# Patient Record
Sex: Female | Born: 1955 | Race: Black or African American | Hispanic: No | Marital: Married | Smoking: Former smoker
Health system: Southern US, Community
[De-identification: ages and names within clinical notes are randomized; demographics above are authoritative.]

## PROBLEM LIST (undated history)

## (undated) DIAGNOSIS — M1990 Unspecified osteoarthritis, unspecified site: Secondary | ICD-10-CM

## (undated) DIAGNOSIS — D509 Iron deficiency anemia, unspecified: Secondary | ICD-10-CM

## (undated) DIAGNOSIS — R519 Headache, unspecified: Secondary | ICD-10-CM

## (undated) DIAGNOSIS — G4730 Sleep apnea, unspecified: Secondary | ICD-10-CM

## (undated) DIAGNOSIS — R51 Headache: Secondary | ICD-10-CM

## (undated) DIAGNOSIS — I10 Essential (primary) hypertension: Secondary | ICD-10-CM

## (undated) DIAGNOSIS — F419 Anxiety disorder, unspecified: Secondary | ICD-10-CM

## (undated) DIAGNOSIS — E669 Obesity, unspecified: Secondary | ICD-10-CM

## (undated) HISTORY — PX: JOINT REPLACEMENT: SHX530

## (undated) HISTORY — PX: ABDOMINAL HYSTERECTOMY: SHX81

## (undated) HISTORY — PX: CHOLECYSTECTOMY: SHX55

## (undated) HISTORY — DX: Iron deficiency anemia, unspecified: D50.9

## (undated) HISTORY — PX: GASTRIC BYPASS: SHX52

## (undated) HISTORY — PX: COLONOSCOPY WITH ESOPHAGOGASTRODUODENOSCOPY (EGD): SHX5779

## (undated) HISTORY — PX: OTHER SURGICAL HISTORY: SHX169

## (undated) HISTORY — PX: DILATION AND CURETTAGE OF UTERUS: SHX78

---

## 1987-05-13 HISTORY — PX: BREAST EXCISIONAL BIOPSY: SUR124

## 2010-03-19 ENCOUNTER — Ambulatory Visit: Payer: Self-pay

## 2010-12-16 ENCOUNTER — Ambulatory Visit: Payer: Self-pay | Admitting: Family Medicine

## 2011-01-11 ENCOUNTER — Ambulatory Visit: Payer: Self-pay | Admitting: Family Medicine

## 2012-09-28 ENCOUNTER — Ambulatory Visit: Payer: Self-pay

## 2013-09-05 ENCOUNTER — Ambulatory Visit: Payer: Self-pay | Admitting: Obstetrics and Gynecology

## 2013-09-30 DIAGNOSIS — M1990 Unspecified osteoarthritis, unspecified site: Secondary | ICD-10-CM | POA: Insufficient documentation

## 2013-09-30 DIAGNOSIS — M545 Low back pain, unspecified: Secondary | ICD-10-CM | POA: Insufficient documentation

## 2013-10-13 ENCOUNTER — Ambulatory Visit: Payer: Self-pay | Admitting: Orthopedic Surgery

## 2014-01-03 ENCOUNTER — Ambulatory Visit: Payer: Self-pay | Admitting: Specialist

## 2014-01-24 ENCOUNTER — Ambulatory Visit: Payer: Self-pay | Admitting: Gastroenterology

## 2014-01-26 LAB — PATHOLOGY REPORT

## 2015-01-01 DIAGNOSIS — K5731 Diverticulosis of large intestine without perforation or abscess with bleeding: Secondary | ICD-10-CM | POA: Insufficient documentation

## 2015-05-30 ENCOUNTER — Other Ambulatory Visit: Payer: Self-pay | Admitting: Specialist

## 2015-06-26 ENCOUNTER — Ambulatory Visit
Admission: RE | Admit: 2015-06-26 | Discharge: 2015-06-26 | Disposition: A | Discharge status: Home / Self Care | Payer: Medicare Other | Source: Ambulatory Visit | Attending: Specialist | Admitting: Specialist

## 2015-06-26 DIAGNOSIS — Z01818 Encounter for other preprocedural examination: Secondary | ICD-10-CM | POA: Diagnosis not present

## 2015-06-26 DIAGNOSIS — K8020 Calculus of gallbladder without cholecystitis without obstruction: Secondary | ICD-10-CM | POA: Insufficient documentation

## 2015-07-04 DIAGNOSIS — M170 Bilateral primary osteoarthritis of knee: Secondary | ICD-10-CM | POA: Insufficient documentation

## 2015-07-24 DIAGNOSIS — K8020 Calculus of gallbladder without cholecystitis without obstruction: Secondary | ICD-10-CM | POA: Insufficient documentation

## 2015-07-24 DIAGNOSIS — I10 Essential (primary) hypertension: Secondary | ICD-10-CM | POA: Insufficient documentation

## 2015-07-24 DIAGNOSIS — G4733 Obstructive sleep apnea (adult) (pediatric): Secondary | ICD-10-CM | POA: Insufficient documentation

## 2015-12-13 ENCOUNTER — Encounter: Payer: Self-pay | Admitting: *Deleted

## 2015-12-14 ENCOUNTER — Encounter: Payer: Self-pay | Admitting: *Deleted

## 2015-12-14 ENCOUNTER — Ambulatory Visit
Admission: RE | Admit: 2015-12-14 | Discharge: 2015-12-14 | Disposition: A | Discharge status: Home / Self Care | Payer: Medicare Other | Source: Ambulatory Visit | Attending: Unknown Physician Specialty | Admitting: Unknown Physician Specialty

## 2015-12-14 ENCOUNTER — Encounter
Admission: RE | Disposition: A | Discharge status: Home / Self Care | Payer: Self-pay | Source: Ambulatory Visit | Attending: Unknown Physician Specialty

## 2015-12-14 ENCOUNTER — Ambulatory Visit: Payer: Medicare Other | Admitting: Anesthesiology

## 2015-12-14 DIAGNOSIS — G4730 Sleep apnea, unspecified: Secondary | ICD-10-CM | POA: Insufficient documentation

## 2015-12-14 DIAGNOSIS — Z87891 Personal history of nicotine dependence: Secondary | ICD-10-CM | POA: Diagnosis not present

## 2015-12-14 DIAGNOSIS — I10 Essential (primary) hypertension: Secondary | ICD-10-CM | POA: Diagnosis not present

## 2015-12-14 DIAGNOSIS — Z79899 Other long term (current) drug therapy: Secondary | ICD-10-CM | POA: Insufficient documentation

## 2015-12-14 DIAGNOSIS — K2960 Other gastritis without bleeding: Secondary | ICD-10-CM | POA: Diagnosis not present

## 2015-12-14 DIAGNOSIS — M1990 Unspecified osteoarthritis, unspecified site: Secondary | ICD-10-CM | POA: Diagnosis not present

## 2015-12-14 HISTORY — PX: ESOPHAGOGASTRODUODENOSCOPY (EGD) WITH PROPOFOL: SHX5813

## 2015-12-14 HISTORY — DX: Sleep apnea, unspecified: G47.30

## 2015-12-14 HISTORY — DX: Unspecified osteoarthritis, unspecified site: M19.90

## 2015-12-14 HISTORY — DX: Essential (primary) hypertension: I10

## 2015-12-14 HISTORY — DX: Obesity, unspecified: E66.9

## 2015-12-14 HISTORY — DX: Headache: R51

## 2015-12-14 HISTORY — DX: Headache, unspecified: R51.9

## 2015-12-14 SURGERY — ESOPHAGOGASTRODUODENOSCOPY (EGD) WITH PROPOFOL
Anesthesia: General

## 2015-12-14 MED ORDER — MIDAZOLAM HCL 5 MG/5ML IJ SOLN
INTRAMUSCULAR | Status: DC | PRN
  Administered 2015-12-14: 1 mg via INTRAVENOUS

## 2015-12-14 MED ORDER — SODIUM CHLORIDE 0.9 % IV SOLN
INTRAVENOUS | Status: DC
  Administered 2015-12-14: 1000 mL via INTRAVENOUS

## 2015-12-14 MED ORDER — LIDOCAINE 2% (20 MG/ML) 5 ML SYRINGE
INTRAMUSCULAR | Status: DC | PRN
  Administered 2015-12-14: 40 mg via INTRAVENOUS

## 2015-12-14 MED ORDER — FENTANYL CITRATE (PF) 100 MCG/2ML IJ SOLN
INTRAMUSCULAR | Status: DC | PRN
  Administered 2015-12-14: 50 ug via INTRAVENOUS

## 2015-12-14 MED ORDER — SODIUM CHLORIDE 0.9 % IV SOLN: INTRAVENOUS | Status: DC

## 2015-12-14 MED ORDER — PROPOFOL 500 MG/50ML IV EMUL
INTRAVENOUS | Status: DC | PRN
  Administered 2015-12-14: 150 ug/kg/min via INTRAVENOUS

## 2015-12-14 MED ORDER — PROPOFOL 10 MG/ML IV BOLUS
INTRAVENOUS | Status: DC | PRN
  Administered 2015-12-14: 100 mg via INTRAVENOUS

## 2015-12-14 NOTE — H&P (Signed)
Primary Care Physician:  Barbette Reichmann, MD Primary Gastroenterologist:  Dr. Mechele Collin  Pre-Procedure History & Physical: HPI:  Christine Reyes is a 60 y.o. female is here for an endoscopy.   Past Medical History:  Diagnosis Date  . Arthritis   . Headache   . Hypertension   . Obesity   . Sleep apnea     Past Surgical History:  Procedure Laterality Date  . ABDOMINAL HYSTERECTOMY    . COLONOSCOPY WITH ESOPHAGOGASTRODUODENOSCOPY (EGD)    . csection x2    . DILATION AND CURETTAGE OF UTERUS    . left knee surgery      Prior to Admission medications   Medication Sig Start Date End Date Taking? Authorizing Provider  cyclobenzaprine (FLEXERIL) 5 MG tablet Take 5 mg by mouth 3 (three) times daily as needed for muscle spasms.   Yes Historical Provider, MD  diclofenac sodium (VOLTAREN) 1 % GEL Apply 2 g topically 2 (two) times daily.   Yes Historical Provider, MD  ergocalciferol (VITAMIN D2) 50000 units capsule Take 50,000 Units by mouth once a week.   Yes Historical Provider, MD  ferrous sulfate 325 (65 FE) MG tablet Take 325 mg by mouth daily with breakfast.   Yes Historical Provider, MD  gabapentin (NEURONTIN) 300 MG capsule Take 300 mg by mouth 3 (three) times daily.   Yes Historical Provider, MD  ibuprofen (ADVIL,MOTRIN) 200 MG tablet Take 800 mg by mouth every 6 (six) hours as needed.   Yes Historical Provider, MD  olmesartan (BENICAR) 20 MG tablet Take 20 mg by mouth daily.   Yes Historical Provider, MD  pantoprazole (PROTONIX) 40 MG tablet Take 40 mg by mouth daily.   Yes Historical Provider, MD  predniSONE (DELTASONE) 10 MG tablet Take 10 mg by mouth daily with breakfast.   Yes Historical Provider, MD    Allergies as of 12/11/2015  . (Not on File)    History reviewed. No pertinent family history.  Social History   Social History  . Marital status: Married    Spouse name: N/A  . Number of children: N/A  . Years of education: N/A   Occupational History  . Not on  file.   Social History Main Topics  . Smoking status: Former Smoker    Packs/day: 0.50  . Smokeless tobacco: Former Neurosurgeon  . Alcohol use No  . Drug use: No  . Sexual activity: Not on file   Other Topics Concern  . Not on file   Social History Narrative  . No narrative on file    Review of Systems: See HPI, otherwise negative ROS  Physical Exam: BP (!) 181/102   Pulse (!) 101   Temp 98 F (36.7 C) (Tympanic)   Resp 20   Ht  (1.499 m)   Wt 115.7 kg (255 lb)   SpO2 97%   BMI 51.50 kg/m  General:   Alert,  pleasant and cooperative in NAD Head:  Normocephalic and atraumatic. Neck:  Supple; no masses or thyromegaly. Lungs:  Clear throughout to auscultation.    Heart:  Regular rate and rhythm. Abdomen:  Soft, nontender and nondistended. Normal bowel sounds, without guarding, and without rebound.   Neurologic:  Alert and  oriented x4;  grossly normal neurologically.  Impression/Plan: Christine Reyes is here for an endoscopy to be performed for bariatric surgery pre op evaluation  Risks, benefits, limitations, and alternatives regarding  endoscopy have been reviewed with the patient.  Questions have been answered.  All  parties agreeable.   Lynnae Prude, MD  12/14/2015, 11:21 AM

## 2015-12-14 NOTE — Anesthesia Preprocedure Evaluation (Signed)
Anesthesia Evaluation  Patient identified by MRN, date of birth, ID band Patient awake    Reviewed: Allergy & Precautions, NPO status , Unable to perform ROS - Chart review only  Airway Mallampati: III       Dental  (+) Missing   Pulmonary sleep apnea and Continuous Positive Airway Pressure Ventilation , former smoker,     + decreased breath sounds      Cardiovascular hypertension,  Rhythm:Regular     Neuro/Psych    GI/Hepatic negative GI ROS, Neg liver ROS,   Endo/Other  Morbid obesity  Renal/GU negative Renal ROS     Musculoskeletal   Abdominal (+) + obese,   Peds  Hematology negative hematology ROS (+)   Anesthesia Other Findings   Reproductive/Obstetrics                             Anesthesia Physical Anesthesia Plan  ASA: III  Anesthesia Plan: General   Post-op Pain Management:    Induction: Intravenous  Airway Management Planned: Natural Airway and Nasal Cannula  Additional Equipment:   Intra-op Plan:   Post-operative Plan:   Informed Consent: I have reviewed the patients History and Physical, chart, labs and discussed the procedure including the risks, benefits and alternatives for the proposed anesthesia with the patient or authorized representative who has indicated his/her understanding and acceptance.     Plan Discussed with: CRNA  Anesthesia Plan Comments:         Anesthesia Quick Evaluation

## 2015-12-14 NOTE — Op Note (Signed)
[facility] Gastroenterology Patient Name: Christine Reyes Procedure Date: 12/14/2015 11:20 AM MRN: [id number] Account #: [id number] Date of Birth: 1956-05-09 Admit Type: Outpatient Age: 60 Room: [facility] ENDO ROOM 1 Gender: Female Note Status: Finalized Procedure:            Upper GI endoscopy Indications:          Preoperative assessment for bariatric surgery to treat                        morbid obesity Providers:            Scot Jun, MD Referring MD:         Barbette Reichmann, MD (Referring MD) Medicines:            Propofol per Anesthesia Complications:        No immediate complications. Procedure:            Pre-Anesthesia Assessment:                       - After reviewing the risks and benefits, the patient                        was deemed in satisfactory condition to undergo the                        procedure.                       After obtaining informed consent, the endoscope was                        passed under direct vision. Throughout the procedure,                        the patient's blood pressure, pulse, and oxygen                        saturations were monitored continuously. The Endoscope                        was introduced through the mouth, and advanced to the                        second part of duodenum. The upper GI endoscopy was                        accomplished without difficulty. The patient tolerated                        the procedure well. Findings:      The examined esophagus was normal. GEJ 40cm.      Localized moderate inflammation characterized by erosions, erythema and       granularity was found on the greater curvature of the proximal gastric       antrum/distal body.. Biopsies were taken with a cold forceps for       histology. Biopsies were taken with a cold forceps for Helicobacter       pylori testing.      The examined duodenum was normal. Impression:           - Normal esophagus.   - Gastritis.  Biopsied.                       - Normal examined duodenum. Recommendation:       - Await pathology results. Take medicine for stomach. Scot Jun, MD 12/14/2015 11:39:08 AM This report has been signed electronically. Number of Addenda: 0 Note Initiated On: 12/14/2015 11:20 AM      [facility]

## 2015-12-14 NOTE — Anesthesia Postprocedure Evaluation (Signed)
Anesthesia Post Note  Patient: Christine Reyes  Procedure(s) Performed: Procedure(s) (LRB): ESOPHAGOGASTRODUODENOSCOPY (EGD) WITH PROPOFOL (N/A)  Patient location during evaluation: PACU Anesthesia Type: General Level of consciousness: awake Pain management: pain level controlled Vital Signs Assessment: post-procedure vital signs reviewed and stable Respiratory status: nonlabored ventilation Cardiovascular status: stable Anesthetic complications: no    Last Vitals:  Vitals:   12/14/15 1144 12/14/15 1150  BP: (!) 146/93 (!) 169/108  Pulse: 96 92  Resp: 20 15  Temp: (!) 35.9 C     Last Pain:  Vitals:   12/14/15 1140  TempSrc: Tympanic                 VAN STAVEREN,Kenechukwu Eckstein

## 2015-12-14 NOTE — Transfer of Care (Signed)
Immediate Anesthesia Transfer of Care Note  Patient: Christine Reyes  Procedure(s) Performed: Procedure(s): ESOPHAGOGASTRODUODENOSCOPY (EGD) WITH PROPOFOL (N/A)  Patient Location: PACU and Endoscopy Unit  Anesthesia Type:General  Level of Consciousness: awake, oriented and patient cooperative  Airway & Oxygen Therapy: Patient Spontanous Breathing and Patient connected to nasal cannula oxygen  Post-op Assessment: Report given to RN and Post -op Vital signs reviewed and stable  Post vital signs: Reviewed and stable  Last Vitals:  Vitals:   12/14/15 1042  BP: (!) 181/102  Pulse: (!) 101  Resp: 20  Temp: 36.7 C    Last Pain:  Vitals:   12/14/15 1042  TempSrc: Tympanic         Complications: No apparent anesthesia complications

## 2015-12-17 ENCOUNTER — Encounter: Payer: Self-pay | Admitting: Unknown Physician Specialty

## 2015-12-17 LAB — SURGICAL PATHOLOGY

## 2016-03-19 DIAGNOSIS — J90 Pleural effusion, not elsewhere classified: Secondary | ICD-10-CM | POA: Insufficient documentation

## 2016-03-20 DIAGNOSIS — K219 Gastro-esophageal reflux disease without esophagitis: Secondary | ICD-10-CM | POA: Insufficient documentation

## 2016-03-20 DIAGNOSIS — K591 Functional diarrhea: Secondary | ICD-10-CM | POA: Insufficient documentation

## 2016-06-11 ENCOUNTER — Other Ambulatory Visit: Payer: Self-pay | Admitting: Orthopedic Surgery

## 2016-06-11 DIAGNOSIS — M545 Low back pain: Secondary | ICD-10-CM

## 2016-06-24 ENCOUNTER — Ambulatory Visit
Admission: RE | Admit: 2016-06-24 | Discharge: 2016-06-24 | Disposition: A | Discharge status: Home / Self Care | Payer: Medicare Other | Source: Ambulatory Visit | Attending: Orthopedic Surgery | Admitting: Orthopedic Surgery

## 2016-06-24 ENCOUNTER — Encounter: Payer: Self-pay | Admitting: Radiology

## 2016-06-24 DIAGNOSIS — M5136 Other intervertebral disc degeneration, lumbar region: Secondary | ICD-10-CM | POA: Diagnosis not present

## 2016-06-24 DIAGNOSIS — M48061 Spinal stenosis, lumbar region without neurogenic claudication: Secondary | ICD-10-CM | POA: Insufficient documentation

## 2016-06-24 DIAGNOSIS — M545 Low back pain: Secondary | ICD-10-CM | POA: Diagnosis present

## 2016-12-17 ENCOUNTER — Other Ambulatory Visit: Payer: Self-pay | Admitting: Obstetrics and Gynecology

## 2016-12-17 DIAGNOSIS — Z1231 Encounter for screening mammogram for malignant neoplasm of breast: Secondary | ICD-10-CM

## 2017-03-19 ENCOUNTER — Ambulatory Visit
Admission: RE | Admit: 2017-03-19 | Discharge: 2017-03-19 | Disposition: A | Discharge status: Home / Self Care | Payer: Medicare Other | Source: Ambulatory Visit | Attending: Obstetrics and Gynecology | Admitting: Obstetrics and Gynecology

## 2017-03-19 DIAGNOSIS — Z1231 Encounter for screening mammogram for malignant neoplasm of breast: Secondary | ICD-10-CM | POA: Insufficient documentation

## 2017-03-26 ENCOUNTER — Other Ambulatory Visit: Payer: Self-pay | Admitting: Orthopedic Surgery

## 2017-03-26 DIAGNOSIS — M170 Bilateral primary osteoarthritis of knee: Secondary | ICD-10-CM

## 2017-04-01 ENCOUNTER — Ambulatory Visit: Payer: Medicare Other

## 2017-04-13 ENCOUNTER — Ambulatory Visit: Admission: RE | Admit: 2017-04-13 | Payer: Medicare Other | Source: Ambulatory Visit | Admitting: Orthopedic Surgery

## 2017-04-24 ENCOUNTER — Ambulatory Visit
Admission: RE | Admit: 2017-04-24 | Discharge: 2017-04-24 | Disposition: A | Discharge status: Home / Self Care | Payer: Medicare Other | Source: Ambulatory Visit | Attending: Orthopedic Surgery | Admitting: Orthopedic Surgery

## 2017-04-24 DIAGNOSIS — M170 Bilateral primary osteoarthritis of knee: Secondary | ICD-10-CM

## 2017-05-27 ENCOUNTER — Inpatient Hospital Stay: Admission: RE | Admit: 2017-05-27 | Payer: Medicare Other | Source: Ambulatory Visit

## 2017-06-09 ENCOUNTER — Ambulatory Visit: Payer: Self-pay | Admitting: Orthopedic Surgery

## 2017-06-10 ENCOUNTER — Encounter
Admission: RE | Admit: 2017-06-10 | Discharge: 2017-06-10 | Disposition: A | Discharge status: Home / Self Care | Payer: Medicare Other | Source: Ambulatory Visit | Attending: Orthopedic Surgery | Admitting: Orthopedic Surgery

## 2017-06-10 ENCOUNTER — Other Ambulatory Visit: Payer: Self-pay

## 2017-06-10 DIAGNOSIS — Z01812 Encounter for preprocedural laboratory examination: Secondary | ICD-10-CM | POA: Diagnosis not present

## 2017-06-10 HISTORY — DX: Anxiety disorder, unspecified: F41.9

## 2017-06-10 LAB — URINALYSIS, ROUTINE W REFLEX MICROSCOPIC
Bilirubin Urine: NEGATIVE
GLUCOSE, UA: NEGATIVE mg/dL
Hgb urine dipstick: NEGATIVE
Ketones, ur: NEGATIVE mg/dL
LEUKOCYTES UA: NEGATIVE
Nitrite: NEGATIVE
PROTEIN: NEGATIVE mg/dL
Specific Gravity, Urine: 1.021 (ref 1.005–1.030)
pH: 5 (ref 5.0–8.0)

## 2017-06-10 LAB — BASIC METABOLIC PANEL
Anion gap: 13 (ref 5–15)
BUN: 19 mg/dL (ref 6–20)
CHLORIDE: 106 mmol/L (ref 101–111)
CO2: 23 mmol/L (ref 22–32)
CREATININE: 0.93 mg/dL (ref 0.44–1.00)
Calcium: 9.5 mg/dL (ref 8.9–10.3)
GFR calc non Af Amer: >60 mL/min (ref >60)
Glucose, Bld: 79 mg/dL (ref 65–99)
POTASSIUM: 4 mmol/L (ref 3.5–5.1)
SODIUM: 142 mmol/L (ref 135–145)

## 2017-06-10 LAB — CBC
HCT: 36.6 % (ref 35.0–47.0)
Hemoglobin: 12 g/dL (ref 12.0–16.0)
MCH: 31.1 pg (ref 26.0–34.0)
MCHC: 32.7 g/dL (ref 32.0–36.0)
MCV: 95 fL (ref 80.0–100.0)
Platelets: 345 10*3/uL (ref 150–440)
RBC: 3.85 MIL/uL (ref 3.80–5.20)
RDW: 13.3 % (ref 11.5–14.5)
WBC: 9.1 10*3/uL (ref 3.6–11.0)

## 2017-06-10 LAB — SURGICAL PCR SCREEN
MRSA, PCR: NEGATIVE
Staphylococcus aureus: NEGATIVE

## 2017-06-10 LAB — TYPE AND SCREEN
ABO/RH(D): B NEG
ANTIBODY SCREEN: NEGATIVE

## 2017-06-10 NOTE — Pre-Procedure Instructions (Signed)
Instructed regarding incentive spirometry. 

## 2017-06-10 NOTE — Pre-Procedure Instructions (Addendum)
    Medical clearance on chart.  EKG done today revealed SR with no acute findings  Pt is stable to undergo surgery and risk of peri op complications is low.

## 2017-06-10 NOTE — Patient Instructions (Signed)
Your procedure is scheduled on: 06/22/17 Mon Report to Same Day Surgery 2nd floor medical mall [facility] Entrance-take elevator on left to 2nd floor.  Check in with surgery information desk.) To find out your arrival time please call [phone] between 1PM - 3PM on 06/17/17 Fri  Remember: Instructions that are not followed completely may result in serious medical risk, up to and including death, or upon the discretion of your surgeon and anesthesiologist your surgery may need to be rescheduled.    _x___ 1. Do not eat food after midnight the night before your procedure. You may drink clear liquids up to 2 hours before you are scheduled to arrive at the hospital for your procedure.  Do not drink clear liquids within 2 hours of your scheduled arrival to the hospital.  Clear liquids include  --Water or Apple juice without pulp  --Clear carbohydrate beverage such as ClearFast or Gatorade  --Black Coffee or Clear Tea (No milk, no creamers, do not add anything to                  the coffee or Tea Type 1 and type 2 diabetics should only drink water.  No gum chewing or hard candies.     __x__ 2. No Alcohol for 24 hours before or after surgery.   __x__3. No Smoking for 24 prior to surgery.   ____  4. Bring all medications with you on the day of surgery if instructed.    __x__ 5. Notify your doctor if there is any change in your medical condition     (cold, fever, infections).     Do not wear jewelry, make-up, hairpins, clips or nail polish.  Do not wear lotions, powders, or perfumes. You may wear deodorant.  Do not shave 48 hours prior to surgery. Men may shave face and neck.  Do not bring valuables to the hospital.    [facility] is not responsible for any belongings or valuables.               Contacts, dentures or bridgework may not be worn into surgery.  Leave your suitcase in the car. After surgery it may be brought to your room.  For patients admitted to the hospital, discharge  time is determined by your                       treatment team.   Patients discharged the day of surgery will not be allowed to drive home.  You will need someone to drive you home and stay with you the night of your procedure.    Please read over the following fact sheets that you were given:   [facility] Preparing for Surgery and or MRSA Information   _x___ Take anti-hypertensive listed below, cardiac, seizure, asthma,     anti-reflux and psychiatric medicines. These include:  1. gabapentin (NEURONTIN) 300 MG capsule  2.pantoprazole (PROTONIX) 40 MG tablet  3.  4.  5.  6.  ____Fleets enema or Magnesium Citrate as directed.   _x___ Use CHG Soap or sage wipes as directed on instruction sheet   ____ Use inhalers on the day of surgery and bring to hospital day of surgery  ____ Stop Metformin and Janumet 2 days prior to surgery.    ____ Take 1/2 of usual insulin dose the night before surgery and none on the morning     surgery.   _x___ Follow recommendations from Cardiologist, Pulmonologist or PCP regarding  stopping Aspirin, Coumadin, Plavix ,Eliquis, Effient, or Pradaxa, and Pletal.  X____Stop Anti-inflammatories such as Advil, Aleve, Ibuprofen, Motrin, Naproxen, Naprosyn, Goodies powders or aspirin products. OK to take Tylenol and  Celebrex.  Stop Meloxicam 1 week before surgery.   _x___ Stop supplements until after surgery.  But may continue Vitamin D, Vitamin B,       and multivitamin.   ____ Bring C-Pap to the hospital.

## 2017-06-21 MED ORDER — SODIUM CHLORIDE 0.9 % IV SOLN
1000.0000 mg | INTRAVENOUS | Status: DC
  Filled 2017-06-21: qty 10

## 2017-06-21 MED ORDER — CEFAZOLIN SODIUM-DEXTROSE 2-4 GM/100ML-% IV SOLN
2.0000 g | INTRAVENOUS | Status: AC
  Administered 2017-06-22: 2 g via INTRAVENOUS

## 2017-06-22 ENCOUNTER — Other Ambulatory Visit: Payer: Self-pay

## 2017-06-22 ENCOUNTER — Inpatient Hospital Stay: Payer: Medicare Other | Admitting: Anesthesiology

## 2017-06-22 ENCOUNTER — Encounter
Admission: RE | Disposition: A | Discharge status: Skilled Nursing Facility | Payer: Self-pay | Source: Ambulatory Visit | Attending: Orthopedic Surgery

## 2017-06-22 ENCOUNTER — Inpatient Hospital Stay
Admission: RE | Admit: 2017-06-22 | Discharge: 2017-06-24 | DRG: 470 | Disposition: A | Discharge status: Skilled Nursing Facility | Payer: Medicare Other | Source: Ambulatory Visit | Attending: Orthopedic Surgery | Admitting: Orthopedic Surgery

## 2017-06-22 ENCOUNTER — Encounter: Payer: Self-pay | Admitting: Anesthesiology

## 2017-06-22 ENCOUNTER — Inpatient Hospital Stay: Payer: Medicare Other

## 2017-06-22 DIAGNOSIS — I10 Essential (primary) hypertension: Secondary | ICD-10-CM | POA: Diagnosis present

## 2017-06-22 DIAGNOSIS — Z90710 Acquired absence of both cervix and uterus: Secondary | ICD-10-CM | POA: Diagnosis not present

## 2017-06-22 DIAGNOSIS — Z6841 Body Mass Index (BMI) 40.0 and over, adult: Secondary | ICD-10-CM | POA: Diagnosis not present

## 2017-06-22 DIAGNOSIS — K08409 Partial loss of teeth, unspecified cause, unspecified class: Secondary | ICD-10-CM | POA: Diagnosis present

## 2017-06-22 DIAGNOSIS — Z09 Encounter for follow-up examination after completed treatment for conditions other than malignant neoplasm: Secondary | ICD-10-CM

## 2017-06-22 DIAGNOSIS — Z9049 Acquired absence of other specified parts of digestive tract: Secondary | ICD-10-CM | POA: Diagnosis not present

## 2017-06-22 DIAGNOSIS — M25761 Osteophyte, right knee: Secondary | ICD-10-CM | POA: Diagnosis present

## 2017-06-22 DIAGNOSIS — Z96651 Presence of right artificial knee joint: Secondary | ICD-10-CM

## 2017-06-22 DIAGNOSIS — Z8261 Family history of arthritis: Secondary | ICD-10-CM

## 2017-06-22 DIAGNOSIS — G4733 Obstructive sleep apnea (adult) (pediatric): Secondary | ICD-10-CM | POA: Diagnosis present

## 2017-06-22 DIAGNOSIS — Z87891 Personal history of nicotine dependence: Secondary | ICD-10-CM | POA: Diagnosis not present

## 2017-06-22 DIAGNOSIS — Z8249 Family history of ischemic heart disease and other diseases of the circulatory system: Secondary | ICD-10-CM | POA: Diagnosis not present

## 2017-06-22 DIAGNOSIS — K0889 Other specified disorders of teeth and supporting structures: Secondary | ICD-10-CM | POA: Diagnosis present

## 2017-06-22 DIAGNOSIS — K219 Gastro-esophageal reflux disease without esophagitis: Secondary | ICD-10-CM | POA: Diagnosis present

## 2017-06-22 DIAGNOSIS — Z885 Allergy status to narcotic agent status: Secondary | ICD-10-CM | POA: Diagnosis not present

## 2017-06-22 DIAGNOSIS — Z79899 Other long term (current) drug therapy: Secondary | ICD-10-CM | POA: Diagnosis not present

## 2017-06-22 DIAGNOSIS — M170 Bilateral primary osteoarthritis of knee: Principal | ICD-10-CM | POA: Diagnosis present

## 2017-06-22 HISTORY — PX: TOTAL KNEE ARTHROPLASTY: SHX125

## 2017-06-22 LAB — ABO/RH: ABO/RH(D): B NEG

## 2017-06-22 SURGERY — ARTHROPLASTY, KNEE, TOTAL
Anesthesia: Spinal | Site: Knee | Laterality: Right | Wound class: Clean

## 2017-06-22 MED ORDER — BUPIVACAINE LIPOSOME 1.3 % IJ SUSP
INTRAMUSCULAR | Status: DC | PRN
  Administered 2017-06-22: 60 mL

## 2017-06-22 MED ORDER — OXYCODONE HCL 5 MG PO TABS
10.0000 mg | ORAL_TABLET | ORAL | Status: DC | PRN
  Administered 2017-06-23 – 2017-06-24 (×6): 10 mg via ORAL
  Filled 2017-06-22 (×6): qty 2

## 2017-06-22 MED ORDER — ROPIVACAINE HCL 5 MG/ML IJ SOLN
INTRAMUSCULAR | Status: DC | PRN
  Administered 2017-06-22: 20 mL via PERINEURAL
  Administered 2017-06-22: 10 mL via PERINEURAL

## 2017-06-22 MED ORDER — CEFAZOLIN SODIUM-DEXTROSE 2-4 GM/100ML-% IV SOLN
INTRAVENOUS | Status: AC
  Filled 2017-06-22: qty 100

## 2017-06-22 MED ORDER — LACTATED RINGERS IV SOLN
INTRAVENOUS | Status: DC
  Administered 2017-06-22: 11:00:00 via INTRAVENOUS

## 2017-06-22 MED ORDER — ACETAMINOPHEN 325 MG PO TABS
ORAL_TABLET | ORAL | Status: AC
  Filled 2017-06-22: qty 2

## 2017-06-22 MED ORDER — OXYCODONE HCL 5 MG PO TABS: 5.0000 mg | ORAL_TABLET | Freq: Once | ORAL | Status: DC | PRN

## 2017-06-22 MED ORDER — MAGNESIUM HYDROXIDE 400 MG/5ML PO SUSP
30.0000 mL | Freq: Every day | ORAL | Status: DC | PRN
  Administered 2017-06-24: 30 mL via ORAL
  Filled 2017-06-22: qty 30

## 2017-06-22 MED ORDER — BACITRACIN 50000 UNITS IM SOLR
INTRAMUSCULAR | Status: AC
  Filled 2017-06-22: qty 2

## 2017-06-22 MED ORDER — LIDOCAINE HCL (PF) 1 % IJ SOLN
INTRAMUSCULAR | Status: AC
  Filled 2017-06-22: qty 5

## 2017-06-22 MED ORDER — OXYCODONE HCL 5 MG/5ML PO SOLN: 5.0000 mg | Freq: Once | ORAL | Status: DC | PRN

## 2017-06-22 MED ORDER — MAGNESIUM GLUCONATE 500 MG PO TABS
500.0000 mg | ORAL_TABLET | Freq: Every day | ORAL | Status: DC
  Administered 2017-06-22 – 2017-06-24 (×3): 500 mg via ORAL
  Filled 2017-06-22 (×3): qty 1

## 2017-06-22 MED ORDER — METOCLOPRAMIDE HCL 5 MG/ML IJ SOLN: 5.0000 mg | Freq: Three times a day (TID) | INTRAMUSCULAR | Status: DC | PRN

## 2017-06-22 MED ORDER — SODIUM CHLORIDE 0.9 % IV SOLN
INTRAVENOUS | Status: DC | PRN
  Administered 2017-06-22: 50 ug/min via INTRAVENOUS

## 2017-06-22 MED ORDER — KETOROLAC TROMETHAMINE 15 MG/ML IJ SOLN
15.0000 mg | Freq: Four times a day (QID) | INTRAMUSCULAR | Status: AC
  Administered 2017-06-22 – 2017-06-23 (×4): 15 mg via INTRAVENOUS
  Filled 2017-06-22 (×6): qty 1

## 2017-06-22 MED ORDER — MAGNESIUM CITRATE PO SOLN
1.0000 | Freq: Once | ORAL | Status: AC | PRN
  Administered 2017-06-24: 1 via ORAL
  Filled 2017-06-22 (×2): qty 296

## 2017-06-22 MED ORDER — PHENOL 1.4 % MT LIQD
1.0000 | OROMUCOSAL | Status: DC | PRN
  Filled 2017-06-22: qty 177

## 2017-06-22 MED ORDER — DOCUSATE SODIUM 100 MG PO CAPS
100.0000 mg | ORAL_CAPSULE | Freq: Two times a day (BID) | ORAL | Status: DC
  Administered 2017-06-22 – 2017-06-24 (×4): 100 mg via ORAL
  Filled 2017-06-22 (×4): qty 1

## 2017-06-22 MED ORDER — PROPOFOL 500 MG/50ML IV EMUL
INTRAVENOUS | Status: AC
  Filled 2017-06-22: qty 50

## 2017-06-22 MED ORDER — EPHEDRINE SULFATE 50 MG/ML IJ SOLN
INTRAMUSCULAR | Status: DC | PRN
  Administered 2017-06-22: 5 mg via INTRAVENOUS
  Administered 2017-06-22: 10 mg via INTRAVENOUS

## 2017-06-22 MED ORDER — HYDROCHLOROTHIAZIDE 25 MG PO TABS
25.0000 mg | ORAL_TABLET | Freq: Every day | ORAL | Status: DC
  Administered 2017-06-23 – 2017-06-24 (×2): 25 mg via ORAL
  Filled 2017-06-22 (×2): qty 1

## 2017-06-22 MED ORDER — PROPOFOL 10 MG/ML IV BOLUS
INTRAVENOUS | Status: DC | PRN
  Administered 2017-06-22: 40 mg via INTRAVENOUS

## 2017-06-22 MED ORDER — BUPIVACAINE-EPINEPHRINE 0.5% -1:200000 IJ SOLN
INTRAMUSCULAR | Status: DC | PRN
  Administered 2017-06-22: 30 mL

## 2017-06-22 MED ORDER — MENTHOL 3 MG MT LOZG
1.0000 | LOZENGE | OROMUCOSAL | Status: DC | PRN
  Filled 2017-06-22: qty 9

## 2017-06-22 MED ORDER — FENTANYL CITRATE (PF) 100 MCG/2ML IJ SOLN
50.0000 ug | Freq: Once | INTRAMUSCULAR | Status: AC
  Administered 2017-06-22: 50 ug via INTRAVENOUS

## 2017-06-22 MED ORDER — SODIUM CHLORIDE 0.9 % IR SOLN
Status: DC | PRN
  Administered 2017-06-22: 2000 mL

## 2017-06-22 MED ORDER — METOCLOPRAMIDE HCL 10 MG PO TABS: 5.0000 mg | ORAL_TABLET | Freq: Three times a day (TID) | ORAL | Status: DC | PRN

## 2017-06-22 MED ORDER — LACTATED RINGERS IV SOLN: INTRAVENOUS | Status: DC

## 2017-06-22 MED ORDER — BUPIVACAINE LIPOSOME 1.3 % IJ SUSP
INTRAMUSCULAR | Status: AC
  Filled 2017-06-22: qty 20

## 2017-06-22 MED ORDER — FENTANYL CITRATE (PF) 100 MCG/2ML IJ SOLN
INTRAMUSCULAR | Status: AC
  Administered 2017-06-22: 50 ug via INTRAVENOUS
  Filled 2017-06-22: qty 2

## 2017-06-22 MED ORDER — SENNA 8.6 MG PO TABS
1.0000 | ORAL_TABLET | Freq: Two times a day (BID) | ORAL | Status: DC
  Administered 2017-06-22 – 2017-06-24 (×4): 8.6 mg via ORAL
  Filled 2017-06-22 (×4): qty 1

## 2017-06-22 MED ORDER — ACETAMINOPHEN 650 MG RE SUPP: 650.0000 mg | RECTAL | Status: DC | PRN

## 2017-06-22 MED ORDER — BISACODYL 10 MG RE SUPP
10.0000 mg | Freq: Every day | RECTAL | Status: DC | PRN
  Administered 2017-06-24: 10 mg via RECTAL
  Filled 2017-06-22: qty 1

## 2017-06-22 MED ORDER — ACETAMINOPHEN 325 MG PO TABS
650.0000 mg | ORAL_TABLET | ORAL | Status: DC | PRN
  Administered 2017-06-24: 650 mg via ORAL
  Filled 2017-06-22: qty 2

## 2017-06-22 MED ORDER — ACETAMINOPHEN 500 MG PO TABS
1000.0000 mg | ORAL_TABLET | Freq: Four times a day (QID) | ORAL | Status: AC
  Administered 2017-06-22 – 2017-06-23 (×4): 1000 mg via ORAL
  Filled 2017-06-22 (×4): qty 2

## 2017-06-22 MED ORDER — CEFAZOLIN SODIUM-DEXTROSE 2-4 GM/100ML-% IV SOLN
2.0000 g | Freq: Four times a day (QID) | INTRAVENOUS | Status: AC
  Administered 2017-06-22 (×2): 2 g via INTRAVENOUS
  Filled 2017-06-22 (×2): qty 100

## 2017-06-22 MED ORDER — BUPIVACAINE HCL (PF) 0.5 % IJ SOLN
INTRAMUSCULAR | Status: AC
  Filled 2017-06-22: qty 10

## 2017-06-22 MED ORDER — PHENYLEPHRINE HCL 10 MG/ML IJ SOLN
INTRAMUSCULAR | Status: DC | PRN
  Administered 2017-06-22 (×4): 100 ug via INTRAVENOUS

## 2017-06-22 MED ORDER — OLMESARTAN MEDOXOMIL-HCTZ 40-25 MG PO TABS: 1.0000 | ORAL_TABLET | Freq: Every day | ORAL | Status: DC

## 2017-06-22 MED ORDER — SODIUM CHLORIDE 0.9 % IJ SOLN
INTRAMUSCULAR | Status: AC
  Filled 2017-06-22: qty 50

## 2017-06-22 MED ORDER — ONDANSETRON HCL 4 MG/2ML IJ SOLN
4.0000 mg | Freq: Four times a day (QID) | INTRAMUSCULAR | Status: DC | PRN
  Administered 2017-06-22 – 2017-06-23 (×2): 4 mg via INTRAVENOUS
  Filled 2017-06-22: qty 2

## 2017-06-22 MED ORDER — ASPIRIN EC 325 MG PO TBEC
325.0000 mg | DELAYED_RELEASE_TABLET | Freq: Every day | ORAL | Status: DC
  Administered 2017-06-23 – 2017-06-24 (×2): 325 mg via ORAL
  Filled 2017-06-22 (×2): qty 1

## 2017-06-22 MED ORDER — MIDAZOLAM HCL 2 MG/2ML IJ SOLN
INTRAMUSCULAR | Status: AC
  Administered 2017-06-22: 1 mg via INTRAVENOUS
  Filled 2017-06-22: qty 2

## 2017-06-22 MED ORDER — BUPIVACAINE HCL (PF) 0.5 % IJ SOLN
INTRAMUSCULAR | Status: DC | PRN
  Administered 2017-06-22: 2.5 mL

## 2017-06-22 MED ORDER — GABAPENTIN 300 MG PO CAPS
300.0000 mg | ORAL_CAPSULE | Freq: Three times a day (TID) | ORAL | Status: DC
  Administered 2017-06-22 – 2017-06-24 (×6): 300 mg via ORAL
  Filled 2017-06-22 (×6): qty 1

## 2017-06-22 MED ORDER — TRANEXAMIC ACID 1000 MG/10ML IV SOLN
INTRAVENOUS | Status: DC | PRN
  Administered 2017-06-22: 1000 mg via INTRAVENOUS

## 2017-06-22 MED ORDER — MIDAZOLAM HCL 2 MG/2ML IJ SOLN
1.0000 mg | Freq: Once | INTRAMUSCULAR | Status: AC
  Administered 2017-06-22: 1 mg via INTRAVENOUS

## 2017-06-22 MED ORDER — KETOROLAC TROMETHAMINE 15 MG/ML IJ SOLN
INTRAMUSCULAR | Status: AC
  Filled 2017-06-22: qty 1

## 2017-06-22 MED ORDER — ROPIVACAINE HCL 5 MG/ML IJ SOLN
INTRAMUSCULAR | Status: AC
  Filled 2017-06-22: qty 30

## 2017-06-22 MED ORDER — ACETAMINOPHEN 500 MG PO TABS
1000.0000 mg | ORAL_TABLET | Freq: Once | ORAL | Status: AC
  Administered 2017-06-22: 1000 mg via ORAL

## 2017-06-22 MED ORDER — MIDAZOLAM HCL 2 MG/2ML IJ SOLN
INTRAMUSCULAR | Status: AC
  Filled 2017-06-22: qty 2

## 2017-06-22 MED ORDER — ACETAMINOPHEN 500 MG PO TABS
ORAL_TABLET | ORAL | Status: AC
  Filled 2017-06-22: qty 2

## 2017-06-22 MED ORDER — LACTATED RINGERS IV SOLN
INTRAVENOUS | Status: DC
  Administered 2017-06-22: 09:00:00 via INTRAVENOUS

## 2017-06-22 MED ORDER — PANTOPRAZOLE SODIUM 40 MG PO TBEC
40.0000 mg | DELAYED_RELEASE_TABLET | Freq: Two times a day (BID) | ORAL | Status: DC | PRN
  Administered 2017-06-22 – 2017-06-23 (×2): 40 mg via ORAL
  Filled 2017-06-22 (×2): qty 1

## 2017-06-22 MED ORDER — PROPOFOL 500 MG/50ML IV EMUL
INTRAVENOUS | Status: DC | PRN
  Administered 2017-06-22: 50 ug/kg/min via INTRAVENOUS

## 2017-06-22 MED ORDER — HYDROMORPHONE HCL 1 MG/ML IJ SOLN
0.5000 mg | INTRAMUSCULAR | Status: DC | PRN
  Administered 2017-06-23 – 2017-06-24 (×2): 0.5 mg via INTRAVENOUS
  Filled 2017-06-22 (×2): qty 1

## 2017-06-22 MED ORDER — OXYCODONE HCL 5 MG PO TABS
5.0000 mg | ORAL_TABLET | ORAL | Status: DC | PRN
  Administered 2017-06-22 – 2017-06-24 (×4): 5 mg via ORAL
  Filled 2017-06-22 (×4): qty 1

## 2017-06-22 MED ORDER — ONDANSETRON HCL 4 MG/2ML IJ SOLN
INTRAMUSCULAR | Status: AC
  Administered 2017-06-22: 4 mg via INTRAVENOUS
  Filled 2017-06-22: qty 2

## 2017-06-22 MED ORDER — FENTANYL CITRATE (PF) 100 MCG/2ML IJ SOLN: 25.0000 ug | INTRAMUSCULAR | Status: DC | PRN

## 2017-06-22 MED ORDER — ONDANSETRON HCL 4 MG PO TABS: 4.0000 mg | ORAL_TABLET | Freq: Four times a day (QID) | ORAL | Status: DC | PRN

## 2017-06-22 MED ORDER — CHLORHEXIDINE GLUCONATE 4 % EX LIQD: 60.0000 mL | Freq: Once | CUTANEOUS | Status: DC

## 2017-06-22 MED ORDER — IRBESARTAN 150 MG PO TABS
300.0000 mg | ORAL_TABLET | Freq: Every day | ORAL | Status: DC
  Administered 2017-06-23 – 2017-06-24 (×2): 300 mg via ORAL
  Filled 2017-06-22 (×2): qty 2

## 2017-06-22 MED ORDER — BUPIVACAINE-EPINEPHRINE (PF) 0.5% -1:200000 IJ SOLN
INTRAMUSCULAR | Status: AC
  Filled 2017-06-22: qty 30

## 2017-06-22 MED ORDER — LIDOCAINE HCL (PF) 1 % IJ SOLN
INTRAMUSCULAR | Status: DC | PRN
  Administered 2017-06-22: 1 mL via INTRADERMAL

## 2017-06-22 SURGICAL SUPPLY — 49 items
BLADE SAW 1 (BLADE) ×2 IMPLANT
BLADE SAW 1/2 (BLADE) ×2 IMPLANT
BOWL CEMENT MIX W/ADAPTER (MISCELLANEOUS) ×2 IMPLANT
BRUSH SCRUB EZ  4% CHG (MISCELLANEOUS) ×2
BRUSH SCRUB EZ 4% CHG (MISCELLANEOUS) ×2 IMPLANT
CANISTER SUCT 1200ML W/VALVE (MISCELLANEOUS) ×2 IMPLANT
CANISTER SUCT 3000ML PPV (MISCELLANEOUS) ×4 IMPLANT
CAP KNEE TOTAL 3 SIGMA ×2 IMPLANT
CEMENT BONE 1-PACK (Cement) ×4 IMPLANT
CHLORAPREP W/TINT 26ML (MISCELLANEOUS) ×4 IMPLANT
COOLER POLAR GLACIER W/PUMP (MISCELLANEOUS) ×2 IMPLANT
CUFF TOURN 30 STER DUAL PORT (MISCELLANEOUS) ×2 IMPLANT
DRAPE INCISE IOBAN 66X60 STRL (DRAPES) ×2 IMPLANT
DRAPE SHEET LG 3/4 BI-LAMINATE (DRAPES) ×2 IMPLANT
ELECT REM PT RETURN 9FT ADLT (ELECTROSURGICAL) ×2
ELECTRODE REM PT RTRN 9FT ADLT (ELECTROSURGICAL) ×1 IMPLANT
GAUZE PETRO XEROFOAM 1X8 (MISCELLANEOUS) ×2 IMPLANT
GLOVE INDICATOR 8.0 STRL GRN (GLOVE) ×2 IMPLANT
GLOVE SURG ORTHO 8.0 STRL STRW (GLOVE) ×6 IMPLANT
GOWN STRL REUS W/ TWL LRG LVL3 (GOWN DISPOSABLE) ×1 IMPLANT
GOWN STRL REUS W/ TWL XL LVL3 (GOWN DISPOSABLE) ×1 IMPLANT
GOWN STRL REUS W/TWL LRG LVL3 (GOWN DISPOSABLE) ×1
GOWN STRL REUS W/TWL XL LVL3 (GOWN DISPOSABLE) ×1
GUIDE PIN FEMORAL TRUMATCH (PIN) ×2
HOOD PEEL AWAY FLYTE STAYCOOL (MISCELLANEOUS) ×6 IMPLANT
IV NS 1000ML (IV SOLUTION) ×1
IV NS 1000ML BAXH (IV SOLUTION) ×1 IMPLANT
KIT TURNOVER KIT A (KITS) ×2 IMPLANT
NDL SAFETY ECLIPSE 18X1.5 (NEEDLE) ×1 IMPLANT
NEEDLE HYPO 18GX1.5 SHARP (NEEDLE) ×1
NEEDLE SPNL 20GX3.5 QUINCKE YW (NEEDLE) ×2 IMPLANT
NS IRRIG 1000ML POUR BTL (IV SOLUTION) ×2 IMPLANT
PACK TOTAL KNEE (MISCELLANEOUS) ×2 IMPLANT
PAD DE MAYO PRESSURE PROTECT (MISCELLANEOUS) ×4 IMPLANT
PAD WRAPON POLAR KNEE (MISCELLANEOUS) ×1 IMPLANT
PIN GUIDE FEMORAL TRUMATCH (PIN) ×1 IMPLANT
PULSAVAC PLUS IRRIG FAN TIP (DISPOSABLE) ×2
STAPLER SKIN PROX 35W (STAPLE) ×2 IMPLANT
SUCTION FRAZIER HANDLE 10FR (MISCELLANEOUS) ×1
SUCTION TUBE FRAZIER 10FR DISP (MISCELLANEOUS) ×1 IMPLANT
SUT DVC 2 QUILL PDO  T11 36X36 (SUTURE) ×1
SUT DVC 2 QUILL PDO T11 36X36 (SUTURE) ×1 IMPLANT
SUT VIC AB 2-0 CT1 18 (SUTURE) ×2 IMPLANT
SUT VIC AB 2-0 CT1 27 (SUTURE) ×1
SUT VIC AB 2-0 CT1 TAPERPNT 27 (SUTURE) ×1 IMPLANT
SUT VIC AB PLUS 45CM 1-MO-4 (SUTURE) ×2 IMPLANT
SYR 30ML LL (SYRINGE) ×6 IMPLANT
TIP FAN IRRIG PULSAVAC PLUS (DISPOSABLE) ×1 IMPLANT
WRAPON POLAR PAD KNEE (MISCELLANEOUS) ×2

## 2017-06-22 NOTE — Anesthesia Procedure Notes (Signed)
Anesthesia Regional Block: Adductor canal block   Pre-Anesthetic Checklist: ,, timeout performed, Correct Patient, Correct Site, Correct Laterality, Correct Procedure, Correct Position, site marked, Risks and benefits discussed,  Surgical consent,  Pre-op evaluation,  At surgeon's request and post-op pain management  Laterality: Lower and Right  Prep: chloraprep       Needles:  Injection technique: Single-shot  Needle Type: Echogenic Needle     Needle Length: 9cm  Needle Gauge: 21     Additional Needles:   Procedures:,,,, ultrasound used (permanent image in chart),,,,  Narrative:  Start time: 06/22/2017 10:09 AM End time: 06/22/2017 10:12 AM Injection made incrementally with aspirations every 5 mL.  Performed by: Personally  Anesthesiologist: Taquita Demby, Cleda MccreedyJoseph K, MD  Additional Notes: Functioning IV was confirmed and monitors were applied.  A echogenic needle was used. Sterile prep,hand hygiene and sterile gloves were used. Minimal sedation used for procedure.   No paresthesia endorsed by patient during the procedure.  Negative aspiration and negative test dose prior to incremental administration of local anesthetic. The patient tolerated the procedure well with no immediate complications.

## 2017-06-22 NOTE — Progress Notes (Signed)
   06/22/17 1700  Clinical Encounter Type  Visited With Patient and family together  Visit Type Initial;Other (Comment) (Advanced Directives Information)  Referral From Nurse  Consult/Referral To Chaplain   Chaplain responded to request for advanced directives.  Chaplain provided information regarding healthcare power of attorney.  Pt stated that she needed some time to look up information and would have nurse page chaplain when she is ready.  Rev. Tanise Russman Zenaida NieceVan [employer] Chaplain

## 2017-06-22 NOTE — Anesthesia Post-op Follow-up Note (Signed)
Anesthesia QCDR form completed.        

## 2017-06-22 NOTE — H&P (Signed)
The patient has been re-examined, and the chart reviewed, and there have been no interval changes to the documented history and physical.  Plan a right total knee replacement today.  Anesthesia is consulted regarding a peripheral nerve block for post-operative pain.  The risks, benefits, and alternatives have been discussed at length, and the patient is willing to proceed.     

## 2017-06-22 NOTE — Progress Notes (Signed)
Pt having great difficulty voiding after surgery this morning. Bladder scan showing greater than 500. Spoke with Dr. Odis LusterBowers may insert Indwelling foley catheter.

## 2017-06-22 NOTE — Op Note (Signed)
DATE OF SURGERY:  06/22/2017 TIME: 12:53 PM  PATIENT NAME:  Christine Reyes   AGE: 62 y.o.    PRE-OPERATIVE DIAGNOSIS:   Primary Osteoarthritis of right knee  POST-OPERATIVE DIAGNOSIS:  Same  PROCEDURE:  Procedure(s): TOTAL KNEE ARTHROPLASTY, RIGHT  SURGEON:  Lyndle HerrlichJames R Geovanna Simko, MD   ASSISTANT:  Altamese CabalMaurice Jones, PA-C  OPERATIVE IMPLANTS: Depuy Sigma, Cruciate Retaining Femoral component size  2.5, Sigma Fixed Bearing Tray size 2.5, Patella polyethylene 3-peg oval button size 32 mm, with a 12.5 mm polyethylene Curve-Plus insert.   PREOPERATIVE INDICATIONS:  Christine RumpfDenise R Leming is an 62 y.o. female who has a diagnosis of severe right knee osteoarthritis and elected for a right total knee arthroplasty after failing nonoperative treatment, including activity modification, pain medication, physical therapy and injections who has significant impairment of their activities of daily living.  Radiographs have demonstrated tricompartmental osteoarthritis joint space narrowing, osteophytes, subchondral sclerosis and cyst formation.  The risks, benefits, and alternatives were discussed at length including but not limited to the risks of infection, bleeding, nerve or blood vessel injury, knee stiffness, fracture, dislocation, loosening or failure of the hardware and the need for further surgery. Medical risks include but not limited to DVT and pulmonary embolism, myocardial infarction, stroke, pneumonia, respiratory failure and death. I discussed these risks with the patient in my office prior to the date of surgery. They understood these risks and were willing to proceed.  OPERATIVE FINDINGS AND UNIQUE ASPECTS OF THE CASE:  All three compartments with advanced and severe degenerative changes, large osteophytes and an abundance of synovial fluid. Significant deformity was also noted. A decision was made to proceed with total knee arthroplasty.   OPERATIVE DESCRIPTION:  The patient was brought to the  operative room and placed in a supine position after undergoing placement of a general anesthetic. IV antibiotics were given. Patient received tranexamic acid. The lower extremity was prepped and draped in the usual sterile fashion.  A time out was performed to verify the patient's name, date of birth, medical record number, correct site of surgery and correct procedure to be performed. The timeout was also used to confirm the patient received antibiotics and that appropriate instruments, implants and radiographs studies were available in the room.  The leg was elevated and exsanguinated with an Esmarch and the tourniquet was inflated to 250 mmHg.  A midline incision was made over the left knee.. A medial parapatellar arthrotomy was then made and the patella subluxed laterally and the knee was brought into 90 of flexion. Hoffa's fat pad along with the anterior cruciate ligament was resected and the medial joint line was exposed.  Attention was then turned to preparation of the patella. The thickness of the patella was measured with a caliper, the diameter measured with the patella templates.  The patella resection was then made with an oscillating saw using the patella cutting guide.  The 32 mm button fit appropriately.  3 peg holes for the patella component were then drilled.  The extramedullary tibial cutting guide was then placed using the anterior tibial crest and second ray of the foot as a reference.  The tibial cutting guide was adjusted to allow for appropriate posterior slope.  The tibial cutting block was pinned into position. The slotted stylus was used to measure the proximal tibial resection of 2 mm off the low medial side. Care was taken during the tibial resection to protect the medial and collateral ligaments.  The resected tibial bone was removed.  The  distal femur was resected using the TruMatch cutting guide.  Care was taken to protect the collateral ligaments during distal femoral  resection.  The distal femoral resection was performed with an oscillating saw. The femoral cutting guide was then removed. Extension gap was measured with a 12.5 mm spacer block and alignment and extension was confirmed using a long alignment rod. The femur was sized to be a 4. Rotation of the referencing guide was checked with the epicondylar axis and Whitesides line. Then the 4-in-1 cutting jig was then applied to the distal femur. A stylus was used to confirm that the anterior femur would not be notched.   Then the anterior, posterior and chamfer femoral cuts were then made with an oscillating saw.  All posterior osteophytes were removed.  The flexion gap was then measured with a flexion spacer block and long alignment rod and was found to be symmetric with the extension gap and perpendicular to mechanical axis of the tibia.  The proximal tibia plateau was then sized with trial trays. The best coverage was achieved with a size 2.5. This tibial tray was then pinned into position. The proximal tibia was then prepared with the reamer and keel punch.  After tibial preparation was completed, all trial components were inserted with polyethylene trials.  The knee was found to have excellent balance and full motion with a size 12.5 mm tibial polyethylene insert..    The trials were then placed. Knee was taken through a full range of motion and deemed to be stable with the trial components. All trial components were then removed.  The joint was copiously irrigated with pulse lavage.  The final total knee arthroplasty components were then cemented into place. The knee was held in extension while cement was allowed to cure.The knee was taken through a range of motion and the patella tracked well and the knee was again irrigated copiously.  The knee capsule was then injected with Exparel.  The medial arthrotomy was closed with #1 Vicryl and #2 Quill. The subcutaneous tissue closed with  2-0 vicryl, and skin  approximated with staples.  A dry sterile and compressive dressing was applied.  A Polar Care was applied to the operative knee.  The patient was awakened and brought to the PACU in stable and satisfactory condition.  All sharp, lap and instrument counts were correct at the conclusion the case. I spoke with the patient's family in the postop consultation room to let them know the case had been performed without complication and the patient was stable in recovery room.   Total tourniquet time was 55 minutes.

## 2017-06-22 NOTE — Anesthesia Procedure Notes (Addendum)
Spinal  Patient location during procedure: OR Start time: 06/22/2017 10:45 AM End time: 06/22/2017 10:52 AM Staffing Resident/CRNA: Nelda Marseille, CRNA Performed: resident/CRNA  Preanesthetic Checklist Completed: patient identified, site marked, surgical consent, pre-op evaluation, timeout performed, IV checked, risks and benefits discussed and monitors and equipment checked Spinal Block Patient position: sitting Prep: Betadine Patient monitoring: heart rate, continuous pulse ox, blood pressure and cardiac monitor Approach: midline Location: L2-3 Injection technique: single-shot Needle Needle type: Whitacre and Introducer  Needle gauge: 25 G Needle length: 9 cm Assessment Sensory level: T10 Additional Notes Negative paresthesia. Negative blood return. Positive free-flowing CSF. Expiration date of kit checked and confirmed. Patient tolerated procedure well, without complications.

## 2017-06-22 NOTE — Transfer of Care (Signed)
Immediate Anesthesia Transfer of Care Note  Patient: Rosezella RumpfDenise R Derhammer  Procedure(s) Performed: TOTAL KNEE ARTHROPLASTY (Right Knee)  Patient Location: PACU  Anesthesia Type:Spinal  Level of Consciousness: awake, alert  and oriented  Airway & Oxygen Therapy: Patient Spontanous Breathing and Patient connected to face mask oxygen  Post-op Assessment: Report given to RN and Post -op Vital signs reviewed and stable  Post vital signs: Reviewed and stable  Last Vitals:  Vitals:   06/22/17 1015 06/22/17 1023  BP:  113/65  Pulse:  65  Resp: 18 10  Temp:    SpO2:  100%    Last Pain:  Vitals:   06/22/17 1015  TempSrc:   PainSc: 0-No pain         Complications: No apparent anesthesia complications

## 2017-06-22 NOTE — Anesthesia Preprocedure Evaluation (Addendum)
Anesthesia Evaluation  Patient identified by MRN, date of birth, ID band Patient awake    Reviewed: Allergy & Precautions, H&P , NPO status , Patient's Chart, lab work & pertinent test results  History of Anesthesia Complications Negative for: history of anesthetic complications  Airway Mallampati: III  TM Distance: >3 FB Neck ROM: limited    Dental  (+) Chipped, Poor Dentition, Missing   Pulmonary sleep apnea , COPD, former smoker,           Cardiovascular Exercise Tolerance: Good hypertension, (-) angina(-) Past MI and (-) DOE      Neuro/Psych  Headaches, Anxiety negative psych ROS   GI/Hepatic negative GI ROS, Neg liver ROS, neg GERD  ,  Endo/Other  negative endocrine ROS  Renal/GU      Musculoskeletal  (+) Arthritis ,   Abdominal   Peds  Hematology negative hematology ROS (+)   Anesthesia Other Findings Chronic back pain, has back pain now  Past Medical History: No date: Anxiety No date: Arthritis No date: Headache No date: Hypertension No date: Obesity No date: Sleep apnea  Past Surgical History: No date: ABDOMINAL HYSTERECTOMY 1989: BREAST EXCISIONAL BIOPSY; Right No date: COLONOSCOPY WITH ESOPHAGOGASTRODUODENOSCOPY (EGD) No date: csection x2 No date: DILATION AND CURETTAGE OF UTERUS 12/14/2015: ESOPHAGOGASTRODUODENOSCOPY (EGD) WITH PROPOFOL; N/A     Comment:  Procedure: ESOPHAGOGASTRODUODENOSCOPY (EGD) WITH               PROPOFOL;  Surgeon: Scot Junobert T Elliott, MD;  [facility]              ENDOSCOPY;  Service: Endoscopy;  Laterality: N/A; No date: GASTRIC BYPASS No date: left knee surgery  BMI    Body Mass Index:  40.91 kg/m      Reproductive/Obstetrics negative OB ROS                            Anesthesia Physical Anesthesia Plan  ASA: III  Anesthesia Plan: Spinal   Post-op Pain Management: GA combined w/ Regional for post-op pain   Induction:   PONV Risk  Score and Plan: Propofol infusion and TIVA  Airway Management Planned: Natural Airway and Nasal Cannula  Additional Equipment:   Intra-op Plan:   Post-operative Plan:   Informed Consent: I have reviewed the patients History and Physical, chart, labs and discussed the procedure including the risks, benefits and alternatives for the proposed anesthesia with the patient or authorized representative who has indicated his/her understanding and acceptance.   Dental Advisory Given  Plan Discussed with: Anesthesiologist, CRNA and Surgeon  Anesthesia Plan Comments: (Patient reports no bleeding problems and no anticoagulant use. Patient has chronic back pain, has back pain right now but she would still like us to attempt the spinal.  Plan for spinal with backup GA  Patient consented for risks of anesthesia including but not limited to:  - adverse reactions to medications - risk of bleeding, infection, nerve damage and headache - risk of failed spinal - damage to teeth, lips or other oral mucosa - sore throat or hoarseness - Damage to heart, brain, lungs or loss of life  Patient voiced understanding.)       Anesthesia Quick Evaluation

## 2017-06-23 LAB — CBC
HCT: 31.1 % — ABNORMAL LOW (ref 35.0–47.0)
Hemoglobin: 10.3 g/dL — ABNORMAL LOW (ref 12.0–16.0)
MCH: 31.7 pg (ref 26.0–34.0)
MCHC: 33.1 g/dL (ref 32.0–36.0)
MCV: 95.7 fL (ref 80.0–100.0)
Platelets: 227 10*3/uL (ref 150–440)
RBC: 3.24 MIL/uL — AB (ref 3.80–5.20)
RDW: 13.4 % (ref 11.5–14.5)
WBC: 6.7 10*3/uL (ref 3.6–11.0)

## 2017-06-23 LAB — BASIC METABOLIC PANEL
ANION GAP: 8 (ref 5–15)
BUN: 37 mg/dL — ABNORMAL HIGH (ref 6–20)
CHLORIDE: 103 mmol/L (ref 101–111)
CO2: 26 mmol/L (ref 22–32)
CREATININE: 1.26 mg/dL — AB (ref 0.44–1.00)
Calcium: 8.5 mg/dL — ABNORMAL LOW (ref 8.9–10.3)
GFR calc non Af Amer: 45 mL/min — ABNORMAL LOW (ref >60)
GFR, EST AFRICAN AMERICAN: 52 mL/min — AB (ref >60)
Glucose, Bld: 103 mg/dL — ABNORMAL HIGH (ref 65–99)
POTASSIUM: 4.2 mmol/L (ref 3.5–5.1)
SODIUM: 137 mmol/L (ref 135–145)

## 2017-06-23 NOTE — Anesthesia Postprocedure Evaluation (Signed)
Anesthesia Post Note  Patient: Christine RumpfDenise R Reyes  Procedure(s) Performed: TOTAL KNEE ARTHROPLASTY (Right Knee)  Patient location during evaluation: Nursing Unit Anesthesia Type: Spinal Level of consciousness: awake, awake and alert and oriented Pain management: pain level controlled Vital Signs Assessment: post-procedure vital signs reviewed and stable Respiratory status: spontaneous breathing, nonlabored ventilation and respiratory function stable Cardiovascular status: blood pressure returned to baseline and stable Postop Assessment: no headache and no backache Anesthetic complications: no     Last Vitals:  Vitals:   06/23/17 0013 06/23/17 0351  BP: (!) 104/59 112/61  Pulse: 70 65  Resp:  14  Temp: 36.8 C 36.8 C  SpO2: 98% 99%    Last Pain:  Vitals:   06/23/17 0351  TempSrc: Oral  PainSc:                  Ginger CarneStephanie Uriyah Massimo

## 2017-06-23 NOTE — NC FL2 (Signed)
[place] MEDICAID FL2 LEVEL OF CARE SCREENING TOOL     IDENTIFICATION  Patient Name: Christine Reyes Birthdate: Jan 17, 1956 Sex: female Admission Date (Current Location): 06/22/2017  [place]   Facility and Address:  [facility], [address], [place], [place] [address]      Provider Number: [id number]  Attending Physician Name and Address:  Lyndle HerrlichBowers, James R, MD  Relative Name and Phone Number:       Current Level of Care: Hospital Recommended Level of Care: Skilled Nursing Facility Prior Approval Number:    Date Approved/Denied:   PASRR Number: ([id number] )  Discharge Plan: SNF    Current Diagnoses: Patient Active Problem List   Diagnosis Date Noted  . S/P TKR (total knee replacement), right 06/22/2017    Orientation RESPIRATION BLADDER Height & Weight     Self, Time, Situation, Place  Normal Continent Weight: 176 lb (79.8 kg) Height:  4\' 7"  (139.7 cm)  BEHAVIORAL SYMPTOMS/MOOD NEUROLOGICAL BOWEL NUTRITION STATUS      Continent Diet(Regular)  AMBULATORY STATUS COMMUNICATION OF NEEDS Skin   Extensive Assist Verbally Surgical wounds(Incision Right Knee)                       Personal Care Assistance Level of Assistance  Bathing, Feeding, Dressing Bathing Assistance: Limited assistance Feeding assistance: Independent Dressing Assistance: Limited assistance     Functional Limitations Info  Sight, Hearing, Speech Sight Info: Adequate Hearing Info: Adequate Speech Info: Adequate    SPECIAL CARE FACTORS FREQUENCY  PT (By licensed PT), OT (By licensed OT)     PT Frequency: (5) OT Frequency: (5)            Contractures      Additional Factors Info  Code Status, Allergies Code Status Info: (Full Code) Allergies Info: (MORPHINE AND RELATED, TRAMADOL)           Current Medications (06/23/2017):  This is the current hospital active medication list Current Facility-Administered  Medications  Medication Dose Route Frequency Provider Last Rate Last Dose  . acetaminophen (TYLENOL) tablet 650 mg  650 mg Oral Q4H PRN Lyndle HerrlichBowers, James R, MD       Or  . acetaminophen (TYLENOL) suppository 650 mg  650 mg Rectal Q4H PRN Lyndle HerrlichBowers, James R, MD      . acetaminophen (TYLENOL) tablet 1,000 mg  1,000 mg Oral Q6H Lyndle HerrlichBowers, James R, MD   1,000 mg at 06/23/17 [id number]  . aspirin EC tablet 325 mg  325 mg Oral Q breakfast Lyndle HerrlichBowers, James R, MD      . bisacodyl (DULCOLAX) suppository 10 mg  10 mg Rectal Daily PRN Lyndle HerrlichBowers, James R, MD      . docusate sodium (COLACE) capsule 100 mg  100 mg Oral BID Lyndle HerrlichBowers, James R, MD   100 mg at 06/22/17 2134  . gabapentin (NEURONTIN) capsule 300 mg  300 mg Oral TID Lyndle HerrlichBowers, James R, MD   300 mg at 06/22/17 2134  . irbesartan (AVAPRO) tablet 300 mg  300 mg Oral Daily Cindi CarbonSwayne, Mary M, RPH       And  . hydrochlorothiazide (HYDRODIURIL) tablet 25 mg  25 mg Oral Daily Cindi CarbonSwayne, Mary M, [facility]PH      . HYDROmorphone (DILAUDID) injection 0.5 mg  0.5 mg Intravenous Q2H PRN Lyndle HerrlichBowers, James R, MD      . lactated ringers infusion   Intravenous Continuous Lyndle HerrlichBowers, James R, MD      . magnesium citrate  solution 1 Bottle  1 Bottle Oral Once PRN Lyndle Herrlich, MD      . magnesium gluconate (MAGONATE) tablet 500 mg  500 mg Oral Daily Lyndle Herrlich, MD   500 mg at 06/22/17 1747  . magnesium hydroxide (MILK OF MAGNESIA) suspension 30 mL  30 mL Oral Daily PRN Lyndle Herrlich, MD      . menthol-cetylpyridinium (CEPACOL) lozenge 3 mg  1 lozenge Oral PRN Lyndle Herrlich, MD       Or  . phenol (CHLORASEPTIC) mouth spray 1 spray  1 spray Mouth/Throat PRN Lyndle Herrlich, MD      . metoCLOPramide (REGLAN) tablet 5-10 mg  5-10 mg Oral Q8H PRN Lyndle Herrlich, MD       Or  . metoCLOPramide (REGLAN) injection 5-10 mg  5-10 mg Intravenous Q8H PRN Lyndle Herrlich, MD      . ondansetron [facility]) tablet 4 mg  4 mg Oral Q6H PRN Lyndle Herrlich, MD       Or  . ondansetron [facility]) injection 4 mg  4 mg  Intravenous Q6H PRN Lyndle Herrlich, MD   4 mg at 06/23/17 0411  . oxyCODONE (Oxy IR/ROXICODONE) immediate release tablet 10 mg  10 mg Oral Q3H PRN Lyndle Herrlich, MD   10 mg at 06/23/17 2841  . oxyCODONE (Oxy IR/ROXICODONE) immediate release tablet 5 mg  5 mg Oral Q3H PRN Lyndle Herrlich, MD   5 mg at 06/22/17 2159  . pantoprazole (PROTONIX) EC tablet 40 mg  40 mg Oral BID PRN Lyndle Herrlich, MD   40 mg at 06/23/17 0411  . senna (SENOKOT) tablet 8.6 mg  1 tablet Oral BID Lyndle Herrlich, MD   8.6 mg at 06/22/17 2134     Discharge Medications: Please see discharge summary for a list of discharge medications.  Relevant Imaging Results:  Relevant Lab Results:   Additional Information (SSN: [id number])  Payton Spark, Student-Social Work

## 2017-06-23 NOTE — Progress Notes (Signed)
Clinical Social Worker (CSW) received SNF consult. PT is recommending home health. RN case manager aware of above. Please reconsult if future social work needs arise. CSW signing off.   Kameah Rawl, LCSW [phone] 

## 2017-06-23 NOTE — Evaluation (Addendum)
Physical Therapy Evaluation Patient Details Name: Christine Reyes MRN: [id number] DOB: [birth year] Today's Date: 06/23/2017   History of Present Illness  Pt admitted for R TKR. Pt is POD 1 at time of evaluation  Clinical Impression  Pt is a pleasant 62 year old female who was admitted for R TKR. Pt performs bed mobility, transfers, and ambulation with cga with RW. Pt demonstrates deficits with strength/ROM/mobility/pain. Pt demonstrates ability to perform 10 SLRs with independence, therefore does not require KI for mobility. Pt limited secondary to pain and became dizzy with ambulation. Once seated, symptoms resolved, gave ice water. Notified RN. Would benefit from skilled PT to address above deficits and promote optimal return to PLOF. Recommend transition to HHPT upon discharge from acute hospitalization.       Follow Up Recommendations Home health PT;Supervision for mobility/OOB    Equipment Recommendations  (youth RW)    Recommendations for Other Services       Precautions / Restrictions Precautions Precautions: Fall;Knee Precaution Booklet Issued: No Restrictions Weight Bearing Restrictions: Yes RLE Weight Bearing: Weight bearing as tolerated      Mobility  Bed Mobility Overal bed mobility: Needs Assistance Bed Mobility: Supine to Sit     Supine to sit: Min guard     General bed mobility comments: safe technique, able to follow commands for sequencing and use of railing to exit bed. Needed assist from therapist for R LE management  Transfers Overall transfer level: Needs assistance Equipment used: Rolling walker (2 wheeled) Transfers: Sit to/from Stand Sit to Stand: Min guard         General transfer comment: cues to push from seated surface. Upright posture noted. LImited WBing through R LE  Ambulation/Gait Ambulation/Gait assistance: Min guard Ambulation Distance (Feet): 30 Feet Assistive device: Rolling walker (2 wheeled) Gait Pattern/deviations:  Step-to pattern     General Gait Details: ambulated with RW with safe technique. RW a little too tall, adjusted to lowest height. Pt fatigues quickly with exertion, needed 1 standing rest break. Increased pain with mobility, guarding noted on R LE  Stairs            Wheelchair Mobility    Modified Rankin (Stroke Patients Only)       Balance Overall balance assessment: Needs assistance Sitting-balance support: Feet supported Sitting balance-Leahy Scale: Good     Standing balance support: Bilateral upper extremity supported Standing balance-Leahy Scale: Good                               Pertinent Vitals/Pain Pain Assessment: Faces Faces Pain Scale: Hurts whole lot Pain Location: R knee  Pain Descriptors / Indicators: Operative site guarding;Discomfort;Dull Pain Intervention(s): Limited activity within patient's tolerance;Patient requesting pain meds-RN notified;Ice applied;Repositioned    Home Living Family/patient expects to be discharged to:: Private residence Living Arrangements: Alone Available Help at Discharge: Family;Available 24 hours/day(mom is available to help) Type of Home: House Home Access: Stairs to enter;Ramped entrance Entrance Stairs-Rails: Can reach both Entrance Stairs-Number of Steps: 3 Home Layout: One level Home Equipment: Walker - 2 wheels;Shower seat      Prior Function Level of Independence: Independent         Comments: had occasionally been using the RW prior to admission     Hand Dominance        Extremity/Trunk Assessment   Upper Extremity Assessment Upper Extremity Assessment: Overall WFL for tasks assessed    Lower Extremity Assessment Lower  Extremity Assessment: Generalized weakness(R LE grossly 3-/5; L LE grossly 5/5)       Communication   Communication: No difficulties  Cognition Arousal/Alertness: Awake/alert Behavior During Therapy: WFL for tasks assessed/performed Overall Cognitive Status:  Within Functional Limits for tasks assessed                                        General Comments      Exercises Total Joint Exercises Goniometric ROM: R LE knee extension -7 degrees; flexion to be measured in PM Other Exercises Other Exercises: Supine ther-ex performed on R LE including ankle pumps, quad sets, SLRs, and hip abd/add. 10 reps performed with min assist.   Assessment/Plan    PT Assessment Patient needs continued PT services  PT Problem List Decreased strength;Decreased range of motion;Decreased activity tolerance;Decreased balance;Decreased mobility;Decreased knowledge of use of DME;Pain       PT Treatment Interventions DME instruction;Gait training;Stair training;Therapeutic exercise;Balance training    PT Goals (Current goals can be found in the Care Plan section)  Acute Rehab PT Goals Patient Stated Goal: to get stronger PT Goal Formulation: With patient Time For Goal Achievement: 07/07/17 Potential to Achieve Goals: Good    Frequency BID   Barriers to discharge        Co-evaluation               AM-PAC PT "6 Clicks" Daily Activity  Outcome Measure Difficulty turning over in bed (including adjusting bedclothes, sheets and blankets)?: Unable Difficulty moving from lying on back to sitting on the side of the bed? : Unable Difficulty sitting down on and standing up from a chair with arms (e.g., wheelchair, bedside commode, etc,.)?: Unable Help needed moving to and from a bed to chair (including a wheelchair)?: A Little Help needed walking in hospital room?: A Lot Help needed climbing 3-5 steps with a railing? : A Lot 6 Click Score: 10    End of Session Equipment Utilized During Treatment: Gait belt Activity Tolerance: Patient limited by pain Patient left: in chair;with chair alarm set;with SCD's reapplied Nurse Communication: Mobility status;Weight bearing status PT Visit Diagnosis: Muscle weakness (generalized)  (M62.81);History of falling (Z91.81);Difficulty in walking, not elsewhere classified (R26.2);Pain Pain - Right/Left: Right Pain - part of body: Knee    Time: 0915-0955 PT Time Calculation (min) (ACUTE ONLY): 40 min   Charges:   PT Evaluation $PT Eval Low Complexity: 1 Low PT Treatments $Therapeutic Exercise: 23-37 mins   PT G Codes:        Elizabeth Palau, PT, DPT [phone]   Dantonio Justen 06/23/2017, 11:42 AM

## 2017-06-23 NOTE — Progress Notes (Signed)
  Subjective:  Patient reports pain as moderate.  Foley placed overnight.  Objective:   VITALS:   Vitals:   06/22/17 1728 06/22/17 2012 06/23/17 0013 06/23/17 0351  BP:  (!) 92/57 (!) 104/59 112/61  Pulse:  75 70 65  Resp:  16  14  Temp:  97.8 F (36.6 C) 98.3 F (36.8 C) 98.2 F (36.8 C)  TempSrc:  Oral Oral Oral  SpO2: 99% 100% 98% 99%  Weight:      Height:        PHYSICAL EXAM:  Neurovascular intact Dorsiflexion/Plantar flexion intact Incision: dressing C/D/I Compartment soft  LABS  Results for orders placed or performed during the hospital encounter of 06/22/17 (from the past 24 hour(s))  ABO/Rh     Status: None   Collection Time: 06/22/17  9:12 AM  Result Value Ref Range   ABO/RH(D)      B NEG Performed at [facility], [address] [place], [place] [address]   CBC     Status: Abnormal   Collection Time: 06/23/17  6:55 AM  Result Value Ref Range   WBC 6.7 3.6 - 11.0 K/uL   RBC 3.24 (L) 3.80 - 5.20 MIL/uL   Hemoglobin 10.3 (L) 12.0 - 16.0 g/dL   HCT 60.431.1 (L) 54.035.0 - 98.147.0 %   MCV 95.7 80.0 - 100.0 fL   MCH 31.7 26.0 - 34.0 pg   MCHC 33.1 32.0 - 36.0 g/dL   RDW 19.113.4 47.811.5 - 29.514.5 %   Platelets 227 150 - 440 K/uL    Dg Knee Right Port  Result Date: 06/22/2017 CLINICAL DATA:  Status post total knee replacement. EXAM: PORTABLE RIGHT KNEE - 1-2 VIEW COMPARISON:  None. FINDINGS: Patient has a total right knee replacement. The knee is located without a periprosthetic fracture. Expected lucency within the soft tissues. Skin staples along the anterior knee. IMPRESSION: Expected postoperative changes from a right knee replacement. No complicating features. Electronically Signed   By: Richarda OverlieAdam  Henn M.D.   On: 06/22/2017 13:33    Assessment/Plan: 1 Day Post-Op   Active Problems:   S/P TKR (total knee replacement), right  Remove foley after PT today Advance diet Up with therapy D/C IV fluids Plan for discharge tomorrow   Lyndle HerrlichJames R Shailah Gibbins ,  MD 06/23/2017, 7:31 AM

## 2017-06-23 NOTE — Progress Notes (Signed)
Chaplain was asked to follow up with Pt for AD. Pt said they are not ready.   06/23/17 1000  Clinical Encounter Type  Visited With Patient  Visit Type Follow-up  Referral From Chaplain  Spiritual Encounters  Spiritual Needs Brochure

## 2017-06-23 NOTE — Progress Notes (Signed)
Physical Therapy Treatment Patient Details Name: Christine Reyes MRN: [id number] DOB: 12-Apr-1956 Today's Date: 06/23/2017    History of Present Illness Pt admitted for R TKR. Pt is POD 1 at time of evaluation.  She plans to have her 62 year old mother come help her but she is not able to do any physical assist.    PT Comments    Pt is making limited progress towards goals. Seated BP at 93/64; symptomatic with ambulation with increased fatigue. No syncope noted. Pt with slow gait speed, close chair follow. RN informed. Pt also with high pain reports despite timing of therapy vs pain meds. Needs heavy encouragement for participation. Will continue to progress. Good AAROM this date.  Follow Up Recommendations  Home health PT;Supervision for mobility/OOB     Equipment Recommendations  (youth RW)    Recommendations for Other Services       Precautions / Restrictions Precautions Precautions: Fall;Knee Precaution Booklet Issued: No Restrictions Weight Bearing Restrictions: Yes RLE Weight Bearing: Weight bearing as tolerated    Mobility  Bed Mobility Overal bed mobility: Needs Assistance Bed Mobility: Sit to Supine     Supine to sit: Min guard Sit to supine: Min guard   General bed mobility comments: needs min assist for repositioning. Limited by pain  Transfers Overall transfer level: Needs assistance Equipment used: Rolling walker (2 wheeled) Transfers: Sit to/from Stand Sit to Stand: Min assist         General transfer comment: needed increased assist this date and cues for sequencing. Limited carryover from previous sessions. once standing, upright posture noted. Would benefit from smaller RW  Ambulation/Gait Ambulation/Gait assistance: Min guard Ambulation Distance (Feet): 20 Feet Assistive device: Rolling walker (2 wheeled) Gait Pattern/deviations: Step-to pattern     General Gait Details: slow gait speed noted with slight R knee buckling. Cues for  correction. Pt fatigues quickly, close chair follow. Step to gait pattern   Stairs            Wheelchair Mobility    Modified Rankin (Stroke Patients Only)       Balance Overall balance assessment: Needs assistance Sitting-balance support: Feet supported Sitting balance-Leahy Scale: Good     Standing balance support: Bilateral upper extremity supported Standing balance-Leahy Scale: Good                              Cognition Arousal/Alertness: Awake/alert Behavior During Therapy: WFL for tasks assessed/performed Overall Cognitive Status: Within Functional Limits for tasks assessed                                        Exercises Total Joint Exercises Goniometric ROM: R knee flexion 77 degrees Other Exercises Other Exercises: Seated ther-ex performed on R LE including ankle pumps, quad sets, SLR, and SAQ. All ther-ex performed x 10 reps with min/mod assist. Pt pain limited    General Comments        Pertinent Vitals/Pain Pain Assessment: Faces Pain Score: 6  Faces Pain Scale: Hurts whole lot Pain Location: R knee  Pain Descriptors / Indicators: Operative site guarding;Discomfort;Dull Pain Intervention(s): Limited activity within patient's tolerance;Monitored during session;Premedicated before session;Repositioned    Home Living Family/patient expects to be discharged to:: Private residence Living Arrangements: Alone Available Help at Discharge: Family;Available 2 hours/day(84 y/o mother is coming to assist and cannot do any  physical assist) Type of Home: House Home Access: Stairs to enter;Ramped entrance Entrance Stairs-Rails: Can reach both Home Layout: One level Home Equipment: Walker - 2 wheels;Shower seat;Grab bars - toilet Additional Comments: pt has a transfer tub bench but states it takes up too much room in bathroom.  Rec a shower chair with back.    Prior Function Level of Independence: Independent      Comments:  had occasionally been using the RW prior to admission   PT Goals (current goals can now be found in the care plan section) Acute Rehab PT Goals Patient Stated Goal: to get stronger PT Goal Formulation: With patient Time For Goal Achievement: 07/07/17 Potential to Achieve Goals: Good Additional Goals Additional Goal #1: Pt will be able to perform bed mobility/transfers with supervision and safe technique to improve functional independence Progress towards PT goals: Progressing toward goals    Frequency    BID      PT Plan Current plan remains appropriate    Co-evaluation              AM-PAC PT "6 Clicks" Daily Activity  Outcome Measure  Difficulty turning over in bed (including adjusting bedclothes, sheets and blankets)?: Unable Difficulty moving from lying on back to sitting on the side of the bed? : Unable Difficulty sitting down on and standing up from a chair with arms (e.g., wheelchair, bedside commode, etc,.)?: Unable Help needed moving to and from a bed to chair (including a wheelchair)?: A Little Help needed walking in hospital room?: A Lot Help needed climbing 3-5 steps with a railing? : A Lot 6 Click Score: 10    End of Session Equipment Utilized During Treatment: Gait belt Activity Tolerance: Patient limited by pain Patient left: in bed;with bed alarm set;with SCD's reapplied Nurse Communication: Mobility status;Weight bearing status PT Visit Diagnosis: Muscle weakness (generalized) (M62.81);History of falling (Z91.81);Difficulty in walking, not elsewhere classified (R26.2);Pain Pain - Right/Left: Right Pain - part of body: Knee     Time: 1331-1410 PT Time Calculation (min) (ACUTE ONLY): 39 min  Charges:  $Gait Training: 23-37 mins $Therapeutic Exercise: 8-22 mins                    G Codes:       Elizabeth PalauStephanie Sharlotte Baka, PT, DPT [phone]    Christine Reyes 06/23/2017, 2:59 PM

## 2017-06-23 NOTE — Care Management Note (Signed)
Case Management Note  Patient Details  Name: Christine Reyes MRN: [id number] Date of Birth: May 05, 1956  Subjective/Objective:                  RNCM met with patient and her mother to discuss transition of care.  Patient would like to have home health PT come to her home so her elderly mother would not have to provide assistance with transportation.  She has a rolling walker available at home and will have a family member bring that in for PT to evaluate.  She uses [facility] for medications.  Action/Plan: Home health list provided. RNCM will follow.   Expected Discharge Date:                  Expected Discharge Plan:     In-House Referral:     Discharge planning Services  CM Consult  Post Acute Care Choice:  Home Health Choice offered to:  Patient  DME Arranged:    DME Agency:     HH Arranged:    [place] Agency:     Status of Service:  In process, will continue to follow  If discussed at Long Length of Stay Meetings, dates discussed:    Additional Comments:  Marshell Garfinkel, RN 06/23/2017, 9:44 AM

## 2017-06-23 NOTE — Evaluation (Signed)
Occupational Therapy Evaluation Patient Details Name: Christine Reyes MRN: [id number] DOB: November 06, 1955 Today's Date: 06/23/2017    History of Present Illness Pt admitted for Reyes TKR. Pt is POD 1 at time of evaluation.  She plans to have her 62 year old mother come help her but she is not able to do any physical assist.   Clinical Impression   Pt is 62 year old female s/p RTKR.  Pt was independent in all ADLs prior to surgery and is eager to return to PLOF.  Pt plans to have her mother come assist her but she is 62 years old and cannot do any physical assist if needed. Pt currently requires moderate assist for LB dressing while in seated position due to decreased level of alertness, dizziness, low BP, pain and limited AROM of Reyes knee.  Pt would benefit from instruction in dressing techniques with or without assistive devices for dressing and bathing skills.  Pt would also benefit from recommendations for home modifications to increase safety in the bathroom and prevent falls. [facility] based on how she progresses medically by tomorrow or by DC.      Follow Up Recommendations  Home health OT    Equipment Recommendations       Recommendations for Other Services       Precautions / Restrictions Precautions Precautions: Fall;Knee Precaution Booklet Issued: No Restrictions Weight Bearing Restrictions: Yes RLE Weight Bearing: Weight bearing as tolerated      Mobility Bed Mobility Overal bed mobility: Needs Assistance Bed Mobility: Sit to Supine     Supine to sit: Min guard Sit to supine: Min guard   General bed mobility comments: needs min assist for repositioning. Limited by pain  Transfers Overall transfer level: Needs assistance Equipment used: Rolling walker (2 wheeled) Transfers: Sit to/from Stand Sit to Stand: Min assist         General transfer comment: needed increased assist this date and cues for sequencing. Limited carryover from previous  sessions. once standing, upright posture noted. Would benefit from smaller RW    Balance Overall balance assessment: Needs assistance Sitting-balance support: Feet supported Sitting balance-Leahy Scale: Good     Standing balance support: Bilateral upper extremity supported Standing balance-Leahy Scale: Good                             ADL either performed or assessed with clinical judgement   ADL Overall ADL's : Needs assistance/impaired Eating/Feeding: Independent;Set up   Grooming: Wash/dry hands;Wash/dry face;Brushing hair;Set up;Independent           Upper Body Dressing : Independent;Set up   Lower Body Dressing: Moderate assistance;Set up;Supervision/safety;Sit to/from stand Lower Body Dressing Details (indicate cue type and reason): Mod assist mainly to stay awake for session for safety and problem solving; movements very slow               General ADL Comments: [facility] and may need SNF if her level of alertness and pain do not improve by tomorrow.  Her 8923 year old mother is coming to stay with her and cannot do any physical assist.      Vision Patient Visual Report: No change from baseline       Perception     Praxis      Pertinent Vitals/Pain Pain Assessment: Faces Pain Score: 6  Faces Pain Scale: Hurts whole lot Pain Location: Reyes  knee  Pain Descriptors / Indicators: Operative site guarding;Discomfort;Dull Pain Intervention(s): Limited activity within patient's tolerance;Monitored during session;Premedicated before session;Repositioned     Hand Dominance Right   Extremity/Trunk Assessment Upper Extremity Assessment Upper Extremity Assessment: Overall WFL for tasks assessed   Lower Extremity Assessment Lower Extremity Assessment: Defer to PT evaluation       Communication Communication Communication: No difficulties   Cognition Arousal/Alertness: Awake/alert Behavior During Therapy: WFL for tasks  assessed/performed(off and on sleepiness most likely due to pain meds and off and on nausea and burping but no emesis) Overall Cognitive Status: Within Functional Limits for tasks assessed                                     General Comments       Exercises Exercises: Other exercises;Total Joint Total Joint Exercises Goniometric ROM: Reyes knee flexion 77 degrees Other Exercises Other Exercises: Seated ther-ex performed on Reyes LE including ankle pumps, quad sets, SLR, and SAQ. All ther-ex performed x 10 reps with min/mod assist. Pt pain limited   Shoulder Instructions      Home Living Family/patient expects to be discharged to:: Private residence Living Arrangements: Alone Available Help at Discharge: Family;Available 41 hours/day(84 y/o mother is coming to assist and cannot do any physical assist) Type of Home: House Home Access: Stairs to enter;Ramped entrance Entrance Stairs-Number of Steps: 3 Entrance Stairs-Rails: Can reach both Home Layout: One level     Bathroom Shower/Tub: Chief Strategy Officer: Standard Bathroom Accessibility: No   Home Equipment: Environmental consultant - 2 wheels;Shower seat;Grab bars - toilet   Additional Comments: pt has a transfer tub bench but states it takes up too much room in bathroom.  Rec a shower chair with back.      Prior Functioning/Environment Level of Independence: Independent        Comments: had occasionally been using the RW prior to admission        OT Problem List: Pain;Decreased strength;Decreased range of motion;Decreased activity tolerance;Decreased knowledge of use of DME or AE      OT Treatment/Interventions: Self-care/ADL training;DME and/or AE instruction;Patient/family education    OT Goals(Current goals can be found in the care plan section) Acute Rehab OT Goals Patient Stated Goal: to do more for myself OT Goal Formulation: With patient Time For Goal Achievement: 07/07/17 Potential to Achieve Goals:  Good ADL Goals Pt Will Perform Lower Body Dressing: with min assist;with adaptive equipment;sit to/from stand Pt Will Transfer to Toilet: with supervision;stand pivot transfer;grab bars Pt Will Perform Toileting - Clothing Manipulation and hygiene: with supervision;sit to/from stand  OT Frequency: Min 1X/week   Barriers to D/C:    75 year old mother coming to assist her but she cannot do any physical assist       Co-evaluation              AM-PAC PT "6 Clicks" Daily Activity     Outcome Measure Help from another person eating meals?: None Help from another person taking care of personal grooming?: None Help from another person toileting, which includes using toliet, bedpan, or urinal?: A Little Help from another person bathing (including washing, rinsing, drying)?: A Little Help from another person to put on and taking off regular upper body clothing?: None Help from another person to put on and taking off regular lower body clothing?: A Little 6 Click Score: 21   End  of Session    Activity Tolerance: Patient limited by lethargy;Patient limited by pain(most likely from pain meds and low BP of 93/67 taken by PT before session and remained low after OT too) Patient left: in bed;with call bell/phone within reach;with bed alarm set                   Time: 1435-1510 OT Time Calculation (min): 35 min Charges:  OT General Charges $OT Visit: 1 Visit OT Evaluation $OT Eval Low Complexity: 1 Low OT Treatments $Self Care/Home Management : 8-22 mins G-Codes: OT G-codes **NOT FOR INPATIENT CLASS** Functional Assessment Tool Used: AM-PAC 6 Clicks Daily Activity;Clinical judgement   Susanne Borders, OTR/L ascom [phone] 06/23/17, 3:06 PM

## 2017-06-24 ENCOUNTER — Encounter: Payer: Self-pay | Admitting: Orthopedic Surgery

## 2017-06-24 LAB — CBC
HEMATOCRIT: 31.8 % — AB (ref 35.0–47.0)
HEMOGLOBIN: 10.7 g/dL — AB (ref 12.0–16.0)
MCH: 32.2 pg (ref 26.0–34.0)
MCHC: 33.7 g/dL (ref 32.0–36.0)
MCV: 95.5 fL (ref 80.0–100.0)
Platelets: 255 10*3/uL (ref 150–440)
RBC: 3.33 MIL/uL — ABNORMAL LOW (ref 3.80–5.20)
RDW: 13.4 % (ref 11.5–14.5)
WBC: 7.4 10*3/uL (ref 3.6–11.0)

## 2017-06-24 MED ORDER — SENNA 8.6 MG PO TABS: 1.0000 | ORAL_TABLET | Freq: Two times a day (BID) | ORAL | 0 refills | Status: DC

## 2017-06-24 MED ORDER — OXYCODONE HCL 5 MG PO TABS: 5.0000 mg | ORAL_TABLET | ORAL | 0 refills | Status: DC | PRN

## 2017-06-24 MED ORDER — METOCLOPRAMIDE HCL 5 MG PO TABS: 5.0000 mg | ORAL_TABLET | Freq: Three times a day (TID) | ORAL | 0 refills | Status: DC | PRN

## 2017-06-24 MED ORDER — ONDANSETRON HCL 4 MG PO TABS: 4.0000 mg | ORAL_TABLET | Freq: Four times a day (QID) | ORAL | 0 refills | Status: DC | PRN

## 2017-06-24 MED ORDER — ACETAMINOPHEN 325 MG PO TABS: 650.0000 mg | ORAL_TABLET | ORAL | 0 refills | Status: DC | PRN

## 2017-06-24 MED ORDER — IRBESARTAN 300 MG PO TABS: 300.0000 mg | ORAL_TABLET | Freq: Every day | ORAL | 0 refills | Status: DC

## 2017-06-24 MED ORDER — DOCUSATE SODIUM 100 MG PO CAPS: 100.0000 mg | ORAL_CAPSULE | Freq: Two times a day (BID) | ORAL | 0 refills | Status: DC

## 2017-06-24 MED ORDER — ASPIRIN 325 MG PO TBEC: 325.0000 mg | DELAYED_RELEASE_TABLET | Freq: Every day | ORAL | 0 refills | Status: DC

## 2017-06-24 MED ORDER — HYDROCHLOROTHIAZIDE 25 MG PO TABS: 25.0000 mg | ORAL_TABLET | Freq: Every day | ORAL | 0 refills | Status: DC

## 2017-06-24 NOTE — Progress Notes (Signed)
Physical Therapy Treatment Patient Details Name: Christine RumpfDenise R Buehler MRN: [id number] DOB: Mar 18, 1956 Today's Date: 06/24/2017    History of Present Illness Pt admitted for R TKR. Pt is POD 1 at time of evaluation.      PT Comments    Pt is not making progress towards goals demonstrating decreased ambulation distance this session. Needs close follow with recliner as she fatigues quickly. Has difficulty moving surgical leg this date, needs physical assist. BP taken in seated position 112/65 and 102/69 in standing. No reports of dizziness this date. Due to poor progress towards goals, pt not safe to dc home. Recommending SNF stay, pt agreeable. CSW notified.   Follow Up Recommendations  SNF(pt agreeable)     Equipment Recommendations       Recommendations for Other Services       Precautions / Restrictions Precautions Precautions: Fall;Knee Precaution Booklet Issued: Yes (comment) Restrictions Weight Bearing Restrictions: Yes RLE Weight Bearing: Weight bearing as tolerated    Mobility  Bed Mobility Overal bed mobility: Needs Assistance Bed Mobility: Supine to Sit     Supine to sit: Min assist     General bed mobility comments: needs slight increased assist, limited by pain this date  Transfers Overall transfer level: Needs assistance Equipment used: Rolling walker (2 wheeled) Transfers: Sit to/from Stand Sit to Stand: Min assist         General transfer comment: 2 attempts to stand with cues for safe technique  Ambulation/Gait Ambulation/Gait assistance: Min assist Ambulation Distance (Feet): 10 Feet Assistive device: Rolling walker (2 wheeled) Gait Pattern/deviations: Step-to pattern     General Gait Details: has difficulty taking steps this date, needing assist for moving surgical leg. Close chair follow as pt fatigues quickly and limited by pain. Suddenly stops ambulation and requests to sit. Very emotional this session. Further ambulation  deferred.   Stairs            Wheelchair Mobility    Modified Rankin (Stroke Patients Only)       Balance                                            Cognition Arousal/Alertness: Awake/alert Behavior During Therapy: WFL for tasks assessed/performed Overall Cognitive Status: Within Functional Limits for tasks assessed                                        Exercises Total Joint Exercises Goniometric ROM: R knee 0-70 degrees; limited by pain Other Exercises Other Exercises: Supine ther-ex performed on R LE including ankle pumps, quad sets, SLR, hip abd/add, and SAQ. All ther-ex performed x 12 reps with min/mod assist. Pt pain limited. Only able to tolerate 1 rep of seated heel slide.    General Comments        Pertinent Vitals/Pain Pain Assessment: 0-10 Pain Score: 10-Worst pain ever Pain Location: R knee  Pain Descriptors / Indicators: Operative site guarding;Discomfort;Dull Pain Intervention(s): Limited activity within patient's tolerance;Ice applied;Premedicated before session    Home Living                      Prior Function            PT Goals (current goals can now be found in the care plan section)  Acute Rehab PT Goals Patient Stated Goal: to do more for myself PT Goal Formulation: With patient Time For Goal Achievement: 07/07/17 Potential to Achieve Goals: Good Progress towards PT goals: Progressing toward goals    Frequency    BID      PT Plan Discharge plan needs to be updated    Co-evaluation              AM-PAC PT "6 Clicks" Daily Activity  Outcome Measure  Difficulty turning over in bed (including adjusting bedclothes, sheets and blankets)?: Unable Difficulty moving from lying on back to sitting on the side of the bed? : Unable Difficulty sitting down on and standing up from a chair with arms (e.g., wheelchair, bedside commode, etc,.)?: Unable Help needed moving to and from a bed  to chair (including a wheelchair)?: A Little Help needed walking in hospital room?: A Lot Help needed climbing 3-5 steps with a railing? : A Lot 6 Click Score: 10    End of Session Equipment Utilized During Treatment: Gait belt Activity Tolerance: Patient limited by pain Patient left: in chair;with chair alarm set;with SCD's reapplied Nurse Communication: Mobility status;Weight bearing status PT Visit Diagnosis: Muscle weakness (generalized) (M62.81);History of falling (Z91.81);Difficulty in walking, not elsewhere classified (R26.2);Pain Pain - Right/Left: Right Pain - part of body: Knee     Time: 6045-4098 PT Time Calculation (min) (ACUTE ONLY): 38 min  Charges:  $Gait Training: 23-37 mins $Therapeutic Exercise: 8-22 mins                    G Codes:       Elizabeth Palau, PT, DPT [phone]    Tyah Acord 06/24/2017, 10:25 AM

## 2017-06-24 NOTE — Clinical Social Work Placement (Signed)
   CLINICAL SOCIAL WORK PLACEMENT  NOTE  Date:  06/24/2017  Patient Details  Name: Christine Reyes MRN: [id number] Date of Birth: 07/14/1955  Clinical Social Work is seeking post-discharge placement for this patient at the Skilled  Nursing Facility level of care (*CSW will initial, date and re-position this form in  chart as items are completed):  Yes   Patient/family provided with [facility] Clinical Social Work Department's list of facilities offering this level of care within the geographic area requested by the patient (or if unable, by the patient's family).  Yes   Patient/family informed of their freedom to choose among providers that offer the needed level of care, that participate in Medicare, Medicaid or managed care program needed by the patient, have an available bed and are willing to accept the patient.  Yes   Patient/family informed of [facility]'s ownership interest in [facility] and [facility], as well as of the fact that they are under no obligation to receive care at these facilities.  PASRR submitted to EDS on       PASRR number received on       Existing PASRR number confirmed on 06/24/17     FL2 transmitted to all facilities in geographic area requested by pt/family on 06/24/17     FL2 transmitted to all facilities within larger geographic area on       Patient informed that his/her managed care company has contracts with or will negotiate with certain facilities, including the following:        Yes   Patient/family informed of bed offers received.  Patient [facility] bed at [facility]     Physician recommends and patient chooses bed at      Patient to be transferred to   on  .  Patient to be transferred to facility by       Patient family notified on   of transfer.  Name of family member notified:        PHYSICIAN       Additional Comment:    _______________________________________________ Titus Drone, Darleen CrockerBailey M, LCSW 06/24/2017,  11:56 AM

## 2017-06-24 NOTE — Progress Notes (Signed)
S:  62 yo f sp Right total knee arthroplasty POD2 Patient continues to have knee pain posterior and lateral knee referred to hip and calf. Continues to take regular pain medication. Tolerating solids and liquids well. No NVD. Has passed flatus, but no bowel movement yet. Up with PT assistance. No dizziness despite hypotension.  PT is reporting not making progress towards goals.   O:  T: 98.5,   HR: 80   RR 18   BP 100/64     spO2 97. General: Alert and oriented, NAD MSK: moderate knee swelling, dressing CDI, no bleeding or signs of infection. Neuro: grossly intact. 2+ pedal pulses, intact distal sensation right lower extremity and intact ankle plantar/dorsiflexion  A: 62 yo f sp Right total knee arthroplasty POD2   Plan:  Continue PT and OT as tolerated. Follow recommendations for discharge to rehab as appropriate. Needs authorization still. Continue advance diet and monitor for BM Up with therapy  Hurshel Keyslex Aryani Daffern PA-C 06/24/2017  11:23

## 2017-06-24 NOTE — Discharge Planning (Signed)
Patient needs to have BM for discharge. Has been given senna, colace, milk of mag, mag citrate and dulcolax suppository. Waiting on results.

## 2017-06-24 NOTE — Clinical Social Work Note (Signed)
Clinical Social Work Assessment  Patient Details  Name: Christine Reyes MRN: [id number] Date of Birth: [birth year]  Date of referral:  06/24/17               Reason for consult:  Facility Placement                Permission sought to share Reyes with:  Christine Reyes granted to share Reyes::  Yes, Verbal Permission Granted  Name::      Christine Reyes::   Christine Reyes   Relationship::     Contact Reyes:     Housing/Transportation Living arrangements for the past 2 months:  Christine Reyes:  Patient Patient Interpreter Needed:  None Criminal Activity/Legal Involvement Pertinent to Current Situation/Hospitalization:  No - Comment as needed Significant Relationships:  Adult Children, Siblings Lives with:  Self Do you feel safe going back to the place where you live?  Yes Need for family participation in patient care:  Yes (Comment)  Care giving concerns:  Patient lives alone in [place].    Social Worker assessment / plan:  Holiday representative (CSW) received verbal consult from PT that they are changing the recommendation from home health to SNF. CSW met with patient alone at bedside and she was sitting up in the chair. CSW introduced self and explained role of CSW department. Patient was alert and oriented X4. Patient reported that she lives alone and her sister Christine Reyes is her HPOA. CSW explained that PT is now recommending SNF and [employer] will have to approve SNF. Patient is agreeable to SNF search in [facility] and prefers a private room. FL2 complete and faxed out.   CSW presented bed offers. Patient chose [facility]. Per Christine Reyes admissions coordinator at [facility] she will start [facility] SNF authorization today.    Employment status:  Retired Nurse, adult PT Recommendations:  [place] / Referral to community resources:   [facility]  Patient/Family's Response to care:  Patient is agreeable to go to [facility].   Patient/Family's Understanding of and Emotional Response to Diagnosis, Current Treatment, and Prognosis:  Patient was very pleasant and thanked CSW for assistance.   Emotional Assessment Appearance:  Appears stated age Attitude/Demeanor/Rapport:    Affect (typically observed):  Accepting, Adaptable, Pleasant Orientation:  Oriented to Self, Oriented to Place, Oriented to  Time, Oriented to Situation Alcohol / Substance use:  Not Applicable Psych involvement (Current and /or in the community):  No (Comment)  Discharge Needs  Concerns to be addressed:  Discharge Planning Concerns Readmission within the last 30 days:  No Current discharge risk:  Dependent with Mobility Barriers to Discharge:  Continued Medical Work up   [employer], Veronia Beets, LCSW 06/24/2017, 11:58 AM

## 2017-06-24 NOTE — Discharge Planning (Signed)
IV removed.  RN assessment and VS revealed stability for DC to facility.  Discharge papers printed, signed and [facility]. Informed of suggested FU appts. Script in packet and rest sent via fax from Dr. Odis LusterBowers office.  BM completed before DC. Report called and s/w CJ McDade, LPN.  EMS contacted to transport and taking to room 408.

## 2017-06-24 NOTE — Progress Notes (Signed)
Physical Therapy Treatment Patient Details Name: Christine Reyes MRN: [id number] DOB: [birth year] Today's Date: 06/24/2017    History of Present Illness Pt admitted for R TKR. Pt is POD 1 at time of OT/PT evaluations.      PT Comments    Pt is making gradual progress with increased ambulation distance noted this session, however does require multiple rest breaks during gait training. Limited endurance noted with there-ex. Still very limited by pain, continue to recommend SNF.  Follow Up Recommendations  SNF     Equipment Recommendations       Recommendations for Other Services       Precautions / Restrictions Precautions Precautions: Fall;Knee Precaution Booklet Issued: Yes (comment) Restrictions Weight Bearing Restrictions: Yes RLE Weight Bearing: Weight bearing as tolerated    Mobility  Bed Mobility Overal bed mobility: Needs Assistance Bed Mobility: Supine to Sit     Supine to sit: Min assist     General bed mobility comments: needs assist to guide R LE off bed. Heavy cues for sequencing  Transfers Overall transfer level: Needs assistance Equipment used: Rolling walker (2 wheeled) Transfers: Sit to/from Stand Sit to Stand: Min assist         General transfer comment: transfers performed with upright posture and slight assist.   Ambulation/Gait Ambulation/Gait assistance: Min assist Ambulation Distance (Feet): 40 Feet Assistive device: Rolling walker (2 wheeled) Gait Pattern/deviations: Step-to pattern     General Gait Details: still complains of difficulty taking steps with R LE with min assist for advancing LE. Fatigues quickly needing 1 standing rest break and 1 seated rest break. Very slow gait speed noted   Stairs            Wheelchair Mobility    Modified Rankin (Stroke Patients Only)       Balance                                            Cognition Arousal/Alertness: Awake/alert Behavior During Therapy: WFL  for tasks assessed/performed Overall Cognitive Status: Within Functional Limits for tasks assessed                                        Exercises Other Exercises Other Exercises: supine ther-ex performed on R LE including ankle pumps, quad sets, SLR, and SAQ. All ther-ex performed x 12 with min/mod assist. Need encouragement to participate.    General Comments        Pertinent Vitals/Pain Pain Assessment: 0-10 Pain Score: 5  Pain Location: R knee  Pain Descriptors / Indicators: Aching;Grimacing;Guarding;Moaning Pain Intervention(s): Limited activity within patient's tolerance    Home Living                      Prior Function            PT Goals (current goals can now be found in the care plan section) Acute Rehab PT Goals Patient Stated Goal: to do more for myself PT Goal Formulation: With patient Time For Goal Achievement: 07/07/17 Potential to Achieve Goals: Good Progress towards PT goals: Progressing toward goals    Frequency    BID      PT Plan Current plan remains appropriate    Co-evaluation  AM-PAC PT "6 Clicks" Daily Activity  Outcome Measure  Difficulty turning over in bed (including adjusting bedclothes, sheets and blankets)?: Unable Difficulty moving from lying on back to sitting on the side of the bed? : Unable Difficulty sitting down on and standing up from a chair with arms (e.g., wheelchair, bedside commode, etc,.)?: Unable Help needed moving to and from a bed to chair (including a wheelchair)?: A Little Help needed walking in hospital room?: A Lot Help needed climbing 3-5 steps with a railing? : A Lot 6 Click Score: 10    End of Session Equipment Utilized During Treatment: Gait belt Activity Tolerance: Patient limited by pain Patient left: in bed;with bed alarm set;with SCD's reapplied Nurse Communication: Mobility status PT Visit Diagnosis: Muscle weakness (generalized) (M62.81);History of  falling (Z91.81);Difficulty in walking, not elsewhere classified (R26.2);Pain Pain - Right/Left: Right Pain - part of body: Knee     Time: [id number] PT Time Calculation (min) (ACUTE ONLY): 38 min  Charges:  $Gait Training: 23-37 mins $Therapeutic Exercise: 8-22 mins                    G Codes:       Christine Reyes, PT, DPT [phone]    Hermenia Fritcher 06/24/2017, 3:27 PM

## 2017-06-24 NOTE — Progress Notes (Signed)
Patient is medically stable for D/C to [facility] today. Per [facility] coordinator at [facility] authorization has been received and patient can come today to room 408. RN will call report to 400 hall nurse and arrange EMS for transport. Clinical Child psychotherapistocial Worker (CSW) sent D/C orders to [facility] via [facility]. Patient is aware of above. CSW contacted patient's granddaughter [name] and made her aware of above. [facility] has agreed to accept the prescriptions via fax from Dr. Odis LusterBowers office. Please reconsult if future social work needs arise. CSW signing off.   Baker Hughes IncorporatedBailey Inola Lisle, LCSW [phone]

## 2017-06-24 NOTE — Discharge Instructions (Signed)
Continue weight bear as tolerated on the right lower extremity.   ° °Elevate the right lower extremity whenever possible and continue the polar care while elevating the extremity. Patient may shower. No bath or submerging the wound.   ° °Take ECASA as directed for blood clot prevention. ° °Continue to work on knee range of motion exercises at home as instructed by physical therapy. Continue to use a walker for assistance with ambulation until cleared by physical therapy. ° °Call [phone] with any questions, such as fever > 101.5 degrees, drainage from the wound or shortness of breath. ° ° °

## 2017-06-24 NOTE — Discharge Summary (Signed)
Physician Discharge Summary  Patient ID: Christine RumpfDenise R Widdowson MRN: [id number] DOB/AGE: May 08, 1956 62 y.o.  Admit date: 06/22/2017 Discharge date: 06/24/2017  Admission Diagnoses:  M17.0 Bilateral Primary Osteoarthritis of knee <principal problem not specified>  Discharge Diagnoses:  M17.0 Bilateral Primary Osteoarthritis of knee Active Problems:   S/P TKR (total knee replacement), right   Past Medical History:  Diagnosis Date  . Anxiety   . Arthritis   . Headache   . Hypertension   . Obesity   . Sleep apnea     Surgeries: Procedure(s): TOTAL KNEE ARTHROPLASTY on 06/22/2017   Consultants (if any):   Discharged Condition: Improved  Hospital Course: Christine RumpfDenise R Shannon is an 62 y.o. female who was admitted 06/22/2017 with a diagnosis of  M17.0 Bilateral Primary Osteoarthritis of knee <principal problem not specified> and went to the operating room on 06/22/2017 and underwent the above named procedures.    She was given perioperative antibiotics:  Anti-infectives (From admission, onward)   Start     Dose/Rate Route Frequency Ordered Stop   06/22/17 1730  ceFAZolin (ANCEF) IVPB 2g/100 mL premix     2 g 200 mL/hr over 30 Minutes Intravenous Every 6 hours 06/22/17 1656 06/22/17 2343   06/22/17 1129  50,000 units bacitracin in 0.9% normal saline 250 mL irrigation  Status:  Discontinued       As needed 06/22/17 1129 06/22/17 1242   06/22/17 0854  ceFAZolin (ANCEF) 2-4 GM/100ML-% IVPB    Comments:  Rayann HemanKizziah, Kaitlin   : cabinet override      06/22/17 0854 06/22/17 1055   06/22/17 0600  ceFAZolin (ANCEF) IVPB 2g/100 mL premix     2 g 200 mL/hr over 30 Minutes Intravenous On call to O.R. 06/21/17 2208 06/22/17 1105    .  She was given sequential compression devices, early ambulation, and ECASA for DVT prophylaxis.  She benefited maximally from the hospital stay and there were no complications.    Recent vital signs:  Vitals:   06/24/17 0515 06/24/17 0741  BP: (!) 92/55 100/64   Pulse: 80 80  Resp: 20 18  Temp:  98.5 F (36.9 C)  SpO2: 96% 97%    Recent laboratory studies:  Lab Results  Component Value Date   HGB 10.7 (L) 06/24/2017   HGB 10.3 (L) 06/23/2017   HGB 12.0 06/10/2017   Lab Results  Component Value Date   WBC 7.4 06/24/2017   PLT 255 06/24/2017   No results found for: INR Lab Results  Component Value Date   NA 137 06/23/2017   K 4.2 06/23/2017   CL 103 06/23/2017   CO2 26 06/23/2017   BUN 37 (H) 06/23/2017   CREATININE 1.26 (H) 06/23/2017   GLUCOSE 103 (H) 06/23/2017    Discharge Medications:   Allergies as of 06/24/2017      Reactions   Morphine And Related Itching   Tramadol Itching      Medication List    STOP taking these medications   b complex vitamins tablet   BC FAST PAIN RELIEF ARTHRITIS PO   diclofenac sodium 1 % Gel Commonly known as:  VOLTAREN   meloxicam 15 MG tablet Commonly known as:  MOBIC     TAKE these medications   acetaminophen 325 MG tablet Commonly known as:  TYLENOL Take 2 tablets (650 mg total) by mouth every 4 (four) hours as needed for mild pain ((score 1 to 3) or temp > 100.5).   aspirin 325 MG EC tablet Take 1  tablet (325 mg total) by mouth daily with breakfast. Start taking on:  06/25/2017   cholecalciferol 1000 units tablet Commonly known as:  VITAMIN D Take 1,000 Units by mouth daily.   docusate sodium 100 MG capsule Commonly known as:  COLACE Take 1 capsule (100 mg total) by mouth 2 (two) times daily.   GAS-X EXTRA STRENGTH 125 MG Caps Generic drug:  Simethicone Take 1 capsule by mouth 3 (three) times daily as needed (gas pains).   hydrochlorothiazide 25 MG tablet Commonly known as:  HYDRODIURIL Take 1 tablet (25 mg total) by mouth daily for 30 doses. Start taking on:  06/25/2017   irbesartan 300 MG tablet Commonly known as:  AVAPRO Take 1 tablet (300 mg total) by mouth daily for 30 doses. Start taking on:  06/25/2017   magnesium gluconate 500 MG tablet Commonly  known as:  MAGONATE Take 500 mg by mouth daily.   metoCLOPramide 5 MG tablet Commonly known as:  REGLAN Take 1-2 tablets (5-10 mg total) by mouth every 8 (eight) hours as needed for up to 30 doses for nausea (if ondansetron (ZOFRAN) ineffective.).   multivitamin with minerals tablet Take 1 tablet by mouth daily.   NEURONTIN 300 MG capsule Generic drug:  gabapentin Take 300 mg by mouth 3 (three) times daily.   olmesartan-hydrochlorothiazide 40-25 MG tablet Commonly known as:  BENICAR HCT Take 1 tablet by mouth daily.   ondansetron 4 MG tablet Commonly known as:  ZOFRAN Take 1 tablet (4 mg total) by mouth every 6 (six) hours as needed for nausea.   oxyCODONE 5 MG immediate release tablet Commonly known as:  Oxy IR/ROXICODONE Take 1 tablet (5 mg total) by mouth every 3 (three) hours as needed for moderate pain ((score 4 to 6)).   pantoprazole 40 MG tablet Commonly known as:  PROTONIX Take 40 mg by mouth 2 (two) times daily as needed (heartburn).   senna 8.6 MG Tabs tablet Commonly known as:  SENOKOT Take 1 tablet (8.6 mg total) by mouth 2 (two) times daily.   ZINC SULFATE PO Take by mouth.            Durable Medical Equipment  (From admission, onward)        Start     Ordered   06/22/17 1656  DME Walker rolling  Once    Question:  Patient needs a walker to treat with the following condition  Answer:  S/P TKR (total knee replacement), right   06/22/17 1656      Diagnostic Studies: Dg Knee Right Port  Result Date: 06/22/2017 CLINICAL DATA:  Status post total knee replacement. EXAM: PORTABLE RIGHT KNEE - 1-2 VIEW COMPARISON:  None. FINDINGS: Patient has a total right knee replacement. The knee is located without a periprosthetic fracture. Expected lucency within the soft tissues. Skin staples along the anterior knee. IMPRESSION: Expected postoperative changes from a right knee replacement. No complicating features. Electronically Signed   By: Richarda Overlie M.D.   On:  06/22/2017 13:33    Disposition: 01-Home or Self Care    Contact information for after-discharge care    Destination    [facility] .   Service:  Skilled Nursing Contact information: [address] [place] [address] [phone]               Signed: Lyndle Herrlich ,MD 06/24/2017, 2:26 PM

## 2017-06-24 NOTE — Clinical Social Work Placement (Signed)
   CLINICAL SOCIAL WORK PLACEMENT  NOTE  Date:  06/24/2017  Patient Details  Name: Christine RumpfDenise R Nordstrom MRN: [id number] Date of Birth: Feb 17, 1956  Clinical Social Work is seeking post-discharge placement for this patient at the Skilled  Nursing Facility level of care (*CSW will initial, date and re-position this form in  chart as items are completed):  Yes   Patient/family provided with [place] Clinical Social Work Department's list of facilities offering this level of care within the geographic area requested by the patient (or if unable, by the patient's family).  Yes   Patient/family informed of their freedom to choose among providers that offer the needed level of care, that participate in Medicare, Medicaid or managed care program needed by the patient, have an available bed and are willing to accept the patient.  Yes   Patient/family informed of 's ownership interest in [facility] and [facility], as well as of the fact that they are under no obligation to receive care at these facilities.  PASRR submitted to EDS on       PASRR number received on       Existing PASRR number confirmed on 06/24/17     FL2 transmitted to all facilities in geographic area requested by pt/family on 06/24/17     FL2 transmitted to all facilities within larger geographic area on       Patient informed that his/her managed care company has contracts with or will negotiate with certain facilities, including the following:        Yes   Patient/family informed of bed offers received.  Patient [facility]     Physician recommends and patient chooses bed at      Patient to be transferred to [facility] on 06/24/17.  Patient to be transferred to facility by [facility]     Patient family notified on 06/24/17 of transfer.  Name of family member notified:  (Patient's granddaughter [name] is aware of D/C today. )     PHYSICIAN        Additional Comment:    _______________________________________________ Rohit Deloria, Darleen CrockerBailey M, LCSW 06/24/2017, 2:53 PM

## 2017-06-24 NOTE — Progress Notes (Signed)
Occupational Therapy Treatment Patient Details Name: Christine Reyes MRN: [id number] DOB: [birth year] Today's Date: 06/24/2017    History of present illness Pt admitted for R TKR. Pt is POD 1 at time of OT/PT evaluations.     OT comments  Pt seen for OT treatment this date, limited by significant R knee pain. Pt instructed in cognitive behavioral pain coping strategies to support improved pain management, including pleasant imagery and distraction techniques. Pt verbalized understanding and would benefit from additional training to maximize recall and carryover. OT noted positioning of folded pillowcase under polar care had become displaced. OT readjusted to maximize skin protection and instructed pt in checking throughout the day to ensure coverage and to notified nursing if it needed to be readjusted again. Pt not progressing towards goals as anticipated, limited by severe pain. Updating recommendation to STR. Will continue to progress.   Follow Up Recommendations  SNF    Equipment Recommendations  3 in 1 bedside commode    Recommendations for Other Services      Precautions / Restrictions Precautions Precautions: Fall;Knee Precaution Booklet Issued: Yes (comment) Restrictions Weight Bearing Restrictions: Yes RLE Weight Bearing: Weight bearing as tolerated       Mobility Bed Mobility          General bed mobility comments: deferred, up in recliner  Transfers         General transfer comment: unable to attempt due to pain    Balance Overall balance assessment: Needs assistance Sitting-balance support: Feet supported Sitting balance-Leahy Scale: Good Sitting balance - Comments: reaching within BOS                                   ADL either performed or assessed with clinical judgement   ADL                                               Vision       Perception     Praxis      Cognition Arousal/Alertness:  Awake/alert Behavior During Therapy: WFL for tasks assessed/performed Overall Cognitive Status: Within Functional Limits for tasks assessed                                          Exercises Other Exercises Other Exercises: Pt in significant R knee pain. Pt educated in cognitive behavioral strategies for pain management including distraction and pleasant imagery. Pt verbalized understanding.    Shoulder Instructions       General Comments folded pillowcase under polar care noted to have shifted slightly, OT readjusted to maximize coverage between skin and polar care to prevent possible injury to skin    Pertinent Vitals/ Pain       Pain Assessment: 0-10 Pain Score: 9  Pain Location: R knee  Pain Descriptors / Indicators: Aching;Grimacing;Guarding;Moaning Pain Intervention(s): Limited activity within patient's tolerance;Monitored during session;Ice applied  Home Living Family/patient expects to be discharged to:: Private residence Living Arrangements: Alone                                      Prior Functioning/Environment  Frequency  Min 1X/week        Progress Toward Goals  OT Goals(current goals can now be found in the care plan section)  Progress towards OT goals: Not progressing toward goals - comment(pain limiting progress)  Acute Rehab OT Goals Patient Stated Goal: to do more for myself OT Goal Formulation: With patient Time For Goal Achievement: 07/07/17 Potential to Achieve Goals: Good  Plan Discharge plan needs to be updated;Frequency remains appropriate    Co-evaluation                 AM-PAC PT "6 Clicks" Daily Activity     Outcome Measure   Help from another person eating meals?: None Help from another person taking care of personal grooming?: None Help from another person toileting, which includes using toliet, bedpan, or urinal?: A Little Help from another person bathing (including washing,  rinsing, drying)?: A Lot Help from another person to put on and taking off regular upper body clothing?: None Help from another person to put on and taking off regular lower body clothing?: A Lot 6 Click Score: 19    End of Session    OT Visit Diagnosis: Pain;Muscle weakness (generalized) (M62.81);Other abnormalities of gait and mobility (R26.89) Pain - Right/Left: Right Pain - part of body: Knee   Activity Tolerance Patient limited by pain   Patient Left in chair;with call bell/phone within reach;with chair alarm set;Other (comment)(with PA in room to assess)   Nurse Communication          Time: [phone] OT Time Calculation (min): 12 min  Charges: OT General Charges $OT Visit: 1 Visit OT Treatments $Therapeutic Activity: 8-22 mins   Richrd Prime, MPH, MS, OTR/L ascom [phone] 06/24/17, 11:35 AM

## 2017-07-15 DIAGNOSIS — Z96659 Presence of unspecified artificial knee joint: Secondary | ICD-10-CM | POA: Insufficient documentation

## 2017-07-22 ENCOUNTER — Emergency Department
Admission: EM | Admit: 2017-07-22 | Discharge: 2017-07-22 | Disposition: A | Discharge status: Home / Self Care | Payer: Medicare Other | Attending: Emergency Medicine | Admitting: Emergency Medicine

## 2017-07-22 ENCOUNTER — Emergency Department: Payer: Medicare Other

## 2017-07-22 ENCOUNTER — Encounter: Payer: Self-pay | Admitting: *Deleted

## 2017-07-22 DIAGNOSIS — W19XXXA Unspecified fall, initial encounter: Secondary | ICD-10-CM | POA: Insufficient documentation

## 2017-07-22 DIAGNOSIS — S8001XA Contusion of right knee, initial encounter: Secondary | ICD-10-CM | POA: Insufficient documentation

## 2017-07-22 DIAGNOSIS — I10 Essential (primary) hypertension: Secondary | ICD-10-CM | POA: Insufficient documentation

## 2017-07-22 DIAGNOSIS — Y929 Unspecified place or not applicable: Secondary | ICD-10-CM | POA: Diagnosis not present

## 2017-07-22 DIAGNOSIS — Y939 Activity, unspecified: Secondary | ICD-10-CM | POA: Diagnosis not present

## 2017-07-22 DIAGNOSIS — Y999 Unspecified external cause status: Secondary | ICD-10-CM | POA: Insufficient documentation

## 2017-07-22 DIAGNOSIS — S8991XA Unspecified injury of right lower leg, initial encounter: Secondary | ICD-10-CM | POA: Diagnosis present

## 2017-07-22 DIAGNOSIS — Z7982 Long term (current) use of aspirin: Secondary | ICD-10-CM | POA: Diagnosis not present

## 2017-07-22 DIAGNOSIS — Z87891 Personal history of nicotine dependence: Secondary | ICD-10-CM | POA: Insufficient documentation

## 2017-07-22 DIAGNOSIS — Z96651 Presence of right artificial knee joint: Secondary | ICD-10-CM | POA: Insufficient documentation

## 2017-07-22 DIAGNOSIS — Z79899 Other long term (current) drug therapy: Secondary | ICD-10-CM | POA: Insufficient documentation

## 2017-07-22 MED ORDER — OXYCODONE HCL 5 MG PO TABS: 5.0000 mg | ORAL_TABLET | Freq: Four times a day (QID) | ORAL | 0 refills | Status: DC | PRN

## 2017-07-22 MED ORDER — OXYCODONE-ACETAMINOPHEN 5-325 MG PO TABS
1.0000 | ORAL_TABLET | Freq: Once | ORAL | Status: AC
  Administered 2017-07-22: 1 via ORAL
  Filled 2017-07-22: qty 1

## 2017-07-22 NOTE — ED Triage Notes (Addendum)
Pt had R knee replacement done 2/11 and had been doing well with ambulation and walker. She was home and putting away items and caught her leg and she fell. R knee pain 10/10 received 50mcg Fent IM by EMS

## 2017-07-22 NOTE — ED Provider Notes (Signed)
[facility] EMERGENCY DEPARTMENT Provider Note   CSN: [id number] Arrival date & time: 07/22/17  1950     History   Chief Complaint Chief Complaint  Patient presents with  . Fall    HPI Christine Reyes is a 62 y.o. female.  Presents to the emergency department for evaluation of mechanical fall earlier today.  Patient is status post right total knee arthroplasty performed 06/22/2017 by Dr. Orson Slick in [place].  Patient has been progressing well with home health physical therapy.  States she is ambulating well with a walker but has been unable to actively extend the knee due to weakness with her quadriceps.  Patient states she had a mechanical fall earlier today and when she fell anteriorly onto her knee.  She describes diffuse knee pain and swelling.  Prior to her fall today she was taking oxycodone for postoperative pain, 5 mg 2-3 times daily as needed for pain.  Patient's pain is located throughout the right knee she denies any bleeding along the incision site.  She denies any other injury to her body.  No head injury, loss of consciousness, nausea or vomiting.  HPI  Past Medical History:  Diagnosis Date  . Anxiety   . Arthritis   . Headache   . Hypertension   . Obesity   . Sleep apnea     Patient Active Problem List   Diagnosis Date Noted  . S/P TKR (total knee replacement), right 06/22/2017    Past Surgical History:  Procedure Laterality Date  . ABDOMINAL HYSTERECTOMY    . BREAST EXCISIONAL BIOPSY Right 1989  . COLONOSCOPY WITH ESOPHAGOGASTRODUODENOSCOPY (EGD)    . csection x2    . DILATION AND CURETTAGE OF UTERUS    . ESOPHAGOGASTRODUODENOSCOPY (EGD) WITH PROPOFOL N/A 12/14/2015   Procedure: ESOPHAGOGASTRODUODENOSCOPY (EGD) WITH PROPOFOL;  Surgeon: Scot Jun, MD;  Location: [facility] ENDOSCOPY;  Service: Endoscopy;  Laterality: N/A;  . GASTRIC BYPASS    . JOINT REPLACEMENT    . left knee surgery    . TOTAL KNEE ARTHROPLASTY Right 06/22/2017   Procedure: TOTAL KNEE ARTHROPLASTY;  Surgeon: Lyndle Herrlich, MD;  Location: [facility] ORS;  Service: Orthopedics;  Laterality: Right;    OB History    No data available       Home Medications    Prior to Admission medications   Medication Sig Start Date End Date Taking? Authorizing Provider  acetaminophen (TYLENOL) 325 MG tablet Take 2 tablets (650 mg total) by mouth every 4 (four) hours as needed for mild pain ((score 1 to 3) or temp > 100.5). 06/24/17   Lyndle Herrlich, MD  aspirin EC 325 MG EC tablet Take 1 tablet (325 mg total) by mouth daily with breakfast. 06/25/17   Lyndle Herrlich, MD  cholecalciferol (VITAMIN D) 1000 units tablet Take 1,000 Units by mouth daily.    [provider]  docusate sodium (COLACE) 100 MG capsule Take 1 capsule (100 mg total) by mouth 2 (two) times daily. 06/24/17   Lyndle Herrlich, MD  gabapentin (NEURONTIN) 300 MG capsule Take 300 mg by mouth 3 (three) times daily.    [provider]  hydrochlorothiazide (HYDRODIURIL) 25 MG tablet Take 1 tablet (25 mg total) by mouth daily for 30 doses. 06/25/17 07/25/17  Lyndle Herrlich, MD  irbesartan (AVAPRO) 300 MG tablet Take 1 tablet (300 mg total) by mouth daily for 30 doses. 06/25/17 07/25/17  Lyndle Herrlich, MD  magnesium gluconate (MAGONATE) 500 MG tablet  Take 500 mg by mouth daily.    [provider]  metoCLOPramide (REGLAN) 5 MG tablet Take 1-2 tablets (5-10 mg total) by mouth every 8 (eight) hours as needed for up to 30 doses for nausea (if ondansetron (ZOFRAN) ineffective.). 06/24/17   Lyndle Herrlich, MD  Multiple Vitamins-Minerals (MULTIVITAMIN WITH MINERALS) tablet Take 1 tablet by mouth daily.    [provider]  olmesartan-hydrochlorothiazide (BENICAR HCT) 40-25 MG tablet Take 1 tablet by mouth daily. 05/20/17   [provider]  ondansetron (ZOFRAN) 4 MG tablet Take 1 tablet (4 mg total) by mouth every 6 (six) hours as needed for nausea. 06/24/17   Lyndle Herrlich, MD    oxyCODONE (OXY IR/ROXICODONE) 5 MG immediate release tablet Take 1 tablet (5 mg total) by mouth every 3 (three) hours as needed for moderate pain ((score 4 to 6)). 06/24/17   Lyndle Herrlich, MD  oxyCODONE (ROXICODONE) 5 MG immediate release tablet Take 1 tablet (5 mg total) by mouth every 6 (six) hours as needed for severe pain or breakthrough pain. 07/22/17 07/22/18  Evon Slack, PA-C  pantoprazole (PROTONIX) 40 MG tablet Take 40 mg by mouth 2 (two) times daily as needed (heartburn).     [provider]  senna (SENOKOT) 8.6 MG TABS tablet Take 1 tablet (8.6 mg total) by mouth 2 (two) times daily. 06/24/17   Lyndle Herrlich, MD  Simethicone (GAS-X EXTRA STRENGTH) 125 MG CAPS Take 1 capsule by mouth 3 (three) times daily as needed (gas pains).    [provider]  ZINC SULFATE PO Take by mouth.    [provider]    Family History Family History  Problem Relation Age of Onset  . Breast cancer Neg Hx     Social History Social History   Tobacco Use  . Smoking status: Former Smoker    Packs/day: 0.50    Last attempt to quit: 06/11/2015    Years since quitting: 2.1  . Smokeless tobacco: Former Engineer, water Use Topics  . Alcohol use: Yes    Alcohol/week: 1.2 oz    Types: 2 Glasses of wine per week  . Drug use: No     Allergies   Morphine and related and Tramadol   Review of Systems Review of Systems  Constitutional: Negative for activity change.  Eyes: Negative for pain and visual disturbance.  Respiratory: Negative for shortness of breath.   Cardiovascular: Negative for chest pain and leg swelling.  Gastrointestinal: Negative for abdominal pain.  Genitourinary: Negative for flank pain and pelvic pain.  Musculoskeletal: Positive for arthralgias, gait problem and joint swelling. Negative for myalgias, neck pain and neck stiffness.  Skin: Negative for wound.  Neurological: Negative for dizziness, syncope, weakness, light-headedness, numbness and  headaches.  Psychiatric/Behavioral: Negative for confusion and decreased concentration.     Physical Exam Updated Vital Signs BP (!) 162/72 (BP Location: Left Arm)   Pulse 68   Temp 98.5 F (36.9 C) (Oral)   Resp 18   Ht 4\' 7"  (1.397 m)   Wt 78.9 kg (174 lb)   SpO2 100%   BMI 40.44 kg/m   Physical Exam  Constitutional: She is oriented to person, place, and time. She appears well-developed and well-nourished.  HENT:  Head: Normocephalic and atraumatic.  Eyes: Conjunctivae are normal.  Neck: Normal range of motion.  Cardiovascular: Normal rate.  Pulmonary/Chest: Effort normal. No respiratory distress.  Musculoskeletal:  Examination of the right lower extremity shows patient has no  pain with logrolling of the right hip.  She has no tenderness throughout the shaft of the femur and distal femur.  Patient has mild tenderness along the medial lateral joint line and anterior knee on the right side.  Knee is stable to valgus and varus stress testing.  Patient's incision site is completely healed with no signs of dehiscence, warmth, erythema, drainage.  She has no swelling edema or warmth throughout the right leg.  Negative Homans sign.  Patient is unable to straight leg raise, there is no palpable deformity in the quads or patellar tendon.  Neurological: She is alert and oriented to person, place, and time.  Skin: Skin is warm. No rash noted.  Psychiatric: She has a normal mood and affect. Her behavior is normal. Thought content normal.     ED Treatments / Results  Labs (all labs ordered are listed, but only abnormal results are displayed) Labs Reviewed - No data to display  EKG  EKG Interpretation None       Radiology Dg Knee Complete 4 Views Right  Result Date: 07/22/2017 CLINICAL DATA:  Fall, knee pain EXAM: RIGHT KNEE - COMPLETE 4+ VIEW COMPARISON:  06/23/2007 FINDINGS: No evidence of fracture, dislocation, or joint effusion. No evidence of arthropathy or other focal bone  abnormality. Soft tissues are unremarkable. IMPRESSION: Negative. Electronically Signed   By: Charlett NoseKevin  Dover M.D.   On: 07/22/2017 20:53    Procedures Procedures (including critical care time)  Medications Ordered in ED Medications  oxyCODONE-acetaminophen (PERCOCET/ROXICET) 5-325 MG per tablet 1 tablet (1 tablet Oral Given 07/22/17 2117)     Initial Impression / Assessment and Plan / ED Course  I have reviewed the triage vital signs and the nursing notes.  Pertinent labs & imaging results that were available during my care of the patient were reviewed by me and considered in my medical decision making (see chart for details).     62 year old female status post right total knee arthroplasty on 06/22/2017.  She has been doing well making progress with home health PT.  States she has been unable to straight leg raise since the surgery.  She is unable to straight leg raise today which is some concern for possible patellar tendon rupture or quads tendon rupture but I do not feel any palpable defect today on exam.  Patella seems to be tracking well in the x-rays and does not appear to be too elevated or too low indicating any extensor mechanism rupture.  Patient's collaterals appear to be intact with no ligamentous laxity.  Patient will continue with Polar Care unit, rest ice and elevate the knee.  She can work on gentle knee range of motion.  She will continue with oxycodone but may need to increase to 5 mg every 4-6 hours versus 2-3 tablets a day which is what she has been taking.  Patient advised to call her orthopedist tomorrow morning to schedule follow-up appointment.  She is given a walker today to help with ambulation.  She is educated on signs and symptoms return to ED for. Final Clinical Impressions(s) / ED Diagnoses   Final diagnoses:  Contusion of right knee, initial encounter    ED Discharge Orders        Ordered    oxyCODONE (ROXICODONE) 5 MG immediate release tablet  Every 6 hours  PRN     07/22/17 2130       Ronnette JuniperGaines, Scheff C, PA-C 07/22/17 2139    Minna AntisPaduchowski, Kevin, MD 07/22/17 2259

## 2017-07-22 NOTE — Discharge Instructions (Signed)
Please ambulate with walker.  Rest ice and elevate the knee.  Continue with oxycodone 5 mg 1 tab p.o. every 4-6 hours as needed for pain.  Call orthopedic office first thing tomorrow morning to schedule follow-up appointment for repeat evaluation.  Return to the ER for any worsening symptoms or urgent changes in your health.

## 2017-08-10 ENCOUNTER — Encounter: Payer: Self-pay | Admitting: Orthopedic Surgery

## 2017-08-11 ENCOUNTER — Encounter: Payer: Self-pay | Admitting: Orthopedic Surgery

## 2018-03-31 DIAGNOSIS — Z6833 Body mass index (BMI) 33.0-33.9, adult: Secondary | ICD-10-CM | POA: Insufficient documentation

## 2018-03-31 DIAGNOSIS — Z9884 Bariatric surgery status: Secondary | ICD-10-CM | POA: Insufficient documentation

## 2018-06-17 ENCOUNTER — Inpatient Hospital Stay: Payer: 59

## 2018-06-17 ENCOUNTER — Inpatient Hospital Stay: Payer: 59 | Attending: Oncology | Admitting: Oncology

## 2018-06-17 ENCOUNTER — Encounter (INDEPENDENT_AMBULATORY_CARE_PROVIDER_SITE_OTHER): Payer: Self-pay

## 2018-06-17 ENCOUNTER — Encounter: Payer: Self-pay | Admitting: Oncology

## 2018-06-17 ENCOUNTER — Other Ambulatory Visit: Payer: Self-pay

## 2018-06-17 VITALS — BP 155/103 | HR 80 | Temp 98.5°F | Resp 16 | Ht 59.0 in | Wt 171.4 lb

## 2018-06-17 DIAGNOSIS — Z9884 Bariatric surgery status: Secondary | ICD-10-CM | POA: Diagnosis not present

## 2018-06-17 DIAGNOSIS — D509 Iron deficiency anemia, unspecified: Secondary | ICD-10-CM

## 2018-06-17 DIAGNOSIS — Z87891 Personal history of nicotine dependence: Secondary | ICD-10-CM | POA: Diagnosis not present

## 2018-06-17 DIAGNOSIS — R5383 Other fatigue: Secondary | ICD-10-CM

## 2018-06-17 HISTORY — DX: Iron deficiency anemia, unspecified: D50.9

## 2018-06-17 LAB — FERRITIN: Ferritin: 3 ng/mL — ABNORMAL LOW (ref 11–307)

## 2018-06-17 LAB — CBC WITH DIFFERENTIAL/PLATELET
ABS IMMATURE GRANULOCYTES: 0.02 10*3/uL (ref 0.00–0.07)
Basophils Absolute: 0.1 10*3/uL (ref 0.0–0.1)
Basophils Relative: 1 %
Eosinophils Absolute: 0 10*3/uL (ref 0.0–0.5)
Eosinophils Relative: 1 %
HCT: 27.9 % — ABNORMAL LOW (ref 36.0–46.0)
Hemoglobin: 8.3 g/dL — ABNORMAL LOW (ref 12.0–15.0)
Immature Granulocytes: 0 %
LYMPHS PCT: 36 %
Lymphs Abs: 3.1 10*3/uL (ref 0.7–4.0)
MCH: 22.7 pg — ABNORMAL LOW (ref 26.0–34.0)
MCHC: 29.7 g/dL — ABNORMAL LOW (ref 30.0–36.0)
MCV: 76.2 fL — ABNORMAL LOW (ref 80.0–100.0)
Monocytes Absolute: 0.6 10*3/uL (ref 0.1–1.0)
Monocytes Relative: 7 %
NRBC: 0 % (ref 0.0–0.2)
Neutro Abs: 4.8 10*3/uL (ref 1.7–7.7)
Neutrophils Relative %: 55 %
Platelets: 437 10*3/uL — ABNORMAL HIGH (ref 150–400)
RBC: 3.66 MIL/uL — ABNORMAL LOW (ref 3.87–5.11)
RDW: 19.6 % — ABNORMAL HIGH (ref 11.5–15.5)
WBC: 8.5 10*3/uL (ref 4.0–10.5)

## 2018-06-17 LAB — IRON AND TIBC
Iron: 20 ug/dL — ABNORMAL LOW (ref 28–170)
Saturation Ratios: 4 % — ABNORMAL LOW (ref 10.4–31.8)
TIBC: 522 ug/dL — ABNORMAL HIGH (ref 250–450)
UIBC: 502 ug/dL

## 2018-06-17 LAB — FOLATE: FOLATE: 9.1 ng/mL (ref >5.9)

## 2018-06-17 LAB — VITAMIN B12: Vitamin B-12: 533 pg/mL (ref 180–914)

## 2018-06-17 NOTE — Progress Notes (Signed)
Hematology/Oncology Consult note [facility] [phone] [phone]   Patient Care Team: Barbette Reichmann, MD as PCP - General (Internal Medicine)  REFERRING PROVIDER: Barbette Reichmann, MD  CHIEF COMPLAINTS/REASON FOR VISIT:  Evaluation of iron deficiency anemia  HISTORY OF PRESENTING ILLNESS:  Christine Reyes is a  63 y.o.  female with PMH listed below who was referred to me for evaluation of iron deficiency anemia Reviewed patient's recent labs that was done at [facility] office.  05/19/2018 labs revealed anemia with hemoglobin of 3.9, MCV 77.6.  Normal WBC.  Platelet count elevated at 468.  04/29/2018 ferritin 3, Reviewed patient's previous labs ordered by primary care physician's office,duration is since December 2019. No aggravating or improving factors.  Associated signs and symptoms: Patient reports fatigue, and feeling cold..  Denies SOB with exertion.  Denies weight loss, easy bruising, hematochezia, hemoptysis, hematuria. Context:  History of iron deficiency: Denies Rectal bleeding: Denies Menstrual bleeding/ Vaginal bleeding : Denies Hematemesis or hemoptysis : denies Blood in urine : denies  Pica: Eating ice chips. History of gastric bypass. Fatigue: Yes.  SOB: deneis    Review of Systems  Constitutional: Positive for fatigue. Negative for appetite change, chills and fever.  HENT:   Negative for hearing loss and voice change.   Eyes: Negative for eye problems.  Respiratory: Negative for chest tightness and cough.   Cardiovascular: Negative for chest pain.  Gastrointestinal: Negative for abdominal distention, abdominal pain and blood in stool.  Endocrine: Negative for hot flashes.  Genitourinary: Negative for difficulty urinating and frequency.   Musculoskeletal: Negative for arthralgias.  Skin: Negative for itching and rash.  Neurological: Negative for extremity weakness.  Hematological: Negative for adenopathy.    Psychiatric/Behavioral: Negative for confusion.    MEDICAL HISTORY:  Past Medical History:  Diagnosis Date  . Anxiety   . Arthritis   . Headache   . Hypertension   . Iron deficiency anemia 06/17/2018  . Obesity   . Sleep apnea     SURGICAL HISTORY: Past Surgical History:  Procedure Laterality Date  . ABDOMINAL HYSTERECTOMY    . BREAST EXCISIONAL BIOPSY Right 1989  . COLONOSCOPY WITH ESOPHAGOGASTRODUODENOSCOPY (EGD)    . csection x2    . DILATION AND CURETTAGE OF UTERUS    . ESOPHAGOGASTRODUODENOSCOPY (EGD) WITH PROPOFOL N/A 12/14/2015   Procedure: ESOPHAGOGASTRODUODENOSCOPY (EGD) WITH PROPOFOL;  Surgeon: Scot Jun, MD;  Location: [facility] ENDOSCOPY;  Service: Endoscopy;  Laterality: N/A;  . GASTRIC BYPASS    . JOINT REPLACEMENT    . left knee surgery    . TOTAL KNEE ARTHROPLASTY Right 06/22/2017   Procedure: TOTAL KNEE ARTHROPLASTY;  Surgeon: Lyndle Herrlich, MD;  Location: [facility] ORS;  Service: Orthopedics;  Laterality: Right;    SOCIAL HISTORY: Social History   Socioeconomic History  . Marital status: Married    Spouse name: Not on file  . Number of children: Not on file  . Years of education: Not on file  . Highest education level: Not on file  Occupational History  . Not on file  Social Needs  . Financial resource strain: Not on file  . Food insecurity:    Worry: Not on file    Inability: Not on file  . Transportation needs:    Medical: Not on file    Non-medical: Not on file  Tobacco Use  . Smoking status: Former Smoker    Packs/day: 0.50    Last attempt to quit: 06/11/2015    Years since quitting:  3.0  . Smokeless tobacco: Former Engineer, waterUser  Substance and Sexual Activity  . Alcohol use: Yes    Alcohol/week: 2.0 standard drinks    Types: 2 Glasses of wine per week  . Drug use: No  . Sexual activity: Not on file  Lifestyle  . Physical activity:    Days per week: Not on file    Minutes per session: Not on file  . Stress: Not on file  Relationships  .  Social connections:    Talks on phone: Not on file    Gets together: Not on file    Attends religious service: Not on file    Active member of club or organization: Not on file    Attends meetings of clubs or organizations: Not on file    Relationship status: Not on file  . Intimate partner violence:    Fear of current or ex partner: Not on file    Emotionally abused: Not on file    Physically abused: Not on file    Forced sexual activity: Not on file  Other Topics Concern  . Not on file  Social History Narrative  . Not on file    FAMILY HISTORY: Family History  Problem Relation Age of Onset  . Breast cancer Neg Hx     ALLERGIES:  is allergic to morphine and related and tramadol.  MEDICATIONS:  Current Outpatient Medications  Medication Sig Dispense Refill  . acetaminophen (TYLENOL) 325 MG tablet Take 2 tablets (650 mg total) by mouth every 4 (four) hours as needed for mild pain ((score 1 to 3) or temp > 100.5). 30 tablet 0  . aspirin EC 325 MG EC tablet Take 1 tablet (325 mg total) by mouth daily with breakfast. 30 tablet 0  . cholecalciferol (VITAMIN D) 1000 units tablet Take 1,000 Units by mouth daily.    Marland Kitchen. docusate sodium (COLACE) 100 MG capsule Take 1 capsule (100 mg total) by mouth 2 (two) times daily. 10 capsule 0  . gabapentin (NEURONTIN) 300 MG capsule Take 300 mg by mouth 3 (three) times daily.    . hydrochlorothiazide (HYDRODIURIL) 25 MG tablet Take 1 tablet (25 mg total) by mouth daily for 30 doses. 30 tablet 0  . magnesium gluconate (MAGONATE) 500 MG tablet Take 500 mg by mouth daily.    . Multiple Vitamins-Minerals (MULTIVITAMIN WITH MINERALS) tablet Take 1 tablet by mouth daily.    Marland Kitchen. olmesartan-hydrochlorothiazide (BENICAR HCT) 40-25 MG tablet Take 1 tablet by mouth daily.  5  . ondansetron (ZOFRAN) 4 MG tablet Take 1 tablet (4 mg total) by mouth every 6 (six) hours as needed for nausea. 20 tablet 0  . oxyCODONE (OXY IR/ROXICODONE) 5 MG immediate release tablet  Take 1 tablet (5 mg total) by mouth every 3 (three) hours as needed for moderate pain ((score 4 to 6)). 30 tablet 0  . pantoprazole (PROTONIX) 40 MG tablet Take 40 mg by mouth 2 (two) times daily as needed (heartburn).     . senna (SENOKOT) 8.6 MG TABS tablet Take 1 tablet (8.6 mg total) by mouth 2 (two) times daily. 120 each 0  . Simethicone (GAS-X EXTRA STRENGTH) 125 MG CAPS Take 1 capsule by mouth 3 (three) times daily as needed (gas pains).    Marland Kitchen. ZINC SULFATE PO Take by mouth.     No current facility-administered medications for this visit.      PHYSICAL EXAMINATION: ECOG PERFORMANCE STATUS: 1 - Symptomatic but completely ambulatory Vitals:   06/17/18 0953 06/17/18  1001  BP:  (!) 155/103  Pulse:  80  Resp: 16   Temp:  98.5 F (36.9 C)   Filed Weights   06/17/18 0953  Weight: 171 lb 6.4 oz (77.7 kg)    Physical Exam Constitutional:      General: She is not in acute distress. HENT:     Head: Normocephalic and atraumatic.  Eyes:     General: No scleral icterus.    Pupils: Pupils are equal, round, and reactive to light.  Neck:     Musculoskeletal: Normal range of motion and neck supple.  Cardiovascular:     Rate and Rhythm: Normal rate and regular rhythm.     Heart sounds: Normal heart sounds.  Pulmonary:     Effort: Pulmonary effort is normal. No respiratory distress.     Breath sounds: No wheezing.  Abdominal:     General: Bowel sounds are normal. There is no distension.     Palpations: Abdomen is soft. There is no mass.     Tenderness: There is no abdominal tenderness.  Musculoskeletal: Normal range of motion.        General: No deformity.  Skin:    General: Skin is warm and dry.     Findings: No erythema or rash.  Neurological:     Mental Status: She is alert and oriented to person, place, and time.     Cranial Nerves: No cranial nerve deficit.     Coordination: Coordination normal.  Psychiatric:        Behavior: Behavior normal.        Thought Content:  Thought content normal.       CMP Latest Ref Rng & Units 06/23/2017  Glucose 65 - 99 mg/dL 983(J)  BUN 6 - 20 mg/dL 82(N)  Creatinine 0.53 - 1.00 mg/dL 9.76(B)  Sodium 341 - 937 mmol/L 137  Potassium 3.5 - 5.1 mmol/L 4.2  Chloride 101 - 111 mmol/L 103  CO2 22 - 32 mmol/L 26  Calcium 8.9 - 10.3 mg/dL 9.0(W)   CBC Latest Ref Rng & Units 06/17/2018  WBC 4.0 - 10.5 K/uL 8.5  Hemoglobin 12.0 - 15.0 g/dL 8.3(L)  Hematocrit 36.0 - 46.0 % 27.9(L)  Platelets 150 - 400 K/uL 437(H)     LABORATORY DATA:  I have reviewed the data as listed Lab Results  Component Value Date   WBC 8.5 06/17/2018   HGB 8.3 (L) 06/17/2018   HCT 27.9 (L) 06/17/2018   MCV 76.2 (L) 06/17/2018   PLT 437 (H) 06/17/2018   Recent Labs    06/23/17 0655  NA 137  K 4.2  CL 103  CO2 26  GLUCOSE 103*  BUN 37*  CREATININE 1.26*  CALCIUM 8.5*  GFRNONAA 45*  GFRAA 52*   Iron/TIBC/Ferritin/ %Sat    Component Value Date/Time   IRON 20 (L) 06/17/2018 1044   TIBC 522 (H) 06/17/2018 1044   FERRITIN 3 (L) 06/17/2018 1044   IRONPCTSAT 4 (L) 06/17/2018 1044     No results found.    ASSESSMENT & PLAN:  1. History of gastric bypass   2. Iron deficiency anemia, unspecified iron deficiency anemia type    Labs are reviewed and discussed with patient. Consistent with iron deficiency anemia, likely malabsorption secondary to gastric bypass.  Check folate, B12, ferritin, iron TIBC, CBC Plan IV iron with Venofer 200mg  weekly x 4 doses. Allergy reactions/infusion reaction including anaphylactic reaction discussed with patient. Other side effects include but not limited to high blood pressure, skin  rash, weight gain, leg swelling, etc. Patient voices understanding and willing to proceed.    Orders Placed This Encounter  Procedures  . CBC with Differential/Platelet    Standing Status:   Future    Number of Occurrences:   1    Standing Expiration Date:   06/17/2019  . Ferritin    Standing Status:   Future     Number of Occurrences:   1    Standing Expiration Date:   06/18/2019  . Iron and TIBC    Standing Status:   Future    Number of Occurrences:   1    Standing Expiration Date:   06/18/2019  . Vitamin B12    Standing Status:   Future    Number of Occurrences:   1    Standing Expiration Date:   06/18/2019  . Folate    Standing Status:   Future    Number of Occurrences:   1    Standing Expiration Date:   06/18/2019  . CBC with Differential/Platelet    Standing Status:   Future    Standing Expiration Date:   06/18/2019  . Iron and TIBC    Standing Status:   Future    Standing Expiration Date:   06/18/2019  . Ferritin    Standing Status:   Future    Standing Expiration Date:   06/18/2019    All questions were answered. The patient knows to call the clinic with any problems questions or concerns.  Return of visit: 8 weeks Thank you for this kind referral and the opportunity to participate in the care of this patient. A copy of today's note is routed to referring provider  Total face to face encounter time for this patient visit was 45min. >50% of the time was  spent in counseling and coordination of care.    Rickard PatienceZhou Druanne Bosques, MD, PhD Hematology Oncology [facility] Pager- [phone] 06/17/2018

## 2018-06-17 NOTE — Progress Notes (Signed)
Patient here for initial visit, she reports that "she is always cold", and has been eating ice, she also states that she is very irritable for the past few months.

## 2018-06-22 ENCOUNTER — Inpatient Hospital Stay: Payer: 59

## 2018-06-22 VITALS — BP 134/72 | HR 87 | Temp 96.0°F | Resp 18

## 2018-06-22 DIAGNOSIS — D509 Iron deficiency anemia, unspecified: Secondary | ICD-10-CM

## 2018-06-22 MED ORDER — SODIUM CHLORIDE 0.9 % IV SOLN
Freq: Once | INTRAVENOUS | Status: AC
  Administered 2018-06-22: 14:00:00 via INTRAVENOUS
  Filled 2018-06-22: qty 250

## 2018-06-22 MED ORDER — IRON SUCROSE 20 MG/ML IV SOLN
200.0000 mg | Freq: Once | INTRAVENOUS | Status: AC
  Administered 2018-06-22: 200 mg via INTRAVENOUS
  Filled 2018-06-22: qty 10

## 2018-06-29 ENCOUNTER — Inpatient Hospital Stay: Payer: 59

## 2018-06-29 VITALS — BP 115/78 | HR 81 | Resp 20

## 2018-06-29 DIAGNOSIS — D509 Iron deficiency anemia, unspecified: Secondary | ICD-10-CM

## 2018-06-29 MED ORDER — SODIUM CHLORIDE 0.9 % IV SOLN
Freq: Once | INTRAVENOUS | Status: AC
  Administered 2018-06-29: 15:00:00 via INTRAVENOUS
  Filled 2018-06-29: qty 250

## 2018-06-29 MED ORDER — IRON SUCROSE 20 MG/ML IV SOLN
200.0000 mg | Freq: Once | INTRAVENOUS | Status: AC
  Administered 2018-06-29: 200 mg via INTRAVENOUS
  Filled 2018-06-29: qty 10

## 2018-07-06 ENCOUNTER — Inpatient Hospital Stay: Payer: 59

## 2018-07-13 ENCOUNTER — Inpatient Hospital Stay: Payer: 59 | Attending: Oncology

## 2018-07-13 VITALS — BP 164/92 | HR 80 | Temp 96.0°F | Resp 20

## 2018-07-13 DIAGNOSIS — D509 Iron deficiency anemia, unspecified: Secondary | ICD-10-CM | POA: Insufficient documentation

## 2018-07-13 MED ORDER — IRON SUCROSE 20 MG/ML IV SOLN
200.0000 mg | Freq: Once | INTRAVENOUS | Status: AC
  Administered 2018-07-13: 200 mg via INTRAVENOUS
  Filled 2018-07-13: qty 10

## 2018-07-13 MED ORDER — SODIUM CHLORIDE 0.9 % IV SOLN
Freq: Once | INTRAVENOUS | Status: AC
  Administered 2018-07-13: 15:00:00 via INTRAVENOUS
  Filled 2018-07-13: qty 250

## 2018-08-06 ENCOUNTER — Telehealth: Payer: Self-pay

## 2018-08-06 NOTE — Telephone Encounter (Signed)
He has option of getting labs done and do tele visit.  Or please reschedule visit in 4 weeks. Thank you

## 2018-08-06 NOTE — Telephone Encounter (Signed)
Called patient to do her travel screening and she stated that she wanted to reschedule. Please contact patient. Thank you.

## 2018-08-06 NOTE — Telephone Encounter (Signed)
Spoke to patient and she does not want to come in at all. Message sent to scheduling to reschedule appointment for 4 weeks.

## 2018-08-09 ENCOUNTER — Inpatient Hospital Stay: Payer: 59

## 2018-08-10 ENCOUNTER — Inpatient Hospital Stay: Payer: 59 | Admitting: Oncology

## 2018-08-10 ENCOUNTER — Inpatient Hospital Stay: Payer: 59

## 2018-09-05 ENCOUNTER — Other Ambulatory Visit: Payer: Self-pay

## 2018-09-06 ENCOUNTER — Inpatient Hospital Stay: Payer: 59 | Attending: Oncology | Admitting: *Deleted

## 2018-09-06 ENCOUNTER — Other Ambulatory Visit: Payer: Self-pay

## 2018-09-06 DIAGNOSIS — D509 Iron deficiency anemia, unspecified: Secondary | ICD-10-CM | POA: Diagnosis present

## 2018-09-06 DIAGNOSIS — Z9884 Bariatric surgery status: Secondary | ICD-10-CM

## 2018-09-06 LAB — CBC WITH DIFFERENTIAL/PLATELET
Abs Immature Granulocytes: 0.01 10*3/uL (ref 0.00–0.07)
Basophils Absolute: 0 10*3/uL (ref 0.0–0.1)
Basophils Relative: 1 %
Eosinophils Absolute: 0 10*3/uL (ref 0.0–0.5)
Eosinophils Relative: 1 %
HCT: 35.6 % — ABNORMAL LOW (ref 36.0–46.0)
Hemoglobin: 11.2 g/dL — ABNORMAL LOW (ref 12.0–15.0)
Immature Granulocytes: 0 %
Lymphocytes Relative: 43 %
Lymphs Abs: 2.8 10*3/uL (ref 0.7–4.0)
MCH: 29.2 pg (ref 26.0–34.0)
MCHC: 31.5 g/dL (ref 30.0–36.0)
MCV: 92.7 fL (ref 80.0–100.0)
Monocytes Absolute: 0.5 10*3/uL (ref 0.1–1.0)
Monocytes Relative: 7 %
Neutro Abs: 3.2 10*3/uL (ref 1.7–7.7)
Neutrophils Relative %: 48 %
Platelets: 321 10*3/uL (ref 150–400)
RBC: 3.84 MIL/uL — ABNORMAL LOW (ref 3.87–5.11)
RDW: 15.6 % — ABNORMAL HIGH (ref 11.5–15.5)
WBC: 6.6 10*3/uL (ref 4.0–10.5)
nRBC: 0 % (ref 0.0–0.2)

## 2018-09-06 LAB — IRON AND TIBC
Iron: 91 ug/dL (ref 28–170)
Saturation Ratios: 22 % (ref 10.4–31.8)
TIBC: 420 ug/dL (ref 250–450)
UIBC: 329 ug/dL

## 2018-09-06 LAB — FERRITIN: Ferritin: 19 ng/mL (ref 11–307)

## 2018-09-07 ENCOUNTER — Inpatient Hospital Stay: Payer: 59

## 2018-09-07 ENCOUNTER — Other Ambulatory Visit: Payer: Self-pay

## 2018-09-07 ENCOUNTER — Encounter: Payer: Self-pay | Admitting: Oncology

## 2018-09-07 ENCOUNTER — Inpatient Hospital Stay (HOSPITAL_BASED_OUTPATIENT_CLINIC_OR_DEPARTMENT_OTHER): Payer: 59 | Admitting: Oncology

## 2018-09-07 DIAGNOSIS — D509 Iron deficiency anemia, unspecified: Secondary | ICD-10-CM | POA: Diagnosis not present

## 2018-09-07 DIAGNOSIS — Z9884 Bariatric surgery status: Secondary | ICD-10-CM

## 2018-09-07 MED ORDER — IRON-VITAMIN C 65-125 MG PO TABS: 1.0000 | ORAL_TABLET | Freq: Every day | ORAL | 0 refills | Status: DC

## 2018-09-07 NOTE — Progress Notes (Signed)
Patient contacted for webex visit. No concerns voiced.

## 2018-09-07 NOTE — Progress Notes (Signed)
HEMATOLOGY-ONCOLOGY TeleHEALTH VISIT PROGRESS NOTE  I connected with Christine Reyes R Frazee on 09/07/18 at  9:15 AM EDT by video enabled telemedicine visit and verified that I am speaking with the correct person using two identifiers. I discussed the limitations, risks, security and privacy concerns of performing an evaluation and management service by telemedicine and the availability of in-person appointments. I also discussed with the patient that there may be a patient responsible charge related to this service. The patient expressed understanding and agreed to proceed.   Other persons participating in the visit and their role in the encounter:  Diamantina MonksJulie Eastwood, CMA, check in patient   Merleen NicelyElizabeth Santos, RN, check in patient.   Patient's location: Home  Provider's location: home  Chief Complaint: 2 months follow-up for iron deficiency anemia, history of gastric bypass.   INTERVAL HISTORY Christine Reyes is a 63 y.o. female who has above history reviewed by me today presents for follow up visit for management of iron deficiency anemia, history of gastric bypass Problems and complaints are listed below:  Patient received IV Venofer since last visit.  Reports feeling significantly better.  Fatigue has improved. No new complaint.  Review of Systems  Constitutional: Negative for appetite change, chills, fatigue and fever.  HENT:   Negative for hearing loss and voice change.   Eyes: Negative for eye problems.  Respiratory: Negative for chest tightness and cough.   Cardiovascular: Negative for chest pain.  Gastrointestinal: Negative for abdominal distention, abdominal pain and blood in stool.  Endocrine: Negative for hot flashes.  Genitourinary: Negative for difficulty urinating and frequency.   Musculoskeletal: Negative for arthralgias.  Skin: Negative for itching and rash.  Neurological: Negative for extremity weakness.  Hematological: Negative for adenopathy.  Psychiatric/Behavioral: Negative  for confusion.    Past Medical History:  Diagnosis Date  . Anxiety   . Arthritis   . Headache   . Hypertension   . Iron deficiency anemia 06/17/2018  . Obesity   . Sleep apnea    Past Surgical History:  Procedure Laterality Date  . ABDOMINAL HYSTERECTOMY    . BREAST EXCISIONAL BIOPSY Right 1989  . COLONOSCOPY WITH ESOPHAGOGASTRODUODENOSCOPY (EGD)    . csection x2    . DILATION AND CURETTAGE OF UTERUS    . ESOPHAGOGASTRODUODENOSCOPY (EGD) WITH PROPOFOL N/A 12/14/2015   Procedure: ESOPHAGOGASTRODUODENOSCOPY (EGD) WITH PROPOFOL;  Surgeon: Scot Junobert T Elliott, MD;  [facility];  Service: Endoscopy;  Laterality: N/A;  . GASTRIC BYPASS    . JOINT REPLACEMENT    . left knee surgery    . TOTAL KNEE ARTHROPLASTY Right 06/22/2017   Procedure: TOTAL KNEE ARTHROPLASTY;  Surgeon: Lyndle HerrlichBowers, James R, MD;  Location: [facility] ORS;  Service: Orthopedics;  Laterality: Right;    Family History  Problem Relation Age of Onset  . Breast cancer Neg Hx     Social History   Socioeconomic History  . Marital status: Married    Spouse name: Not on file  . Number of children: Not on file  . Years of education: Not on file  . Highest education level: Not on file  Occupational History  . Not on file  Social Needs  . Financial resource strain: Not on file  . Food insecurity:    Worry: Not on file    Inability: Not on file  . Transportation needs:    Medical: Not on file    Non-medical: Not on file  Tobacco Use  . Smoking status: Former Smoker    Packs/day: 0.50  Last attempt to quit: 06/11/2015    Years since quitting: 3.2  . Smokeless tobacco: Former Engineer, water and Sexual Activity  . Alcohol use: Yes    Alcohol/week: 2.0 standard drinks    Types: 2 Glasses of wine per week  . Drug use: No  . Sexual activity: Not on file  Lifestyle  . Physical activity:    Days per week: Not on file    Minutes per session: Not on file  . Stress: Not on file  Relationships  . Social connections:     Talks on phone: Not on file    Gets together: Not on file    Attends religious service: Not on file    Active member of club or organization: Not on file    Attends meetings of clubs or organizations: Not on file    Relationship status: Not on file  . Intimate partner violence:    Fear of current or ex partner: Not on file    Emotionally abused: Not on file    Physically abused: Not on file    Forced sexual activity: Not on file  Other Topics Concern  . Not on file  Social History Narrative  . Not on file    Current Outpatient Medications on File Prior to Visit  Medication Sig Dispense Refill  . acetaminophen (TYLENOL) 325 MG tablet Take 2 tablets (650 mg total) by mouth every 4 (four) hours as needed for mild pain ((score 1 to 3) or temp > 100.5). 30 tablet 0  . aspirin EC 325 MG EC tablet Take 1 tablet (325 mg total) by mouth daily with breakfast. 30 tablet 0  . cholecalciferol (VITAMIN D) 1000 units tablet Take 1,000 Units by mouth daily.    Marland Kitchen docusate sodium (COLACE) 100 MG capsule Take 1 capsule (100 mg total) by mouth 2 (two) times daily. 10 capsule 0  . gabapentin (NEURONTIN) 300 MG capsule Take 300 mg by mouth 3 (three) times daily.    . magnesium gluconate (MAGONATE) 500 MG tablet Take 500 mg by mouth daily.    . Multiple Vitamins-Minerals (MULTIVITAMIN WITH MINERALS) tablet Take 1 tablet by mouth daily.    Marland Kitchen olmesartan-hydrochlorothiazide (BENICAR HCT) 40-25 MG tablet Take 1 tablet by mouth daily.  5  . ondansetron (ZOFRAN) 4 MG tablet Take 1 tablet (4 mg total) by mouth every 6 (six) hours as needed for nausea. 20 tablet 0  . oxyCODONE (OXY IR/ROXICODONE) 5 MG immediate release tablet Take 1 tablet (5 mg total) by mouth every 3 (three) hours as needed for moderate pain ((score 4 to 6)). 30 tablet 0  . pantoprazole (PROTONIX) 40 MG tablet Take 40 mg by mouth 2 (two) times daily as needed (heartburn).     . senna (SENOKOT) 8.6 MG TABS tablet Take 1 tablet (8.6 mg total) by  mouth 2 (two) times daily. 120 each 0  . Simethicone (GAS-X EXTRA STRENGTH) 125 MG CAPS Take 1 capsule by mouth 3 (three) times daily as needed (gas pains).    . Vitamin D, Ergocalciferol, (DRISDOL) 1.25 MG (50000 UT) CAPS capsule Take by mouth.    Marland Kitchen ZINC SULFATE PO Take by mouth.    . hydrochlorothiazide (HYDRODIURIL) 25 MG tablet Take 1 tablet (25 mg total) by mouth daily for 30 doses. 30 tablet 0   No current facility-administered medications on file prior to visit.     Allergies  Allergen Reactions  . Morphine And Related Itching  . Tramadol Itching  Observations/Objective: There were no vitals filed for this visit. There is no height or weight on file to calculate BMI.  Pain level 0  Physical Exam  Constitutional: She is oriented to person, place, and time. No distress.  HENT:  Head: Normocephalic and atraumatic.  Pulmonary/Chest: Effort normal. No respiratory distress.  Neurological: She is alert and oriented to person, place, and time.  Psychiatric: Affect normal.    CBC    Component Value Date/Time   WBC 6.6 09/06/2018 1122   RBC 3.84 (L) 09/06/2018 1122   HGB 11.2 (L) 09/06/2018 1122   HCT 35.6 (L) 09/06/2018 1122   PLT 321 09/06/2018 1122   MCV 92.7 09/06/2018 1122   MCH 29.2 09/06/2018 1122   MCHC 31.5 09/06/2018 1122   RDW 15.6 (H) 09/06/2018 1122   LYMPHSABS 2.8 09/06/2018 1122   MONOABS 0.5 09/06/2018 1122   EOSABS 0.0 09/06/2018 1122   BASOSABS 0.0 09/06/2018 1122    CMP     Component Value Date/Time   NA 137 06/23/2017 0655   K 4.2 06/23/2017 0655   CL 103 06/23/2017 0655   CO2 26 06/23/2017 0655   GLUCOSE 103 (H) 06/23/2017 0655   BUN 37 (H) 06/23/2017 0655   CREATININE 1.26 (H) 06/23/2017 0655   CALCIUM 8.5 (L) 06/23/2017 0655   GFRNONAA 45 (L) 06/23/2017 0655   GFRAA 52 (L) 06/23/2017 0655     Assessment and Plan: 1. Iron deficiency anemia, unspecified iron deficiency anemia type   2. History of gastric bypass     Labs are  reviewed and discussed with patient. Iron deficiency anemia, responding to IV Venofer treatments. Hemoglobin has improved to 11.2.  Ferritin improved from 3 to 19.  Iron saturation improved from 4 to 22.  Due to COVID-19, I will hold additional IV iron at this point. Recommend patient to try Vitron C 1 tablet daily as maintenance. Refer to Dr. Mechele Collin for follow-up of EGD and colonoscopy. History of gastric bypass, check vitamin B12 and folate at next Follow Up Instructions: Lab MD possible Venofer in 2 months.   I discussed the assessment and treatment plan with the patient. The patient was provided an opportunity to ask questions and all were answered. The patient agreed with the plan and demonstrated an understanding of the instructions.  The patient was advised to call back or seek an in-person evaluation if the symptoms worsen or if the condition fails to improve as anticipated.   I provided 15 minutes of face-to-face video visit time during this encounter, and > 50% was spent counseling as documented under my assessment & plan.  Rickard Patience, MD 09/07/2018 8:13 PM

## 2018-09-08 ENCOUNTER — Other Ambulatory Visit: Payer: Self-pay

## 2018-09-08 ENCOUNTER — Telehealth: Payer: Self-pay

## 2018-09-08 DIAGNOSIS — D509 Iron deficiency anemia, unspecified: Secondary | ICD-10-CM

## 2018-09-08 NOTE — Telephone Encounter (Signed)
Referral to [facility] was faxed to Dr. Earnest Conroy office at [phone].

## 2018-11-02 ENCOUNTER — Inpatient Hospital Stay: Payer: 59 | Attending: Oncology

## 2018-11-02 DIAGNOSIS — Z87891 Personal history of nicotine dependence: Secondary | ICD-10-CM | POA: Insufficient documentation

## 2018-11-02 DIAGNOSIS — E538 Deficiency of other specified B group vitamins: Secondary | ICD-10-CM | POA: Insufficient documentation

## 2018-11-02 DIAGNOSIS — D509 Iron deficiency anemia, unspecified: Secondary | ICD-10-CM | POA: Insufficient documentation

## 2018-11-02 DIAGNOSIS — Z9884 Bariatric surgery status: Secondary | ICD-10-CM | POA: Insufficient documentation

## 2018-11-04 ENCOUNTER — Other Ambulatory Visit: Payer: Self-pay

## 2018-11-04 ENCOUNTER — Inpatient Hospital Stay: Payer: 59

## 2018-11-04 ENCOUNTER — Encounter: Payer: Self-pay | Admitting: Oncology

## 2018-11-04 ENCOUNTER — Inpatient Hospital Stay (HOSPITAL_BASED_OUTPATIENT_CLINIC_OR_DEPARTMENT_OTHER): Payer: 59 | Admitting: Oncology

## 2018-11-04 VITALS — BP 158/97 | HR 60 | Temp 97.2°F | Resp 18 | Wt 187.0 lb

## 2018-11-04 DIAGNOSIS — Z9884 Bariatric surgery status: Secondary | ICD-10-CM

## 2018-11-04 DIAGNOSIS — D509 Iron deficiency anemia, unspecified: Secondary | ICD-10-CM

## 2018-11-04 DIAGNOSIS — E538 Deficiency of other specified B group vitamins: Secondary | ICD-10-CM | POA: Diagnosis not present

## 2018-11-04 DIAGNOSIS — R5382 Chronic fatigue, unspecified: Secondary | ICD-10-CM

## 2018-11-04 DIAGNOSIS — Z87891 Personal history of nicotine dependence: Secondary | ICD-10-CM | POA: Diagnosis not present

## 2018-11-04 LAB — FOLATE: Folate: 5.7 ng/mL — ABNORMAL LOW (ref >5.9)

## 2018-11-04 LAB — IRON AND TIBC
Iron: 47 ug/dL (ref 28–170)
Saturation Ratios: 11 % (ref 10.4–31.8)
TIBC: 420 ug/dL (ref 250–450)
UIBC: 373 ug/dL

## 2018-11-04 LAB — COMPREHENSIVE METABOLIC PANEL
ALT: 16 U/L (ref 0–44)
AST: 21 U/L (ref 15–41)
Albumin: 3.8 g/dL (ref 3.5–5.0)
Alkaline Phosphatase: 124 U/L (ref 38–126)
Anion gap: 11 (ref 5–15)
BUN: 16 mg/dL (ref 8–23)
CO2: 21 mmol/L — ABNORMAL LOW (ref 22–32)
Calcium: 8.5 mg/dL — ABNORMAL LOW (ref 8.9–10.3)
Chloride: 105 mmol/L (ref 98–111)
Creatinine, Ser: 0.88 mg/dL (ref 0.44–1.00)
GFR calc Af Amer: >60 mL/min (ref >60)
GFR calc non Af Amer: >60 mL/min (ref >60)
Glucose, Bld: 84 mg/dL (ref 70–99)
Potassium: 4.2 mmol/L (ref 3.5–5.1)
Sodium: 137 mmol/L (ref 135–145)
Total Bilirubin: 0.4 mg/dL (ref 0.3–1.2)
Total Protein: 7.7 g/dL (ref 6.5–8.1)

## 2018-11-04 LAB — CBC WITH DIFFERENTIAL/PLATELET
Abs Immature Granulocytes: 0.01 10*3/uL (ref 0.00–0.07)
Basophils Absolute: 0.1 10*3/uL (ref 0.0–0.1)
Basophils Relative: 1 %
Eosinophils Absolute: 0.1 10*3/uL (ref 0.0–0.5)
Eosinophils Relative: 1 %
HCT: 34.2 % — ABNORMAL LOW (ref 36.0–46.0)
Hemoglobin: 10.8 g/dL — ABNORMAL LOW (ref 12.0–15.0)
Immature Granulocytes: 0 %
Lymphocytes Relative: 42 %
Lymphs Abs: 2.2 10*3/uL (ref 0.7–4.0)
MCH: 29.7 pg (ref 26.0–34.0)
MCHC: 31.6 g/dL (ref 30.0–36.0)
MCV: 94 fL (ref 80.0–100.0)
Monocytes Absolute: 0.3 10*3/uL (ref 0.1–1.0)
Monocytes Relative: 6 %
Neutro Abs: 2.5 10*3/uL (ref 1.7–7.7)
Neutrophils Relative %: 50 %
Platelets: 293 10*3/uL (ref 150–400)
RBC: 3.64 MIL/uL — ABNORMAL LOW (ref 3.87–5.11)
RDW: 13.2 % (ref 11.5–15.5)
WBC: 5.2 10*3/uL (ref 4.0–10.5)
nRBC: 0 % (ref 0.0–0.2)

## 2018-11-04 LAB — VITAMIN B12: Vitamin B-12: 586 pg/mL (ref 180–914)

## 2018-11-04 LAB — FERRITIN: Ferritin: 8 ng/mL — ABNORMAL LOW (ref 11–307)

## 2018-11-04 NOTE — Progress Notes (Signed)
Patient here for follow up. Pt complains of tingling and numbness to fingers.

## 2018-11-06 MED ORDER — FOLIC ACID 400 MCG PO TABS: 400.0000 ug | ORAL_TABLET | Freq: Every day | ORAL | 3 refills | Status: DC

## 2018-11-06 NOTE — Progress Notes (Signed)
Hematology/Oncology  [facility] [phone]) [phone] [phone]   Patient Care Team: Barbette ReichmannHande, Vishwanath, MD as PCP - General (Internal Medicine)  REFERRING PROVIDER: Barbette ReichmannHande, Vishwanath, MD  CHIEF COMPLAINTS/REASON FOR VISIT:  Evaluation of iron deficiency anemia  HISTORY OF PRESENTING ILLNESS:  Christine RumpfDenise R Reyes is a  63 y.o.  female with PMH listed below who was referred to me for evaluation of iron deficiency anemia Reviewed [facility]'s [facility]  05/19/2018 labs revealed anemia with hemoglobin of 3.9, MCV 77.6.  Normal WBC.  Platelet count elevated at 468.  04/29/2018 ferritin 3, Reviewed patient's previous labs ordered by primary care physician's office,duration is since December 2019. No aggravating or improving factors.  Associated signs and symptoms: Patient reports fatigue, and feeling cold..  Denies SOB with exertion.  Denies weight loss, easy bruising, hematochezia, hemoptysis, hematuria. Context:  History of iron deficiency: Denies Rectal bleeding: Denies Menstrual bleeding/ Vaginal bleeding : Denies Hematemesis or hemoptysis : denies Blood in urine : denies  Pica: Eating ice chips. History of gastric bypass. Fatigue: Yes.  SOB: deneis  INTERVAL HISTORY Christine RumpfDenise R [name] is a 63 y.o. female who has above history reviewed by me today presents for follow up visit for management of iron deficiency anemia, history of gastric bypass. Problems and complaints are listed below: Patient reports feeling tired and fatigued. Fatigue: reports worsening fatigue. Chronic onset, perisistent, no aggravating or improving factors, no associated symptoms.  She complains of tingling and numbness of fingers.  Review of Systems  Constitutional: Positive for fatigue. Negative for appetite change, chills and fever.  HENT:   Negative for hearing loss and voice change.   Eyes: Negative for eye problems.  Respiratory: Negative for  chest tightness and cough.   Cardiovascular: Negative for chest pain.  Gastrointestinal: Negative for abdominal distention, abdominal pain and blood in stool.  Endocrine: Negative for hot flashes.  Genitourinary: Negative for difficulty urinating and frequency.   Musculoskeletal: Negative for arthralgias.  Skin: Negative for itching and rash.  Neurological: Negative for extremity weakness.  Hematological: Negative for adenopathy.  Psychiatric/Behavioral: Negative for confusion.    MEDICAL HISTORY:  Past Medical History:  Diagnosis Date  . Anxiety   . Arthritis   . Headache   . Hypertension   . Iron deficiency anemia 06/17/2018  . Obesity   . Sleep apnea     SURGICAL HISTORY: Past Surgical History:  Procedure Laterality Date  . ABDOMINAL HYSTERECTOMY    . BREAST EXCISIONAL BIOPSY Right 1989  . COLONOSCOPY WITH ESOPHAGOGASTRODUODENOSCOPY (EGD)    . csection x2    . DILATION AND CURETTAGE OF UTERUS    . ESOPHAGOGASTRODUODENOSCOPY (EGD) WITH PROPOFOL N/A 12/14/2015   Procedure: ESOPHAGOGASTRODUODENOSCOPY (EGD) WITH PROPOFOL;  Surgeon: Scot Junobert T Elliott, MD;  [facility];  Service: Endoscopy;  Laterality: N/A;  . GASTRIC BYPASS    . JOINT REPLACEMENT    . left knee surgery    . TOTAL KNEE ARTHROPLASTY Right 06/22/2017   Procedure: TOTAL KNEE ARTHROPLASTY;  Surgeon: Lyndle HerrlichBowers, James R, MD;  Location: [facility] ORS;  Service: Orthopedics;  Laterality: Right;    SOCIAL HISTORY: Social History   Socioeconomic History  . Marital status: Married    Spouse name: Not on file  . Number of children: Not on file  . Years of education: Not on file  . Highest education level: Not on file  Occupational History  . Not on file  Social Needs  . Financial resource strain: Not  on file  . Food insecurity    Worry: Not on file    Inability: Not on file  . Transportation needs    Medical: Not on file    Non-medical: Not on file  Tobacco Use  . Smoking status: Former Smoker     Packs/day: 0.50    Quit date: 06/11/2015    Years since quitting: 3.4  . Smokeless tobacco: Former Network engineer and Sexual Activity  . Alcohol use: Yes    Alcohol/week: 2.0 standard drinks    Types: 2 Glasses of wine per week  . Drug use: No  . Sexual activity: Not on file  Lifestyle  . Physical activity    Days per week: Not on file    Minutes per session: Not on file  . Stress: Not on file  Relationships  . Social Herbalist on phone: Not on file    Gets together: Not on file    Attends religious service: Not on file    Active member of club or organization: Not on file    Attends meetings of clubs or organizations: Not on file    Relationship status: Not on file  . Intimate partner violence    Fear of current or ex partner: Not on file    Emotionally abused: Not on file    Physically abused: Not on file    Forced sexual activity: Not on file  Other Topics Concern  . Not on file  Social History Narrative  . Not on file    FAMILY HISTORY: Family History  Problem Relation Age of Onset  . Breast cancer Neg Hx     ALLERGIES:  is allergic to morphine and related and tramadol.  MEDICATIONS:  Current Outpatient Medications  Medication Sig Dispense Refill  . acetaminophen (TYLENOL) 325 MG tablet Take 2 tablets (650 mg total) by mouth every 4 (four) hours as needed for mild pain ((score 1 to 3) or temp > 100.5). 30 tablet 0  . aspirin EC 325 MG EC tablet Take 1 tablet (325 mg total) by mouth daily with breakfast. 30 tablet 0  . cholecalciferol (VITAMIN D) 1000 units tablet Take 1,000 Units by mouth daily.    Marland Kitchen docusate sodium (COLACE) 100 MG capsule Take 1 capsule (100 mg total) by mouth 2 (two) times daily. 10 capsule 0  . gabapentin (NEURONTIN) 300 MG capsule Take 300 mg by mouth daily.     . Iron-Vitamin C 65-125 MG TABS Take 1 tablet by mouth daily. 90 tablet 0  . magnesium gluconate (MAGONATE) 500 MG tablet Take 500 mg by mouth daily.    . Multiple  Vitamins-Minerals (MULTIVITAMIN WITH MINERALS) tablet Take 1 tablet by mouth daily.    Marland Kitchen olmesartan-hydrochlorothiazide (BENICAR HCT) 40-25 MG tablet Take 1 tablet by mouth daily.  5  . ondansetron (ZOFRAN) 4 MG tablet Take 1 tablet (4 mg total) by mouth every 6 (six) hours as needed for nausea. 20 tablet 0  . oxyCODONE (OXY IR/ROXICODONE) 5 MG immediate release tablet Take 1 tablet (5 mg total) by mouth every 3 (three) hours as needed for moderate pain ((score 4 to 6)). 30 tablet 0  . pantoprazole (PROTONIX) 40 MG tablet Take 40 mg by mouth 2 (two) times daily as needed (heartburn).     . senna (SENOKOT) 8.6 MG TABS tablet Take 1 tablet (8.6 mg total) by mouth 2 (two) times daily. 120 each 0  . Simethicone (GAS-X EXTRA STRENGTH) 125 MG CAPS  Take 1 capsule by mouth 3 (three) times daily as needed (gas pains).    . Vitamin D, Ergocalciferol, (DRISDOL) 1.25 MG (50000 UT) CAPS capsule Take 50,000 Units by mouth every 7 (seven) days.     Marland Kitchen. ZINC SULFATE PO Take by mouth.    . hydrochlorothiazide (HYDRODIURIL) 25 MG tablet Take 1 tablet (25 mg total) by mouth daily for 30 doses. 30 tablet 0   No current facility-administered medications for this visit.      PHYSICAL EXAMINATION: ECOG PERFORMANCE STATUS: 1 - Symptomatic but completely ambulatory Vitals:   11/04/18 1028  BP: (!) 158/97  Pulse: 60  Resp: 18  Temp: (!) 97.2 F (36.2 C)   Filed Weights   11/04/18 1028  Weight: 187 lb (84.8 kg)    Physical Exam Constitutional:      General: She is not in acute distress. HENT:     Head: Normocephalic and atraumatic.  Eyes:     General: No scleral icterus.    Pupils: Pupils are equal, round, and reactive to light.  Neck:     Musculoskeletal: Normal range of motion and neck supple.  Cardiovascular:     Rate and Rhythm: Normal rate and regular rhythm.     Heart sounds: Normal heart sounds.  Pulmonary:     Effort: Pulmonary effort is normal. No respiratory distress.     Breath sounds: No  wheezing.  Abdominal:     General: Bowel sounds are normal. There is no distension.     Palpations: Abdomen is soft. There is no mass.     Tenderness: There is no abdominal tenderness.  Musculoskeletal: Normal range of motion.        General: No deformity.  Skin:    General: Skin is warm and dry.     Findings: No erythema or rash.  Neurological:     Mental Status: She is alert and oriented to person, place, and time.     Cranial Nerves: No cranial nerve deficit.     Coordination: Coordination normal.  Psychiatric:        Behavior: Behavior normal.        Thought Content: Thought content normal.       CMP Latest Ref Rng & Units 11/04/2018  Glucose 70 - 99 mg/dL 84  BUN 8 - 23 mg/dL 16  Creatinine 6.040.44 - 5.401.00 mg/dL 9.810.88  Sodium 191135 - 478145 mmol/L 137  Potassium 3.5 - 5.1 mmol/L 4.2  Chloride 98 - 111 mmol/L 105  CO2 22 - 32 mmol/L 21(L)  Calcium 8.9 - 10.3 mg/dL 2.9(F8.5(L)  Total Protein 6.5 - 8.1 g/dL 7.7  Total Bilirubin 0.3 - 1.2 mg/dL 0.4  Alkaline Phos 38 - 126 U/L 124  AST 15 - 41 U/L 21  ALT 0 - 44 U/L 16   CBC Latest Ref Rng & Units 11/04/2018  WBC 4.0 - 10.5 K/uL 5.2  Hemoglobin 12.0 - 15.0 g/dL 10.8(L)  Hematocrit 36.0 - 46.0 % 34.2(L)  Platelets 150 - 400 K/uL 293     LABORATORY DATA:  I have reviewed the data as listed Lab Results  Component Value Date   WBC 5.2 11/04/2018   HGB 10.8 (L) 11/04/2018   HCT 34.2 (L) 11/04/2018   MCV 94.0 11/04/2018   PLT 293 11/04/2018   Recent Labs    11/04/18 0933  NA 137  K 4.2  CL 105  CO2 21*  GLUCOSE 84  BUN 16  CREATININE 0.88  CALCIUM 8.5*  GFRNONAA >60  GFRAA >60  PROT 7.7  ALBUMIN 3.8  AST 21  ALT 16  ALKPHOS 124  BILITOT 0.4   Iron/TIBC/Ferritin/ %Sat    Component Value Date/Time   IRON 47 11/04/2018 0933   TIBC 420 11/04/2018 0933   FERRITIN 8 (L) 11/04/2018 0933   IRONPCTSAT 11 11/04/2018 0933     No results found.    ASSESSMENT & PLAN:  1. Iron deficiency anemia, unspecified iron  deficiency anemia type   2. History of gastric bypass   3. Folic acid deficiency    # Labs reviewed and discussed with patient.   Hemoglobin and iron store have both decreased.  Consistent with worsening of iron deficiency anemia.  Likely malabsorption due to gastric bypass, vs chronic blood loss. Patient was referred to Dr. Mechele CollinElliott for follow-up for EGD and a colonoscopy.  Recommend IV Venofer 200 mg weekly x4. History of gastric bypass,vitamin B12 level normal.   Folic acid deficiency, recommend patient to start folic acid supplementation.   Orders Placed This Encounter  Procedures  . CBC with Differential/Platelet    Standing Status:   Future    Standing Expiration Date:   11/04/2019  . Iron and TIBC    Standing Status:   Future    Standing Expiration Date:   11/04/2019  . Ferritin    Standing Status:   Future    Standing Expiration Date:   11/04/2019    All questions were answered. The patient knows to call the clinic with any problems questions or concerns.  Return of visit: 12 weeks.     Rickard PatienceZhou Shaley Leavens, MD, PhD  11/06/2018

## 2018-11-09 ENCOUNTER — Telehealth: Payer: Self-pay

## 2018-11-09 NOTE — Telephone Encounter (Signed)
Called patient on 6/30 to notify her that iron level is low and Dr. Tasia Catchings recommends that she gets IV venofer weekly x4. When I spoke to patient she said that she would not do weekly because every time she gets and iron infusion her hair falls off. She is willing to do monthly. Dr. Tasia Catchings notified and agreed to do venofer monthly x2.

## 2018-11-10 ENCOUNTER — Other Ambulatory Visit: Payer: Self-pay | Admitting: Oncology

## 2018-11-17 ENCOUNTER — Inpatient Hospital Stay: Payer: 59

## 2018-12-01 ENCOUNTER — Inpatient Hospital Stay: Payer: 59 | Attending: Oncology

## 2018-12-17 ENCOUNTER — Telehealth: Payer: Self-pay | Admitting: *Deleted

## 2018-12-17 NOTE — Telephone Encounter (Signed)
-----   Message from Clancy Gourd sent at 12/17/2018 12:09 PM EDT ----- Regarding: Request to Reschedule Appt Contact: [phone] Hello,  I called this pt to do a pre-appt screen but she states that she will not be able to make infusion appt scheduled on 12/20/18. She is requesting a callback at [phone] to reschedule. Thanks.

## 2018-12-17 NOTE — Telephone Encounter (Signed)
Got a messae from tiffany about Dr. Tasia Catchings pt. That will not be able to come to appt on 8/10. I called pt. To let her know that I will cancel appt. I got her voicemail so I left the message that I have cancelled the 8/10 appt for her per her request but she can call back to main number and hit promptt for scheduling and it get it r/s that would be great.

## 2018-12-20 ENCOUNTER — Inpatient Hospital Stay: Payer: 59

## 2018-12-27 ENCOUNTER — Inpatient Hospital Stay: Payer: 59 | Attending: Oncology

## 2018-12-27 ENCOUNTER — Ambulatory Visit: Payer: 59

## 2019-01-10 ENCOUNTER — Other Ambulatory Visit: Payer: Self-pay | Admitting: *Deleted

## 2019-01-10 MED ORDER — VITRON-C 65-125 MG PO TABS: 1.0000 | ORAL_TABLET | Freq: Every day | ORAL | 2 refills | Status: DC

## 2019-01-18 ENCOUNTER — Other Ambulatory Visit: Payer: Self-pay

## 2019-01-18 ENCOUNTER — Ambulatory Visit
Payer: 59 | Attending: Student in an Organized Health Care Education/Training Program | Admitting: Student in an Organized Health Care Education/Training Program

## 2019-01-18 ENCOUNTER — Encounter: Payer: Self-pay | Admitting: Student in an Organized Health Care Education/Training Program

## 2019-01-18 VITALS — BP 161/89 | HR 78 | Temp 99.1°F | Resp 16 | Ht 59.0 in | Wt 183.0 lb

## 2019-01-18 DIAGNOSIS — I159 Secondary hypertension, unspecified: Secondary | ICD-10-CM

## 2019-01-18 DIAGNOSIS — M1712 Unilateral primary osteoarthritis, left knee: Secondary | ICD-10-CM | POA: Insufficient documentation

## 2019-01-18 DIAGNOSIS — M48062 Spinal stenosis, lumbar region with neurogenic claudication: Secondary | ICD-10-CM | POA: Insufficient documentation

## 2019-01-18 DIAGNOSIS — Z9989 Dependence on other enabling machines and devices: Secondary | ICD-10-CM

## 2019-01-18 DIAGNOSIS — G8929 Other chronic pain: Secondary | ICD-10-CM | POA: Insufficient documentation

## 2019-01-18 DIAGNOSIS — M542 Cervicalgia: Secondary | ICD-10-CM | POA: Diagnosis present

## 2019-01-18 DIAGNOSIS — M5136 Other intervertebral disc degeneration, lumbar region: Secondary | ICD-10-CM | POA: Diagnosis not present

## 2019-01-18 DIAGNOSIS — Z96651 Presence of right artificial knee joint: Secondary | ICD-10-CM | POA: Diagnosis present

## 2019-01-18 DIAGNOSIS — Z9884 Bariatric surgery status: Secondary | ICD-10-CM | POA: Diagnosis present

## 2019-01-18 DIAGNOSIS — M25511 Pain in right shoulder: Secondary | ICD-10-CM | POA: Diagnosis present

## 2019-01-18 DIAGNOSIS — G894 Chronic pain syndrome: Secondary | ICD-10-CM | POA: Diagnosis present

## 2019-01-18 DIAGNOSIS — G4733 Obstructive sleep apnea (adult) (pediatric): Secondary | ICD-10-CM | POA: Diagnosis present

## 2019-01-18 DIAGNOSIS — M170 Bilateral primary osteoarthritis of knee: Secondary | ICD-10-CM

## 2019-01-18 DIAGNOSIS — K219 Gastro-esophageal reflux disease without esophagitis: Secondary | ICD-10-CM | POA: Insufficient documentation

## 2019-01-18 DIAGNOSIS — I5031 Acute diastolic (congestive) heart failure: Secondary | ICD-10-CM | POA: Insufficient documentation

## 2019-01-18 DIAGNOSIS — M25512 Pain in left shoulder: Secondary | ICD-10-CM

## 2019-01-18 DIAGNOSIS — M546 Pain in thoracic spine: Secondary | ICD-10-CM | POA: Diagnosis present

## 2019-01-18 DIAGNOSIS — M47816 Spondylosis without myelopathy or radiculopathy, lumbar region: Secondary | ICD-10-CM | POA: Diagnosis not present

## 2019-01-18 MED ORDER — GABAPENTIN 300 MG PO CAPS: 300.0000 mg | ORAL_CAPSULE | Freq: Two times a day (BID) | ORAL | 2 refills | Status: DC

## 2019-01-18 NOTE — Progress Notes (Signed)
Safety precautions to be maintained throughout the outpatient stay will include: orient to surroundings, keep bed in low position, maintain call bell within reach at all times, provide assistance with transfer out of bed and ambulation.  

## 2019-01-18 NOTE — Patient Instructions (Signed)
____________________________________________________________________________________________  Preparing for Procedure with Sedation  Procedure appointments are limited to planned procedures: . No Prescription Refills. . No disability issues will be discussed. . No medication changes will be discussed.  Instructions: . Oral Intake: Do not eat or drink anything for at least 8 hours prior to your procedure. . Transportation: Public transportation is not allowed. Bring an adult driver. The driver must be physically present in our waiting room before any procedure can be started. . Physical Assistance: Bring an adult physically capable of assisting you, in the event you need help. This adult should keep you company at home for at least 6 hours after the procedure. . Blood Pressure Medicine: Take your blood pressure medicine with a sip of water the morning of the procedure. . Blood thinners: Notify our staff if you are taking any blood thinners. Depending on which one you take, there will be specific instructions on how and when to stop it. . Diabetics on insulin: Notify the staff so that you can be scheduled 1st case in the morning. If your diabetes requires high dose insulin, take only  of your normal insulin dose the morning of the procedure and notify the staff that you have done so. . Preventing infections: Shower with an antibacterial soap the morning of your procedure. . Build-up your immune system: Take 1000 mg of Vitamin C with every meal (3 times a day) the day prior to your procedure. . Antibiotics: Inform the staff if you have a condition or reason that requires you to take antibiotics before dental procedures. . Pregnancy: If you are pregnant, call and cancel the procedure. . Sickness: If you have a cold, fever, or any active infections, call and cancel the procedure. . Arrival: You must be in the facility at least 30 minutes prior to your scheduled procedure. . Children: Do not bring  children with you. . Dress appropriately: Bring dark clothing that you would not mind if they get stained. . Valuables: Do not bring any jewelry or valuables.  Reasons to call and reschedule or cancel your procedure: (Following these recommendations will minimize the risk of a serious complication.) . Surgeries: Avoid having procedures within 2 weeks of any surgery. (Avoid for 2 weeks before or after any surgery). . Flu Shots: Avoid having procedures within 2 weeks of a flu shots or . (Avoid for 2 weeks before or after immunizations). . Barium: Avoid having a procedure within 7-10 days after having had a radiological study involving the use of radiological contrast. (Myelograms, Barium swallow or enema study). . Heart attacks: Avoid any elective procedures or surgeries for the initial 6 months after a "Myocardial Infarction" (Heart Attack). . Blood thinners: It is imperative that you stop these medications before procedures. Let us know if you if you take any blood thinner.  . Infection: Avoid procedures during or within two weeks of an infection (including chest colds or gastrointestinal problems). Symptoms associated with infections include: Localized redness, fever, chills, night sweats or profuse sweating, burning sensation when voiding, cough, congestion, stuffiness, runny nose, sore throat, diarrhea, nausea, vomiting, cold or Flu symptoms, recent or current infections. It is specially important if the infection is over the area that we intend to treat. . Heart and lung problems: Symptoms that may suggest an active cardiopulmonary problem include: cough, chest pain, breathing difficulties or shortness of breath, dizziness, ankle swelling, uncontrolled high or unusually low blood pressure, and/or palpitations. If you are experiencing any of these symptoms, cancel your procedure and contact   your primary care physician for an evaluation.  Remember:  Regular Business hours are:  Monday to Thursday  8:00 AM to 4:00 PM  Provider's Schedule: Delano MetzFrancisco Naveira, MD:  Procedure days: Tuesday and Thursday 7:30 AM to 4:00 PM  Edward JollyBilal Lateef, MD:  Procedure days: Monday and Wednesday 7:30 AM to 4:00 PM ____________________________________________________________________________________________   Knee Injection A knee injection is a procedure to get medicine into your knee joint to relieve the pain, swelling, and stiffness of arthritis. Your health care provider uses a needle to inject medicine, which may also help to lubricate and cushion your knee joint. You may need more than one injection. Tell a health care provider about:  Any allergies you have.  All medicines you are taking, including vitamins, herbs, eye drops, creams, and over-the-counter medicines.  Any problems you or family members have had with anesthetic medicines.  Any blood disorders you have.  Any surgeries you have had.  Any medical conditions you have.  Whether you are pregnant or may be pregnant. What are the risks? Generally, this is a safe procedure. However, problems may occur, including:  Infection.  Bleeding.  Symptoms that get worse.  Damage to the area around your knee.  Allergic reaction to any of the medicines.  Skin reactions from repeated injections. What happens before the procedure?  Ask your health care provider about changing or stopping your regular medicines. This is especially important if you are taking diabetes medicines or blood thinners.  Plan to have someone take you home from the hospital or clinic. What happens during the procedure?   You will sit or lie down in a position for your knee to be treated.  The skin over your kneecap will be cleaned with a germ-killing soap.  You will be given a medicine that numbs the area (local anesthetic). You may feel some stinging.  The medicine will be injected into your knee. The needle is carefully placed between your kneecap and your  knee. The medicine is injected into the joint space.  The needle will be removed at the end of the procedure.  A bandage (dressing) may be placed over the injection site. The procedure may vary among health care providers and hospitals. What can I expect after the procedure?  Your blood pressure, heart rate, breathing rate, and blood oxygen level will be monitored until you leave the hospital or clinic.  You may have to move your knee through its full range of motion. This helps to get all the medicine into your joint space.  You will be watched to make sure that you do not have a reaction to the injected medicine.  You may feel more pain, swelling, and warmth than you did before the injection. This reaction may last about 1-2 days. Follow these instructions at home: Medicines  Take over-the-counter and prescription medicines only as told by your doctor.  Do not drive or use heavy machinery while taking prescription pain medicine.  Do not take medicines such as aspirin and ibuprofen unless your health care provider tells you to take them. Injection site care  Follow instructions from your health care provider about: ? How to take care of your puncture site. ? When and how you should change your dressing. ? When you should remove your dressing.  Check your injection area every day for signs of infection. Check for: ? More redness, swelling, or pain after 2 days. ? Fluid or blood. ? Pus or a bad smell. ? Warmth. Managing pain,  stiffness, and swelling   If directed, put ice on the injection area: ? Put ice in a plastic bag. ? Place a towel between your skin and the bag. ? Leave the ice on for 20 minutes, 2-3 times per day.  Do not apply heat to your knee.  Raise (elevate) the injection area above the level of your heart while you are sitting or lying down. General instructions  If you were given a dressing, keep it dry until your health care provider says it can be  removed. Ask your health care provider when you can start showering or taking a bath.  Avoid strenuous activities for as long as directed by your health care provider. Ask your health care provider when you can return to your normal activities.  Keep all follow-up visits as told by your health care provider. This is important. You may need more injections. Contact a health care provider if you have:  A fever.  Warmth in your injection area.  Fluid, blood, or pus coming from your injection site.  Symptoms at your injection site that last longer than 2 days after your procedure. Get help right away if:  Your knee: ? Turns very red. ? Becomes very swollen. ? Is in severe pain. Summary  A knee injection is a procedure to get medicine into your knee joint to relieve the pain, swelling, and stiffness of arthritis.  A needle is carefully placed between your kneecap and your knee to inject medicine into the joint space.  Before the procedure, ask your health care provider about changing or stopping your regular medicines, especially if you are taking diabetes medicines or blood thinners.  Contact your health care provider if you have any problems or questions after your procedure. This information is not intended to replace advice given to you by your health care provider. Make sure you discuss any questions you have with your health care provider. Document Released: 07/20/2006 Document Revised: 05/18/2017 Document Reviewed: 05/18/2017 [employer] Patient Education  2020 [employer] Lumbar Sympathetic Block Patient Information  Description: The lumbar plexus is a group of nerves that are part of the sympathetic nervous system.  These nerves supply organs in the pelvis and legs.  Lumbar sympathetic blocks are utilized for the diagnosis and treatment of painful conditions in these areas.   The lumbar plexus is located on both sides of the aorta at approximately the level of the second  lumbar vertebral body.  The block will be performed with you lying on your abdomen with a pillow underneath.  Using direct x-ray guidance,   The plexus will be located on both sides of the spine.  Numbing medicine will be used to deaden the skin prior to needle insertion.  In most cases, a small amount of sedation can be give by IV prior to the numbing medicine.  One or two small needles will be placed near the plexus and local anesthetic will be injected.  This may make your leg(s) feel warm.  The Entire block usually lasts about 15-25 minutes.  Conditions which may be treated by lumbar sympathetic block:   Reflex sympathetic dystrophy  Phantom limb pain  Peripheral neuropathy  Peripheral vascular disease ( inadequate blood flow )  Cancer pain of pelvis, leg and kidney  Preparation for the injection:  1. Do note eat any solid food or diary products within 8 hours of your appointment. 2. You may drink clear liquids up to 3 hours before appointment.  Clear liquids include water,  black coffee, juice or soda.  No milk or cream please. 3. You may take your regular medication, including pain medications, with a sip of water before you appointment.  Diabetics should hold regular insulin ( if taken separately ) and take 1/2 NPH dose the morning of the procedure .  Carry some sugar containing items with you to your appointment. 4. A driver must accompany you and be prepared to drive you home after your procedure. 5. Bring all your current medication with you. 6. An IV may be inserted and sedation may be given at the discretion of the physician.  7. A blood pressure cuff, EKG and other monitors will often be applied during the procedure.  Some patients may need to have extra oxygen administered for a short period. 8. You will be asked to provide medical information, including your allergies and medications, prior to the procedure.  We must know immediately if your taking blood thinners (like  Coumadin/Warfarin) or if you are allergic to IV iodine contrast (dye).  We must know if you could possibly be pregnant.  Possible side-effects   Bleeding from needle site or deeper  Infection (rare, can require surgery)  Nerve injury (rare)  Numbness & tingling (temporary)  Collapsed lung (rare)  Spinal headache (a headache worse with upright posture)  Light-headedness (temporary)  Pain at injection site (several days)  Decreased blood pressure (temporary)  Weakness in legs (temporary)  Seizure or other drug reaction (rare)  Call if you experience:   Fever/chills associated with headache or increased back/ neck pain  Headache worsened by an upright position  New onset weakness or numbness of an extremity below the injection site  Hives or difficulty breathing ( go to the emergency room)  Inflammation or drainage at the injections site(s)  New symptoms which are concerning to you  Please note:  If effective, we will often do a series of 2-3 injections spaced 3-6 weeks apart to maximally decrease your pain.  If initial series is effective, you may be a candidate for a more permanent block of the lumbar sympathetic plexus.  If you have any questions please call [phone] [facility]

## 2019-01-18 NOTE — Progress Notes (Signed)
Patient's Name: Christine Reyes  MRN: [id number]  Referring Provider: Tracie Harrier, MD  DOB: [birth year]  PCP: Tracie Harrier, MD  DOS: 01/18/2019  Note by: Gillis Santa, MD  Service setting: Ambulatory outpatient  Specialty: Interventional Pain Management  Location: [facility] (AMB) Pain Management Facility  Visit type: Initial Patient Evaluation  Patient type: New Patient   Primary Reason(s) for Visit: Encounter for initial evaluation of one or more chronic problems (new to examiner) potentially causing chronic pain, and posing a threat to normal musculoskeletal function. (Level of risk: High) CC: Shoulder Pain (right ), Back Pain (lumbar that goes up ), and Knee Pain (bilateral, s/p joint replacement right knee)  HPI  Ms. Laitinen is a 63 y.o. year old, female patient, who comes today to see [place] for the first time for an initial evaluation of her chronic pain. She has History of total right knee replacement; Iron deficiency anemia; Spinal stenosis, lumbar region, with neurogenic claudication; Lumbar facet arthropathy; Lumbar spondylosis; Lumbar degenerative disc disease; Primary osteoarthritis of left knee; and Chronic pain syndrome on their problem list. Today she comes in for evaluation of her Shoulder Pain (right ), Back Pain (lumbar that goes up ), and Knee Pain (bilateral, s/p joint replacement right knee)  Pain Assessment: Location: Lower, Left, Right Back(see visit info for additional sites.) Radiating: patient also c/o numbness and tingling in fingers worse on the right Onset: More than a month ago Duration: Chronic pain Quality: Numbness, Tingling Severity: 7 /10 (subjective, self-reported pain score)  Note: Reported level is compatible with observation.  Effect on ADL: sleep disruption, uses a cane and walker for ambulation.  presents in wheelchair today. Timing: Constant Modifying factors: medications, rest. BP: (!) 161/89  HR: 78  Onset and Duration: Date of onset: July  2016 Cause of pain: Unknown Severity: No change since onset, NAS-11 at its worse: 8/10, NAS-11 at its best: 6/10, NAS-11 now: 6/10 and NAS-11 on the average: 6/10 Timing: Afternoon and During activity or exercise Aggravating Factors: Climbing, Kneeling, Prolonged sitting, Prolonged standing and Walking Alleviating Factors: Medications and Resting Associated Problems: Spasms and Pain that wakes patient up Quality of Pain: Intermittent, Cramping, Nagging, Pressure-like and Uncomfortable Previous Examinations or Tests: Nerve block Previous Treatments: Narcotic medications  The patient comes into the clinics today for the first time for a chronic pain management evaluation.   Patient is a pleasant 63 year old female with multiple pain complaints.  Her most painful region is her lower lumbar spine.  Patient does have a history of diffuse lumbar degenerative disc disease, severe lumbar spinal stenosis most pronounced at L4-5 and L5-S1 along with severe facet arthropathy lumbar spondylosis at L4-L5, L5-S1.  Patient also has a history of bilateral hip pain related to hip osteoarthritis.  She is status post right total knee arthroplasty.  She also had prior left knee surgery which appears to be arthroscopic surgery without any placement of hardware.  Patient is status post gastric bypass surgery.  She has been instructed to refrain from NSAIDs.  She is currently on oxycodone 20 mg twice daily.  She is also on gabapentin 300 mg once a day along with magnesium 500 mg daily.  She has tried aquatic therapy in the past.  She states that this was helpful but she has been having difficulty getting back to the [facility] given current COVID situation.  This has negatively impacted her functional status and her chronic pain.  Today I took the time to provide the patient with information regarding my  pain practice. The patient was informed that my practice is divided into two sections: an interventional pain management  section, as well as a completely separate and distinct medication management section. I explained that I have procedure days for my interventional therapies, and evaluation days for follow-ups and medication management. Because of the amount of documentation required during both, they are kept separated. This means that there is the possibility that she may be scheduled for a procedure on one day, and medication management the next. I have also informed her that because of staffing and facility limitations, I no longer take patients for medication management only. To illustrate the reasons for this, I gave the patient the example of surgeons, and how inappropriate it would be to refer a patient to his/her care, just to write for the post-surgical antibiotics on a surgery done by a different surgeon.   Because interventional pain management is my board-certified specialty, the patient was informed that joining my practice means that they are open to any and all interventional therapies. I made it clear that this does not mean that they will be forced to have any procedures done. What this means is that I believe interventional therapies to be essential part of the diagnosis and proper management of chronic pain conditions. Therefore, patients not interested in these interventional alternatives will be better served under the care of a different practitioner.  The patient was also made aware of my Comprehensive Pain Management Safety Guidelines where by joining my practice, they limit all of their nerve blocks and joint injections to those done by our practice, for as long as we are retained to manage their care.   Historic Controlled Substance Pharmacotherapy Review   12/28/2018  1   12/28/2018  Oxycodone Hcl 20 MG Tablet  60.00 30 Vi [place]   [id number]   [place] ([id number])   0  60.00 MME  Medicare   [place]    Medications: The patient did not bring the medication(s) to the appointment, as requested in our "New Patient  Package" Pharmacodynamics: Desired effects: Analgesia: The patient reports >50% benefit. Reported improvement in function: The patient reports medication allows her to accomplish basic ADLs. Clinically meaningful improvement in function (CMIF): Sustained CMIF goals met Perceived effectiveness: Described as relatively effective, allowing for increase in activities of daily living (ADL) Undesirable effects: Side-effects or Adverse reactions: None reported Historical Monitoring: The patient  reports no history of drug use. List of all UDS Test(s): No results found for: MDMA, COCAINSCRNUR, [place], [place], CANNABQUANT, THCU, [place] List of other Serum/Urine Drug Screening Test(s):  No results found for: AMPHSCRSER, BARBSCRSER, BENZOSCRSER, COCAINSCRSER, COCAINSCRNUR, PCPSCRSER, PCPQUANT, THCSCRSER, THCU, CANNABQUANT, OPIATESCRSER, OXYSCRSER, PROPOXSCRSER, ETH Historical Background Evaluation: [place] PMP: PDMP reviewed during this encounter. Six (6) year initial data search conducted.              Department of public safety, offender search: Editor, commissioning Information) Non-contributory Risk Assessment Profile: Aberrant behavior: None observed or detected today Risk factors for fatal opioid overdose: None identified today Fatal overdose hazard ratio (HR): Calculation deferred Non-fatal overdose hazard ratio (HR): Calculation deferred Risk of opioid abuse or dependence: 0.7-3.0% with doses ? 36 MME/day and 6.1-26% with doses ? 120 MME/day. Substance use disorder (SUD) risk level: See below Personal History of Substance Abuse (SUD-Substance use disorder):  Alcohol: Negative  Illegal Drugs: Negative  Rx Drugs: Negative  ORT Risk Level calculation: Low Risk Opioid Risk Tool - 01/18/19 0915      Family History of Substance  Abuse   Alcohol  Negative    Illegal Drugs  Negative    Rx Drugs  Negative      Personal History of Substance Abuse   Alcohol  Negative    Illegal Drugs  Negative    Rx Drugs   Negative      Age   Age between 22-45 years   No      Psychological Disease   Psychological Disease  Negative    Depression  Negative      Total Score   Opioid Risk Tool Scoring  0    Opioid Risk Interpretation  Low Risk      ORT Scoring interpretation table:  Score <3 = Low Risk for SUD  Score between 4-7 = Moderate Risk for SUD  Score >8 = High Risk for Opioid Abuse   PHQ-2 Depression Scale:  Total score:    PHQ-2 Scoring interpretation table: (Score and probability of major depressive disorder)  Score 0 = No depression  Score 1 = 15.4% Probability  Score 2 = 21.1% Probability  Score 3 = 38.4% Probability  Score 4 = 45.5% Probability  Score 5 = 56.4% Probability  Score 6 = 78.6% Probability   PHQ-9 Depression Scale:  Total score:    PHQ-9 Scoring interpretation table:  Score 0-4 = No depression  Score 5-9 = Mild depression  Score 10-14 = Moderate depression  Score 15-19 = Moderately severe depression  Score 20-27 = Severe depression (2.4 times higher risk of SUD and 2.89 times higher risk of overuse)   Pharmacologic Plan: As per protocol, I have not taken over any controlled substance management, pending the results of ordered tests and/or consults.            Initial impression: Pending review of available data and ordered tests.  Meds   Current Outpatient Medications:  .  folic acid (V-R FOLIC ACID) 161 MCG tablet, Take 1 tablet (400 mcg total) by mouth daily., Disp: 90 tablet, Rfl: 3 .  gabapentin (NEURONTIN) 300 MG capsule, Take 1 capsule (300 mg total) by mouth 2 (two) times daily., Disp: 60 capsule, Rfl: 2 .  Iron-Vitamin C (VITRON-C) 65-125 MG TABS, Take 1 tablet by mouth daily., Disp: 60 tablet, Rfl: 2 .  magnesium gluconate (MAGONATE) 500 MG tablet, Take 500 mg by mouth daily., Disp: , Rfl:  .  Multiple Vitamins-Minerals (MULTIVITAMIN WITH MINERALS) tablet, Take 1 tablet by mouth daily., Disp: , Rfl:  .  naloxone (NARCAN) 4 MG/0.1ML LIQD nasal spray kit,  Inhale 4 mg into the lungs as needed., Disp: , Rfl:  .  olmesartan-hydrochlorothiazide (BENICAR HCT) 40-25 MG tablet, Take 1 tablet by mouth daily., Disp: , Rfl: 5 .  Oxycodone HCl 20 MG TABS, Take 20 mg by mouth 2 (two) times daily as needed., Disp: , Rfl:  .  pantoprazole (PROTONIX) 40 MG tablet, Take 40 mg by mouth 2 (two) times daily as needed (heartburn). , Disp: , Rfl:  .  senna (SENOKOT) 8.6 MG TABS tablet, Take 1 tablet (8.6 mg total) by mouth 2 (two) times daily., Disp: 120 each, Rfl: 0 .  Simethicone (GAS-X EXTRA STRENGTH) 125 MG CAPS, Take 1 capsule by mouth 3 (three) times daily as needed (gas pains)., Disp: , Rfl:  .  cholecalciferol (VITAMIN D) 1000 units tablet, Take 1,000 Units by mouth daily., Disp: , Rfl:  .  hydrochlorothiazide (HYDRODIURIL) 25 MG tablet, Take 1 tablet (25 mg total) by mouth daily for 30 doses., Disp: 30 tablet,  Rfl: 0 .  Vitamin D, Ergocalciferol, (DRISDOL) 1.25 MG (50000 UT) CAPS capsule, Take 50,000 Units by mouth every 7 (seven) days. , Disp: , Rfl:  .  ZINC SULFATE PO, Take by mouth., Disp: , Rfl:   Imaging Review   Lumbosacral Imaging: Lumbar MR wo contrast:  Results for orders placed during the hospital encounter of 06/24/16  MR LUMBAR SPINE WO CONTRAST   Narrative CLINICAL DATA:  63 year old female with lumbar back pain radiating down both lower extremities to the feet with bilateral foot numbness. Bilateral leg weakness. Symptoms for 3-4 months. Initial encounter.  EXAM: MRI LUMBAR SPINE WITHOUT CONTRAST  TECHNIQUE: Multiplanar, multisequence MR imaging of the lumbar spine was performed. No intravenous contrast was administered.  COMPARISON:  Lumbar MRI 10/13/2013.  FINDINGS: Segmentation: Designated to be normal with a vestigial S1-S2 disc space, the same numbering system used on the 2015 MRI.  Alignment:  Stable since 2015.  Relatively preserved lordosis.  Vertebrae: No marrow edema or evidence of acute osseous  abnormality. Visualized bone marrow signal is within normal limits. Negative visible sacral ala and SI joints.  Conus medullaris: Extends to the L2 level and appears normal.  Paraspinal and other soft tissues: Diverticulosis of the visible sigmoid colon. Other Visualized abdominal/pelvic viscera and paraspinal soft tissues are within normal limits.  Disc levels:  Multilevel lower thoracic disc space loss and disc bulging appears stable without significant lower thoracic spinal stenosis (series 2, image 7).  T12-L1:  Mild facet hypertrophy.  No stenosis.  L1-L2:  Stable mild facet hypertrophy.  No stenosis.  L2-L3:  Stable mild facet hypertrophy.  No stenosis.  L3-L4: Mild chronic circumferential disc bulge with broad-based posterior component. Mild to moderate facet hypertrophy. Increased mild to moderate ligament flavum hypertrophy since 2015 (series 5, image 15). Stable borderline to mild spinal stenosis.  L4-L5: Chronic mild circumferential disc bulge and endplate spurring with broad-based posterior component. Chronic moderate to severe facet and ligament flavum hypertrophy appears stable aside from some additional right facet hypertrophy since 2015 (series 5, image 19 today versus series 7, image 25 previously). Chronic epidural lipomatosis. Chronic moderate to severe spinal stenosis has not significantly changed. No foraminal stenosis.  L5-S1: Chronic circumferential disc bulge eccentric to the left with endplate spurring. Broad-based posterior component. Chronic very severe facet hypertrophy with interval increased bilateral facet joint fluid (series 5, images 23 and 24). Mild epidural lipomatosis at this level appears stable. Stable mild spinal stenosis with moderate left lateral recess stenosis (descending left S1 nerve root level). Moderate to severe left L5 foraminal stenosis appears stable. Right foraminal stenosis appears increased and now moderate.  S1-S2:  Vestigial disc space. Predominately non sclerotic left assimilation joint.  IMPRESSION: 1. Chronic multifactorial moderate to severe spinal stenosis at L4-L5 has not significantly changed since 2015. 2. Chronic very severe facet arthropathy at L5-S1 has progressed with increased facet joint fluid. Chronic disc and endplate degeneration at that level is stable. Stable mild spinal and moderate left lateral recess stenosis at that level although moderate to severe L5 foraminal stenosis is progressed on the right. 3. Mild interval ligament flavum hypertrophy at L3-L4 with stable borderline to mild spinal stenosis at that level.   Electronically Signed   By: Genevie Ann M.D.   On: 06/24/2016 17:06    Results for orders placed during the hospital encounter of 04/24/17  CT KNEE RIGHT WO CONTRAST   Narrative CLINICAL DATA:  Osteoarthritis of the right knee.  Preop evaluation.  EXAM: CT OF  THE RIGHT KNEE WITHOUT CONTRAST  TECHNIQUE: Multidetector CT imaging of the RIGHT knee was performed according to the standard protocol. Multiplanar CT image reconstructions were also generated.  COMPARISON:  None.  FINDINGS: Bones/Joint/Cartilage  The hip demonstrates no fracture or dislocation. There is no lytic or blastic lesion.  The knee demonstrates no fracture or dislocation. There is no lytic or blastic lesion. There is severe osteoarthritis of the patellofemoral compartment. There is severe osteoarthritis with joint space narrowing, subchondral cystic changes, subchondral sclerosis and marginal osteophytosis involving the medial femorotibial compartment. There is marginal osteophytosis of the lateral femorotibial compartment without significant joint space narrowing. There is a small joint effusion.  The ankle demonstrates no fracture or dislocation. There is no lytic or blastic lesion.  Ligaments  Ligaments are suboptimally evaluated by CT.  Muscles and Tendons There is no  muscle atrophy. There is a 3.2 x 1 cm lipoma in the right adductor magnus muscle.  Soft tissue There is no fluid collection or hematoma. There is no other soft tissue mass.  IMPRESSION: 1. Tricompartmental osteoarthritis of the right knee most severe in the medial femorotibial compartment and patellofemoral compartment.   Electronically Signed   By: Kathreen Devoid   On: 04/24/2017 13:57     Results for orders placed during the hospital encounter of 07/22/17  DG Knee Complete 4 Views Right   Narrative CLINICAL DATA:  Fall, knee pain  EXAM: RIGHT KNEE - COMPLETE 4+ VIEW  COMPARISON:  06/23/2007  FINDINGS: No evidence of fracture, dislocation, or joint effusion. No evidence of arthropathy or other focal bone abnormality. Soft tissues are unremarkable.  IMPRESSION: Negative.   Electronically Signed   By: Rolm Baptise M.D.   On: 07/22/2017 20:53    Complexity Note: Imaging results reviewed. Results shared with Ms. Blethen, using [employer].                         ROS  Cardiovascular: High blood pressure Pulmonary or Respiratory: No reported pulmonary signs or symptoms such as wheezing and difficulty taking a deep full breath (Asthma), difficulty blowing air out (Emphysema), coughing up mucus (Bronchitis), persistent dry cough, or temporary stoppage of breathing during sleep Neurological: No reported neurological signs or symptoms such as seizures, abnormal skin sensations, urinary and/or fecal incontinence, being born with an abnormal open spine and/or a tethered spinal cord Review of Past Neurological Studies: No results found for this or any previous visit. Psychological-Psychiatric: No reported psychological or psychiatric signs or symptoms such as difficulty sleeping, anxiety, depression, delusions or hallucinations (schizophrenial), mood swings (bipolar disorders) or suicidal ideations or attempts Gastrointestinal: Heartburn due to stomach pushing into lungs  (Hiatal hernia) Genitourinary: No reported renal or genitourinary signs or symptoms such as difficulty voiding or producing urine, peeing blood, non-functioning kidney, kidney stones, difficulty emptying the bladder, difficulty controlling the flow of urine, or chronic kidney disease Hematological: Weakness due to low blood hemoglobin or red blood cell count (Anemia) Endocrine: No reported endocrine signs or symptoms such as high or low blood sugar, rapid heart rate due to high thyroid levels, obesity or weight gain due to slow thyroid or thyroid disease Rheumatologic: Joint aches and or swelling due to excess weight (Osteoarthritis) Musculoskeletal: Negative for myasthenia gravis, muscular dystrophy, multiple sclerosis or malignant hyperthermia Work History: Disabled  Allergies  Ms. Steger is allergic to morphine and related and tramadol.  Laboratory Chemistry Profile   Screening Lab Results  Component Value Date  STAPHAUREUS NEGATIVE 06/10/2017   MRSAPCR NEGATIVE 06/10/2017    Inflammation (CRP: Acute Phase) (ESR: Chronic Phase) No results found for: CRP, ESRSEDRATE, LATICACIDVEN                       Rheumatology No results found for: RF, ANA, LABURIC, URICUR, LYMEIGGIGMAB, LYMEABIGMQN, HLAB27                      Renal Lab Results  Component Value Date   BUN 16 11/04/2018   CREATININE 0.88 11/04/2018   GFRAA >60 11/04/2018   GFRNONAA >60 11/04/2018                             Hepatic Lab Results  Component Value Date   AST 21 11/04/2018   ALT 16 11/04/2018   ALBUMIN 3.8 11/04/2018   ALKPHOS 124 11/04/2018                        Electrolytes Lab Results  Component Value Date   NA 137 11/04/2018   K 4.2 11/04/2018   CL 105 11/04/2018   CALCIUM 8.5 (L) 11/04/2018                        Neuropathy Lab Results  Component Value Date   URKYHCWC37 628 11/04/2018   FOLATE 5.7 (L) 11/04/2018                        Coagulation Lab Results  Component Value Date    PLT 293 11/04/2018                        Cardiovascular Lab Results  Component Value Date   HGB 10.8 (L) 11/04/2018   HCT 34.2 (L) 11/04/2018                         ID Lab Results  Component Value Date   STAPHAUREUS NEGATIVE 06/10/2017   MRSAPCR NEGATIVE 06/10/2017    Note: Lab results reviewed.  [place]  Drug: Ms. Heward  reports no history of drug use. Alcohol:  reports current alcohol use of about 2.0 standard drinks of alcohol per week. Tobacco:  reports that she quit smoking about 3 years ago. She smoked 0.50 packs per day. She has quit using smokeless tobacco. Medical:  has a past medical history of Anxiety, Arthritis, Headache, Hypertension, Iron deficiency anemia (06/17/2018), Obesity, and Sleep apnea. Family: family history includes Arthritis in her mother; Cancer in her father; Hypertension in her mother.  Past Surgical History:  Procedure Laterality Date  . ABDOMINAL HYSTERECTOMY    . BREAST EXCISIONAL BIOPSY Right 1989  . CHOLECYSTECTOMY    . COLONOSCOPY WITH ESOPHAGOGASTRODUODENOSCOPY (EGD)    . csection x2    . DILATION AND CURETTAGE OF UTERUS    . ESOPHAGOGASTRODUODENOSCOPY (EGD) WITH PROPOFOL N/A 12/14/2015   Procedure: ESOPHAGOGASTRODUODENOSCOPY (EGD) WITH PROPOFOL;  Surgeon: Manya Silvas, MD;  Location: [facility] ENDOSCOPY;  Service: Endoscopy;  Laterality: N/A;  . GASTRIC BYPASS    . JOINT REPLACEMENT    . left knee surgery    . TOTAL KNEE ARTHROPLASTY Right 06/22/2017   Procedure: TOTAL KNEE ARTHROPLASTY;  Surgeon: Lovell Sheehan, MD;  Location: [facility] ORS;  Service: Orthopedics;  Laterality: Right;   Active Ambulatory Problems  Diagnosis Date Noted  . History of total right knee replacement 06/22/2017  . Iron deficiency anemia 06/17/2018  . Spinal stenosis, lumbar region, with neurogenic claudication 01/18/2019  . Lumbar facet arthropathy 01/18/2019  . Lumbar spondylosis 01/18/2019  . Lumbar degenerative disc disease 01/18/2019  . Primary  osteoarthritis of left knee 01/18/2019  . Chronic pain syndrome 01/18/2019   Resolved Ambulatory Problems    Diagnosis Date Noted  . No Resolved Ambulatory Problems   Past Medical History:  Diagnosis Date  . Anxiety   . Arthritis   . Headache   . Hypertension   . Obesity   . Sleep apnea    Constitutional Exam  General appearance: Well nourished, well developed, and well hydrated. In no apparent acute distress Vitals:   01/18/19 0904  BP: (!) 161/89  Pulse: 78  Resp: 16  Temp: 99.1 F (37.3 C)  TempSrc: Oral  SpO2: 100%  Weight: 183 lb (83 kg)  Height: '4\' 11"'  (1.499 m)   BMI Assessment: Estimated body mass index is 36.96 kg/m as calculated from the following:   Height as of this encounter: '4\' 11"'  (1.499 m).   Weight as of this encounter: 183 lb (83 kg).  BMI interpretation table: BMI level Category Range association with higher incidence of chronic pain  <18 kg/m2 Underweight   18.5-24.9 kg/m2 Ideal body weight   25-29.9 kg/m2 Overweight Increased incidence by 20%  30-34.9 kg/m2 Obese (Class I) Increased incidence by 68%  35-39.9 kg/m2 Severe obesity (Class II) Increased incidence by 136%  >40 kg/m2 Extreme obesity (Class III) Increased incidence by 254%   Patient's current BMI Ideal Body weight  Body mass index is 36.96 kg/m. Patient must be at least 60 in tall to calculate ideal body weight   BMI Readings from Last 4 Encounters:  01/18/19 36.96 kg/m  11/04/18 37.77 kg/m  06/17/18 34.62 kg/m  07/22/17 40.44 kg/m   Wt Readings from Last 4 Encounters:  01/18/19 183 lb (83 kg)  11/04/18 187 lb (84.8 kg)  06/17/18 171 lb 6.4 oz (77.7 kg)  07/22/17 174 lb (78.9 kg)  Psych/Mental status: Alert, oriented x 3 (person, place, & time)       Eyes: PERLA Respiratory: No evidence of acute respiratory distress  Cervical Spine Area Exam  Skin & Axial Inspection: No masses, redness, edema, swelling, or associated skin lesions Alignment: Symmetrical Functional  ROM: Decreased ROM      Stability: No instability detected Muscle Tone/Strength: Functionally intact. No obvious neuro-muscular anomalies detected. Sensory (Neurological): Musculoskeletal pain pattern Palpation: No palpable anomalies              Upper Extremity (UE) Exam    Side: Right upper extremity  Side: Left upper extremity  Skin & Extremity Inspection: Skin color, temperature, and hair growth are WNL. No peripheral edema or cyanosis. No masses, redness, swelling, asymmetry, or associated skin lesions. No contractures.  Skin & Extremity Inspection: Skin color, temperature, and hair growth are WNL. No peripheral edema or cyanosis. No masses, redness, swelling, asymmetry, or associated skin lesions. No contractures.  Functional ROM: Pain restricted ROM for shoulder and elbow  Functional ROM: Pain restricted ROM for all joints of upper extremity  Muscle Tone/Strength: Functionally intact. No obvious neuro-muscular anomalies detected.  Muscle Tone/Strength: Functionally intact. No obvious neuro-muscular anomalies detected.  Sensory (Neurological): Musculoskeletal pain pattern          Sensory (Neurological): Musculoskeletal pain pattern          Palpation: No palpable  anomalies              Palpation: No palpable anomalies              Provocative Test(s):  Phalen's test: deferred Tinel's test: deferred Apley's scratch test (touch opposite shoulder):  Action 1 (Across chest): Decreased ROM Action 2 (Overhead): Decreased ROM Action 3 (LB reach): Decreased ROM   Provocative Test(s):  Phalen's test: deferred Tinel's test: deferred Apley's scratch test (touch opposite shoulder):  Action 1 (Across chest): Decreased ROM Action 2 (Overhead): Decreased ROM Action 3 (LB reach): Decreased ROM    Thoracic Spine Area Exam  Skin & Axial Inspection: No masses, redness, or swelling Alignment: Asymmetric Functional ROM: Unrestricted ROM Stability: No instability detected Muscle Tone/Strength:  Functionally intact. No obvious neuro-muscular anomalies detected. Sensory (Neurological): Musculoskeletal pain pattern Muscle strength & Tone: No palpable anomalies  Lumbar Spine Area Exam  Skin & Axial Inspection: Lumbar Scoliosis Alignment: Asymmetric Functional ROM: Decreased ROM affecting both sides Stability: No instability detected Muscle Tone/Strength: Functionally intact. No obvious neuro-muscular anomalies detected. Sensory (Neurological): Articular pain pattern Palpation: Complains of area being tender to palpation       Provocative Tests: Hyperextension/rotation test: (+) bilaterally for facet joint pain. Lumbar quadrant test (Kemp's test): deferred today       Lateral bending test: deferred today       Patrick's Maneuver: deferred today                   FABER* test: deferred today                   S-I anterior distraction/compression test: deferred today         S-I lateral compression test: deferred today         S-I Thigh-thrust test: deferred today         S-I Gaenslen's test: deferred today         *(Flexion, ABduction and External Rotation)  Gait & Posture Assessment  Ambulation: Patient ambulates using a wheel chair Gait: Significantly limited. Dependent on assistive device to ambulate Posture: Difficulty with positional changes   Lower Extremity Exam    Side: Right lower extremity  Side: Left lower extremity  Stability: No instability observed          Stability: No instability observed          Skin & Extremity Inspection: Evidence of prior arthroplastic surgery  Skin & Extremity Inspection: Edema  Functional ROM: Decreased ROM for all joints of the lower extremity          Functional ROM: Decreased ROM for all joints of the lower extremity          Muscle Tone/Strength: Functionally intact. No obvious neuro-muscular anomalies detected.  Muscle Tone/Strength: Functionally intact. No obvious neuro-muscular anomalies detected.  Sensory (Neurological):  Arthropathic arthralgia        Sensory (Neurological): Arthropathic arthralgia        DTR: Patellar: 0: absent Achilles: 0: absent Plantar: deferred today  DTR: Patellar: 0: absent Achilles: 0: absent Plantar: deferred today  Palpation: No palpable anomalies  Palpation: No palpable anomalies   Assessment  Primary Diagnosis & Pertinent Problem List: The primary encounter diagnosis was Spinal stenosis, lumbar region, with neurogenic claudication. Diagnoses of Lumbar facet arthropathy, Lumbar spondylosis, Lumbar degenerative disc disease, Primary osteoarthritis of left knee, History of total right knee replacement, Chronic pain syndrome, Thoracic spine pain, Chronic pain of both shoulders, Cervicalgia, and Bilateral primary osteoarthritis of  knee were also pertinent to this visit.  Visit Diagnosis (New problems to examiner): 1. Spinal stenosis, lumbar region, with neurogenic claudication   2. Lumbar facet arthropathy   3. Lumbar spondylosis   4. Lumbar degenerative disc disease   5. Primary osteoarthritis of left knee   6. History of total right knee replacement   7. Chronic pain syndrome   8. Thoracic spine pain   9. Chronic pain of both shoulders   10. Cervicalgia   11. Bilateral primary osteoarthritis of knee    Plan of Care (Initial workup plan)  Note: Ms. Kishi was reminded that as per protocol, today's visit has been an evaluation only. We have not taken over the patient's controlled substance management.  1. Spinal stenosis, lumbar region, with neurogenic claudication -Stable, discussed physical therapy exercises.  2. Lumbar facet arthropathy -Lumbar MRI shows diffuse facet hypertrophy, facet arthropathy and associated lumbar spondylosis. -TASNEEM CORMIER has a history of greater than 3 months of moderate to severe pain which is resulted in functional impairment.  The patient has tried various conservative therapeutic options such as NSAIDs, Tylenol, muscle relaxants, physical  therapy which was inadequately effective.  Patient's pain is predominantly axial with physical exam findings suggestive of facet arthropathy. Lumbar facet medial branch nerve blocks were discussed with the patient.  Risks and benefits were reviewed.  PRN order placed L2, L3, L4, L5 medial branch nerve block. - LUMBAR FACET(MEDIAL BRANCH NERVE BLOCK) MBNB; Standing  3. Lumbar spondylosis -PRN diagnostic lumbar facet medial branch nerve blocks at L2, L3, L4, L5 bilaterally - LUMBAR FACET(MEDIAL BRANCH NERVE BLOCK) MBNB; Standing  4. Lumbar degenerative disc disease -Stable, continue to monitor  5. Primary osteoarthritis of left knee -Consider genicular nerve block, as needed order placed  6. History of total right knee replacement -Consider genicular nerve block, as needed replaced  7. Chronic pain syndrome -Increase gabapentin to 300 mg twice a day.  Had a frank discussion with the patient regarding her oxycodone which I told her was too high of a dose.  Patient could be a candidate for buprenorphine, tramadol, hydrocodone but do not recommend oxycodone at this time. - Compliance Drug Analysis, Ur - Ambulatory referral to Psychology -After UDS and pain psych can consider opioid analgesics such as buprenorphine, tramadol, hydrocodone.  8. Thoracic spine pain - DG Thoracic Spine 2 View; Future  9. Chronic pain of both shoulders - DG Shoulder Right; Future - DG Shoulder Left; Future -Can consider intra-articular shoulder steroid injection, suprascapular nerve block pending diagnostic studies  10. Cervicalgia - DG Cervical Spine With Flex & Extend; Future  11. Bilateral primary osteoarthritis of knee - GENICULAR NERVE BLOCK; Standing    Lab Orders     Compliance Drug Analysis, Ur  Imaging Orders     DG Cervical Spine With Flex & Extend     DG Thoracic Spine 2 View     DG Shoulder Right     DG Shoulder Left  Referral Orders     Ambulatory referral to Psychology  Procedure  Orders     LUMBAR FACET(MEDIAL BRANCH NERVE BLOCK) MBNB     GENICULAR NERVE BLOCK Pharmacotherapy (current): Medications ordered:  Meds ordered this encounter  Medications  . gabapentin (NEURONTIN) 300 MG capsule    Sig: Take 1 capsule (300 mg total) by mouth 2 (two) times daily.    Dispense:  60 capsule    Refill:  2   Medications administered during this visit: Benancio Deeds. Shehan had no medications  administered during this visit.   Pharmacological management options:  Opioid Analgesics: The patient was informed that there is no guarantee that she would be a candidate for opioid analgesics. The decision will be made following CDC guidelines. This decision will be based on the results of diagnostic studies, as well as Ms. Schissler's risk profile.   Membrane stabilizer: Increase gabapentin to 300 mg twice daily.  Can consider Lyrica or Cymbalta in future  Muscle relaxant: To be determined at a later time  NSAID: Medically contraindicated history of gastric bypass  Other analgesic(s): To be determined at a later time   Interventional management options: Ms. Castrillo was informed that there is no guarantee that she would be a candidate for interventional therapies. The decision will be based on the results of diagnostic studies, as well as Ms. Batson's risk profile.  Procedure(s) under consideration:  -Diagnostic lumbar facet medial branch nerve blocks at L2, L3, L4, L5 for lumbar facet arthropathy and lumbar spondylosis consistent with physical exam and diagnostic imaging -PRN genicular nerve block for persistent knee pain, history of right knee replacement, left knee osteoarthritis -Consideration of bilateral shoulder steroid injection versus suprascapular nerve block pending imaging studies    Provider-requested follow-up: Return for After Psychological evaluation, PRN.  Future Appointments  Date Time Provider [place]  01/28/2019 10:45 AM [facility] None  01/31/2019   1:30 PM Earlie Server, MD [facility] None  01/31/2019  2:00 PM [facility]ION [facility] [address] [place] None    Primary Care Physician: Tracie Harrier, MD Location: [facility] Note by: Gillis Santa, MD Date: 01/18/2019; Time: 1:35 PM  Note: This dictation was prepared with Dragon dictation. Any transcriptional errors that may result from this process are unintentional.

## 2019-01-20 LAB — COMPLIANCE DRUG ANALYSIS, UR

## 2019-01-28 ENCOUNTER — Inpatient Hospital Stay: Payer: 59

## 2019-01-31 ENCOUNTER — Inpatient Hospital Stay: Payer: 59 | Admitting: Oncology

## 2019-01-31 ENCOUNTER — Inpatient Hospital Stay: Payer: 59

## 2019-02-04 ENCOUNTER — Inpatient Hospital Stay: Payer: 59

## 2019-02-04 ENCOUNTER — Other Ambulatory Visit: Payer: Self-pay

## 2019-02-04 ENCOUNTER — Telehealth: Payer: Self-pay | Admitting: *Deleted

## 2019-02-04 NOTE — Telephone Encounter (Signed)
Patient called reporting that she has GI upset this morning with diarrhea and abdominal pain, she is seeing her PCP at noon and does not want to come in for her lab appointment today unless you tell her otherwise

## 2019-02-04 NOTE — Telephone Encounter (Signed)
Does she also want to reschedule her appts on Monday?  Need for iron is depending on the lab results from today.

## 2019-02-04 NOTE — Telephone Encounter (Signed)
Agree with you. Need to reschedule labs, to be done prior to MD and iron. Thanks.

## 2019-02-04 NOTE — Progress Notes (Signed)
Patient reported positive covid symptoms to caller. She has contacted her PCP and is seeing him today. Instructed her to cancel Monday appointment if positive for Covid-19.

## 2019-02-07 ENCOUNTER — Inpatient Hospital Stay: Payer: 59 | Admitting: Oncology

## 2019-02-07 ENCOUNTER — Inpatient Hospital Stay: Payer: 59

## 2019-02-08 ENCOUNTER — Encounter: Payer: Self-pay | Admitting: Psychiatry

## 2019-02-08 ENCOUNTER — Ambulatory Visit (INDEPENDENT_AMBULATORY_CARE_PROVIDER_SITE_OTHER): Payer: 59 | Admitting: Psychiatry

## 2019-02-08 ENCOUNTER — Other Ambulatory Visit: Payer: Self-pay

## 2019-02-08 DIAGNOSIS — G894 Chronic pain syndrome: Secondary | ICD-10-CM

## 2019-02-08 DIAGNOSIS — Z008 Encounter for other general examination: Secondary | ICD-10-CM

## 2019-02-08 NOTE — Progress Notes (Signed)
Virtual Visit via Video Note  I connected with Christine Reyes on 02/08/19 at  3:15 PM EDT by a video enabled telemedicine application and verified that I am speaking with the correct person using two identifiers.   I discussed the limitations of evaluation and management by telemedicine and the availability of in person appointments. The patient expressed understanding and agreed to proceed.   I discussed the assessment and treatment plan with the patient. The patient was provided an opportunity to ask questions and all were answered. The patient agreed with the plan and demonstrated an understanding of the instructions.   The patient was advised to call back or seek an in-person evaluation if the symptoms worsen or if the condition fails to improve as anticipated.    Psychiatric Initial Adult Assessment   Patient Identification: Christine Reyes MRN:  [id number] Date of Evaluation:  02/08/2019 Referral Source: Dr.Bilal Lateef  Chief Complaint:   Chief Complaint    Establish Care     Visit Diagnosis:    ICD-10-CM   1. Evaluation by psychiatric service required  Z00.8   2. Chronic pain syndrome  G89.4     History of Present Illness: [name] is a 63 year old African-American female, separated, lives in [place], has a history of total knee replacement, iron deficiency anemia, spinal stenosis, lumbar region pain with neurogenic claudication, lumbar degenerative disc disease, primary osteoarthritis of left knee, chronic pain syndrome, knee pain status post joint replacement was evaluated by telemedicine today.  Patient was referred to the clinic for routine assessment of possible mental health and substance abuse risk potential.  Patient reports she has never been diagnosed with depression or any other mental health problems in the past.  She has never been under the treatment of a psychiatrist or a therapist in the past.  She denies any recent depressive symptoms.  She denies  any recent anxiety symptoms.  She reports sleep is good.  Patient denies any suicidality, homicidality or perceptual disturbances.  Patient denies any history of trauma.  Patient denies any substance abuse problems.  Patient reports she has been struggling with pain since the past 10 years or so.  She currently rates her pain at 6 or 7, 10 being the worst.  She reports she is unable to cope with her pain and needs to be on medications.  She is currently under the care of Dr. Holley Raring for the same.  She reports she has been on opiate medications in the past however denies misusing or abusing it.  Patient reports she has support system from her daughter and her grandchildren.     Associated Signs/Symptoms: Depression Symptoms:  Denies (Hypo) Manic Symptoms:  Denies Anxiety Symptoms:  Denies Psychotic Symptoms:  Denies PTSD Symptoms: Negative  Past Psychiatric History: Patient denies inpatient mental health admissions.  Patient denies any suicide attempts.  Patient denies previous treatment for depression or any other mental health problems.  Previous Psychotropic Medications: No   Substance Abuse History in the last 12 months:  No.  Consequences of Substance Abuse: Negative  Past Medical History:  Past Medical History:  Diagnosis Date  . Anxiety   . Arthritis   . Headache   . Hypertension   . Iron deficiency anemia 06/17/2018  . Obesity   . Sleep apnea     Past Surgical History:  Procedure Laterality Date  . ABDOMINAL HYSTERECTOMY    . BREAST EXCISIONAL BIOPSY Right 1989  . CHOLECYSTECTOMY    . COLONOSCOPY WITH ESOPHAGOGASTRODUODENOSCOPY (  EGD)    . csection x2    . DILATION AND CURETTAGE OF UTERUS    . ESOPHAGOGASTRODUODENOSCOPY (EGD) WITH PROPOFOL N/A 12/14/2015   Procedure: ESOPHAGOGASTRODUODENOSCOPY (EGD) WITH PROPOFOL;  Surgeon: Manya Silvas, MD;  Location: [facility] ENDOSCOPY;  Service: Endoscopy;  Laterality: N/A;  . GASTRIC BYPASS    . JOINT REPLACEMENT    .  left knee surgery    . TOTAL KNEE ARTHROPLASTY Right 06/22/2017   Procedure: TOTAL KNEE ARTHROPLASTY;  Surgeon: Lovell Sheehan, MD;  Location: [facility] ORS;  Service: Orthopedics;  Laterality: Right;    Family Psychiatric History: Daughter-bipolar disorder(deceased)  Family History:  Family History  Problem Relation Age of Onset  . Hypertension Mother   . Arthritis Mother   . Cancer Father   . Bipolar disorder Daughter   . Breast cancer Neg Hx     Social History:   Social History   Socioeconomic History  . Marital status: Married    Spouse name: Not on file  . Number of children: Not on file  . Years of education: Not on file  . Highest education level: Not on file  Occupational History  . Not on file  Social Needs  . Financial resource strain: Not on file  . Food insecurity    Worry: Not on file    Inability: Not on file  . Transportation needs    Medical: Not on file    Non-medical: Not on file  Tobacco Use  . Smoking status: Former Smoker    Packs/day: 0.50    Quit date: 06/11/2015    Years since quitting: 3.6  . Smokeless tobacco: Former Network engineer and Sexual Activity  . Alcohol use: Yes    Alcohol/week: 2.0 standard drinks    Types: 2 Glasses of wine per week  . Drug use: No  . Sexual activity: Not on file  Lifestyle  . Physical activity    Days per week: Not on file    Minutes per session: Not on file  . Stress: Not on file  Relationships  . Social Herbalist on phone: Not on file    Gets together: Not on file    Attends religious service: Not on file    Active member of club or organization: Not on file    Attends meetings of clubs or organizations: Not on file    Relationship status: Not on file  Other Topics Concern  . Not on file  Social History Narrative  . Not on file    Additional Social History: Patient currently lives in [place].  She is on disability.  She had a normal childhood.  She was married x2.  Divorced x1.  She is  currently separated from her current husband.  She has 1 living daughter and grandchildrens. One of her daughters passed away of natural causes few years ago.  Patient denies any legal problems.  Patient denies any history of trauma.  Allergies:   Allergies  Allergen Reactions  . Morphine And Related Itching  . Tramadol Itching    Metabolic Disorder Labs: No results found for: HGBA1C, MPG No results found for: PROLACTIN No results found for: CHOL, TRIG, HDL, CHOLHDL, VLDL, LDLCALC No results found for: TSH  Therapeutic Level Labs: No results found for: LITHIUM No results found for: CBMZ No results found for: VALPROATE  Current Medications: Current Outpatient Medications  Medication Sig Dispense Refill  . dicyclomine (BENTYL) 20 MG tablet Take by mouth.    Marland Kitchen  ondansetron (ZOFRAN-ODT) 4 MG disintegrating tablet Take by mouth.    . Oxycodone HCl 20 MG TABS Take by mouth.    . cholecalciferol (VITAMIN D) 1000 units tablet Take 1,000 Units by mouth daily.    Marland Kitchen dicyclomine (BENTYL) 20 MG tablet     . folic acid (V-R FOLIC ACID) 998 MCG tablet Take 1 tablet (400 mcg total) by mouth daily. 90 tablet 3  . gabapentin (NEURONTIN) 300 MG capsule Take 1 capsule (300 mg total) by mouth 2 (two) times daily. 60 capsule 2  . hydrochlorothiazide (HYDRODIURIL) 25 MG tablet Take 1 tablet (25 mg total) by mouth daily for 30 doses. (Patient not taking: Reported on 02/04/2019) 30 tablet 0  . Iron-Vitamin C (VITRON-C) 65-125 MG TABS Take 1 tablet by mouth daily. 60 tablet 2  . magnesium gluconate (MAGONATE) 500 MG tablet Take 500 mg by mouth daily.    . Multiple Vitamins-Minerals (MULTIVITAMIN WITH MINERALS) tablet Take 1 tablet by mouth daily.    . naloxone (NARCAN) 4 MG/0.1ML LIQD nasal spray kit Inhale 4 mg into the lungs as needed.    Marland Kitchen olmesartan-hydrochlorothiazide (BENICAR HCT) 40-25 MG tablet Take 1 tablet by mouth daily.  5  . ondansetron (ZOFRAN-ODT) 4 MG disintegrating tablet     . Oxycodone  HCl 20 MG TABS Take 20 mg by mouth 2 (two) times daily as needed.    . pantoprazole (PROTONIX) 40 MG tablet Take 40 mg by mouth 2 (two) times daily as needed (heartburn).     . senna (SENOKOT) 8.6 MG TABS tablet Take 1 tablet (8.6 mg total) by mouth 2 (two) times daily. 120 each 0  . Simethicone (GAS-X EXTRA STRENGTH) 125 MG CAPS Take 1 capsule by mouth 3 (three) times daily as needed (gas pains).    . Vitamin D, Ergocalciferol, (DRISDOL) 1.25 MG (50000 UT) CAPS capsule Take 50,000 Units by mouth every 7 (seven) days.     Marland Kitchen ZINC SULFATE PO Take by mouth.     No current facility-administered medications for this visit.     Musculoskeletal: Strength & Muscle Tone: UTA Gait & Station: Walks with a cane Patient leans: N/A  Psychiatric Specialty Exam: Review of Systems  Musculoskeletal: Positive for back pain and joint pain.  Psychiatric/Behavioral: Negative for depression, hallucinations, substance abuse and suicidal ideas. The patient is not nervous/anxious.   All other systems reviewed and are negative.   There were no vitals taken for this visit.There is no height or weight on file to calculate BMI.  General Appearance: Casual  Eye Contact:  Fair  Speech:  Clear and Coherent  Volume:  Normal  Mood:  Euthymic  Affect:  Congruent  Thought Process:  Goal Directed and Descriptions of Associations: Intact  Orientation:  Full (Time, Place, and Person)  Thought Content:  Logical  Suicidal Thoughts:  No  Homicidal Thoughts:  No  Memory:  Immediate;   Fair Recent;   Fair Remote;   Fair  Judgement:  Fair  Insight:  Fair  Psychomotor Activity:  Normal  Concentration:  Concentration: Fair and Attention Span: Fair  Recall:  [employer] of Knowledge:Fair  Language: Fair  Akathisia:  No  Handed:  Left  AIMS (if indicated): denies tremors, rigidity  Assets:  Communication Skills Desire for Improvement Housing  ADL's:  Intact  Cognition: WNL  Sleep:  Fair    Screenings:   Assessment and Plan: [name] is a 63 year old African-American female who has a history of chronic pain, iron  deficiency anemia, was evaluated by telemedicine today.  Patient was referred to the clinic for routine assessment of possible mental health/substance abuse risk potential.  The following instruments were used Clinical interview Screener and opioid assessment for patients with pain/revised Opioid risk tool Drug abuse screening test Alcohol use disorder identification test PHQ 9 GAD 7  Based on clinical interview and instrument used at the time of evaluation the risk is determined to be low.  I have spent atleast 45 minutes non face to face with patient today. More than 50 % of the time was spent for psychoeducation and supportive psychotherapy and care coordination. This note was generated in part or whole with voice recognition software. Voice recognition is usually quite accurate but there are transcription errors that can and very often do occur. I apologize for any typographical errors that were not detected and corrected.       Ursula Alert, MD 9/29/20203:45 PM

## 2019-02-15 ENCOUNTER — Inpatient Hospital Stay: Payer: 59

## 2019-02-16 ENCOUNTER — Inpatient Hospital Stay: Payer: 59 | Admitting: Oncology

## 2019-02-16 ENCOUNTER — Inpatient Hospital Stay: Payer: 59

## 2019-02-16 ENCOUNTER — Telehealth: Payer: Self-pay | Admitting: *Deleted

## 2019-02-16 NOTE — Telephone Encounter (Signed)
Patient called and stated that she tested positive for Covid19 and needed to cancel her 02/16/19 MD/+/- Venofer appt.. She was a No Show for her scheduled 02/15/19 lab appt.Marland Kitchen all appts are needing to be r/s for a later date.Marland Kitchen

## 2019-04-26 ENCOUNTER — Other Ambulatory Visit: Payer: Self-pay | Admitting: Student in an Organized Health Care Education/Training Program

## 2019-06-24 ENCOUNTER — Other Ambulatory Visit: Payer: Self-pay | Admitting: Internal Medicine

## 2019-06-24 DIAGNOSIS — Z1231 Encounter for screening mammogram for malignant neoplasm of breast: Secondary | ICD-10-CM

## 2020-11-06 ENCOUNTER — Encounter: Payer: Self-pay | Admitting: Obstetrics & Gynecology

## 2020-11-06 ENCOUNTER — Other Ambulatory Visit: Payer: Self-pay

## 2020-11-06 ENCOUNTER — Encounter: Payer: Self-pay | Admitting: Oncology

## 2020-11-06 ENCOUNTER — Ambulatory Visit (INDEPENDENT_AMBULATORY_CARE_PROVIDER_SITE_OTHER): Payer: Medicare Other | Admitting: Obstetrics & Gynecology

## 2020-11-06 VITALS — BP 111/75 | HR 86 | Ht 59.0 in | Wt 185.0 lb

## 2020-11-06 DIAGNOSIS — Z1231 Encounter for screening mammogram for malignant neoplasm of breast: Secondary | ICD-10-CM

## 2020-11-06 DIAGNOSIS — Z01419 Encounter for gynecological examination (general) (routine) without abnormal findings: Secondary | ICD-10-CM

## 2020-11-06 NOTE — Progress Notes (Signed)
GYNECOLOGY ANNUAL PREVENTATIVE CARE ENCOUNTER NOTE  History:     Christine Reyes is a 65 y.o. PMP female s/p abdominal hysterectomy many years ago for benign reasons here for a routine annual gynecologic exam and to establish care.  Last seen by [facility] in 2019. Current complaints: none.   Not sexually active.  Denies abnormal vaginal bleeding, discharge, pelvic pain, or other gynecologic concerns.    Gynecologic History No LMP recorded. Patient has had a hysterectomy. Last mammogram: 03/19/2017. Results were: normal  Obstetric History OB History  Gravida Para Term Preterm AB Living  _0 SAB IAB Ectopic Multiple Live Births  1       2    # Outcome Date GA Lbr Len/2nd Weight Sex Delivery Anes PTL Lv  3 Para           2 Para           1 SAB             Past Medical History:  Diagnosis Date   Anxiety    Arthritis    Headache    Hypertension    Iron deficiency anemia 06/17/2018   Obesity    Sleep apnea     Past Surgical History:  Procedure Laterality Date   ABDOMINAL HYSTERECTOMY     BREAST EXCISIONAL BIOPSY Right 1989   CHOLECYSTECTOMY     COLONOSCOPY WITH ESOPHAGOGASTRODUODENOSCOPY (EGD)     csection x2     DILATION AND CURETTAGE OF UTERUS     ESOPHAGOGASTRODUODENOSCOPY (EGD) WITH PROPOFOL N/A 12/14/2015   Procedure: ESOPHAGOGASTRODUODENOSCOPY (EGD) WITH PROPOFOL;  Surgeon: Manya Silvas, MD;  Location: [facility] ENDOSCOPY;  Service: Endoscopy;  Laterality: N/A;   GASTRIC BYPASS     JOINT REPLACEMENT     left knee surgery     TOTAL KNEE ARTHROPLASTY Right 06/22/2017   Procedure: TOTAL KNEE ARTHROPLASTY;  Surgeon: Lovell Sheehan, MD;  Location: [facility] ORS;  Service: Orthopedics;  Laterality: Right;    Current Outpatient Medications on File Prior to Visit  Medication Sig Dispense Refill   Aspirin-Salicylamide-Caffeine (ARTHRITIS STRENGTH BC POWDER PO) Take by mouth.     gabapentin (NEURONTIN) 300 MG capsule Take 1 capsule (300 mg total) by mouth 2 (two)  times daily. 60 capsule 2   olmesartan-hydrochlorothiazide (BENICAR HCT) 40-25 MG tablet Take 1 tablet by mouth daily.  5   Oxycodone HCl 20 MG TABS Take 20 mg by mouth 2 (two) times daily as needed.     Oxycodone HCl 20 MG TABS Take by mouth.     pantoprazole (PROTONIX) 40 MG tablet Take 40 mg by mouth 2 (two) times daily as needed (heartburn).      dicyclomine (BENTYL) 20 MG tablet Take by mouth.     naloxone (NARCAN) 4 MG/0.1ML LIQD nasal spray kit Inhale 4 mg into the lungs as needed.     No current facility-administered medications on file prior to visit.    Allergies  Allergen Reactions   Morphine And Related Itching   Tramadol Itching    Social History:  reports that she quit smoking about 5 years ago. Her smoking use included cigarettes. She smoked an average of 0.50 packs per day. She has quit using smokeless tobacco. She reports current alcohol use of about 2.0 standard drinks of alcohol per week. She reports that she does not use drugs.  Family History  Problem Relation Age of Onset   Hypertension Mother  Arthritis Mother    Cancer Father    Bipolar disorder Daughter    Breast cancer Neg Hx     The following portions of the patient's history were reviewed and updated as appropriate: allergies, current medications, past family history, past medical history, past social history, past surgical history and problem list.  Review of Systems Pertinent items noted in HPI and remainder of comprehensive ROS otherwise negative.  Physical Exam:  BP 111/75   Pulse 86   Ht _0  (1.499 m)   Wt 185 lb (83.9 kg)   BMI 37.37 kg/m  CONSTITUTIONAL: Well-developed, well-nourished female in no acute distress.  HENT:  Normocephalic, atraumatic, External right and left ear normal.  EYES: Conjunctivae and EOM are normal. Pupils are equal, round, and reactive to light. No scleral icterus.  NECK: Normal range of motion, supple, no masses.  Normal thyroid.  SKIN: Skin is warm and dry.  No rash noted. Not diaphoretic. No erythema. No pallor. NEUROLOGIC: Alert and oriented to person, place, and time. Normal reflexes, muscle tone coordination.  PSYCHIATRIC: Normal mood and affect. Normal behavior. Normal judgment and thought content. CARDIOVASCULAR: Normal heart rate noted, regular rhythm RESPIRATORY: Clear to auscultation bilaterally. Effort and breath sounds normal, no problems with respiration noted. BREASTS: Symmetric in size. No masses, tenderness, skin changes, nipple drainage, or lymphadenopathy bilaterally. Performed in the presence of a chaperone. ABDOMEN: Soft, no distention noted.  No tenderness, rebound or guarding.  PELVIC: Normal appearing external genitalia and urethral meatus with moderate atrophy; atrophic vaginal mucosa and cuff.  No abnormal discharge noted.   Performed in the presence of a chaperone.   Assessment and Plan:     1. Well woman exam with routine gynecological exam Normal postmenopausal gynecologic exam. No paps indicated given post-hysterectomy status, and was done for benign indications.  No GYN concerns.   2. Breast cancer screening by mammogram Mammogram scheduled.  - MM 3D SCREEN BREAST BILATERAL; Future Routine preventative health maintenance measures emphasized. Please refer to After Visit Summary for other counseling recommendations.      Verita Schneiders, MD, [place] for [employer], [place]

## 2020-11-29 ENCOUNTER — Encounter: Payer: Self-pay | Admitting: Emergency Medicine

## 2020-11-29 ENCOUNTER — Emergency Department: Payer: Medicare Other

## 2020-11-29 ENCOUNTER — Emergency Department
Admission: EM | Admit: 2020-11-29 | Discharge: 2020-11-29 | Disposition: A | Discharge status: Other Acute Inpatient Hospital | Payer: Medicare Other | Attending: Student in an Organized Health Care Education/Training Program | Admitting: Student in an Organized Health Care Education/Training Program

## 2020-11-29 ENCOUNTER — Other Ambulatory Visit: Payer: Self-pay

## 2020-11-29 DIAGNOSIS — Z87891 Personal history of nicotine dependence: Secondary | ICD-10-CM | POA: Diagnosis not present

## 2020-11-29 DIAGNOSIS — I110 Hypertensive heart disease with heart failure: Secondary | ICD-10-CM | POA: Diagnosis not present

## 2020-11-29 DIAGNOSIS — K56601 Complete intestinal obstruction, unspecified as to cause: Secondary | ICD-10-CM | POA: Diagnosis not present

## 2020-11-29 DIAGNOSIS — Z79899 Other long term (current) drug therapy: Secondary | ICD-10-CM | POA: Insufficient documentation

## 2020-11-29 DIAGNOSIS — M25569 Pain in unspecified knee: Secondary | ICD-10-CM | POA: Insufficient documentation

## 2020-11-29 DIAGNOSIS — R109 Unspecified abdominal pain: Secondary | ICD-10-CM | POA: Diagnosis present

## 2020-11-29 DIAGNOSIS — Z7982 Long term (current) use of aspirin: Secondary | ICD-10-CM | POA: Diagnosis not present

## 2020-11-29 DIAGNOSIS — Z20822 Contact with and (suspected) exposure to covid-19: Secondary | ICD-10-CM | POA: Insufficient documentation

## 2020-11-29 DIAGNOSIS — I5031 Acute diastolic (congestive) heart failure: Secondary | ICD-10-CM | POA: Diagnosis not present

## 2020-11-29 DIAGNOSIS — Z96651 Presence of right artificial knee joint: Secondary | ICD-10-CM | POA: Diagnosis not present

## 2020-11-29 DIAGNOSIS — R509 Fever, unspecified: Secondary | ICD-10-CM | POA: Insufficient documentation

## 2020-11-29 DIAGNOSIS — K56609 Unspecified intestinal obstruction, unspecified as to partial versus complete obstruction: Secondary | ICD-10-CM

## 2020-11-29 LAB — CBC WITH DIFFERENTIAL/PLATELET
Abs Immature Granulocytes: 0.09 10*3/uL — ABNORMAL HIGH (ref 0.00–0.07)
Basophils Absolute: 0.1 10*3/uL (ref 0.0–0.1)
Basophils Relative: 0 %
Eosinophils Absolute: 0 10*3/uL (ref 0.0–0.5)
Eosinophils Relative: 0 %
HCT: 30.4 % — ABNORMAL LOW (ref 36.0–46.0)
Hemoglobin: 9.6 g/dL — ABNORMAL LOW (ref 12.0–15.0)
Immature Granulocytes: 1 %
Lymphocytes Relative: 7 %
Lymphs Abs: 1.2 10*3/uL (ref 0.7–4.0)
MCH: 30.9 pg (ref 26.0–34.0)
MCHC: 31.6 g/dL (ref 30.0–36.0)
MCV: 97.7 fL (ref 80.0–100.0)
Monocytes Absolute: 1.3 10*3/uL — ABNORMAL HIGH (ref 0.1–1.0)
Monocytes Relative: 8 %
Neutro Abs: 13.8 10*3/uL — ABNORMAL HIGH (ref 1.7–7.7)
Neutrophils Relative %: 84 %
Platelets: 443 10*3/uL — ABNORMAL HIGH (ref 150–400)
RBC: 3.11 MIL/uL — ABNORMAL LOW (ref 3.87–5.11)
RDW: 13.2 % (ref 11.5–15.5)
WBC: 16.4 10*3/uL — ABNORMAL HIGH (ref 4.0–10.5)
nRBC: 0 % (ref 0.0–0.2)

## 2020-11-29 LAB — URINALYSIS, COMPLETE (UACMP) WITH MICROSCOPIC
Bacteria, UA: NONE SEEN
Bilirubin Urine: NEGATIVE
Glucose, UA: NEGATIVE mg/dL
Hgb urine dipstick: NEGATIVE
Ketones, ur: NEGATIVE mg/dL
Leukocytes,Ua: NEGATIVE
Nitrite: NEGATIVE
Protein, ur: NEGATIVE mg/dL
Specific Gravity, Urine: 1.036 — ABNORMAL HIGH (ref 1.005–1.030)
pH: 5 (ref 5.0–8.0)

## 2020-11-29 LAB — RESP PANEL BY RT-PCR (FLU A&B, COVID) ARPGX2
Influenza A by PCR: NEGATIVE
Influenza B by PCR: NEGATIVE
SARS Coronavirus 2 by RT PCR: NEGATIVE

## 2020-11-29 LAB — COMPREHENSIVE METABOLIC PANEL
ALT: 10 U/L (ref 0–44)
AST: 19 U/L (ref 15–41)
Albumin: 3.2 g/dL — ABNORMAL LOW (ref 3.5–5.0)
Alkaline Phosphatase: 103 U/L (ref 38–126)
Anion gap: 12 (ref 5–15)
BUN: 36 mg/dL — ABNORMAL HIGH (ref 8–23)
CO2: 26 mmol/L (ref 22–32)
Calcium: 8.6 mg/dL — ABNORMAL LOW (ref 8.9–10.3)
Chloride: 100 mmol/L (ref 98–111)
Creatinine, Ser: 1.38 mg/dL — ABNORMAL HIGH (ref 0.44–1.00)
GFR, Estimated: 43 mL/min — ABNORMAL LOW (ref >60)
Glucose, Bld: 130 mg/dL — ABNORMAL HIGH (ref 70–99)
Potassium: 4.4 mmol/L (ref 3.5–5.1)
Sodium: 138 mmol/L (ref 135–145)
Total Bilirubin: 1.6 mg/dL — ABNORMAL HIGH (ref 0.3–1.2)
Total Protein: 7.2 g/dL (ref 6.5–8.1)

## 2020-11-29 LAB — LACTIC ACID, PLASMA: Lactic Acid, Venous: 2.6 mmol/L (ref 0.5–1.9)

## 2020-11-29 MED ORDER — IOHEXOL 300 MG/ML  SOLN
75.0000 mL | Freq: Once | INTRAMUSCULAR | Status: AC | PRN
  Administered 2020-11-29: 75 mL via INTRAVENOUS

## 2020-11-29 MED ORDER — ONDANSETRON HCL 4 MG/2ML IJ SOLN
4.0000 mg | Freq: Once | INTRAMUSCULAR | Status: AC
  Administered 2020-11-29: 4 mg via INTRAVENOUS
  Filled 2020-11-29: qty 2

## 2020-11-29 MED ORDER — MORPHINE SULFATE (PF) 4 MG/ML IV SOLN
4.0000 mg | INTRAVENOUS | Status: DC | PRN
  Administered 2020-11-29 (×2): 4 mg via INTRAVENOUS
  Filled 2020-11-29 (×2): qty 1

## 2020-11-29 MED ORDER — SODIUM CHLORIDE 0.9 % IV BOLUS
500.0000 mL | Freq: Once | INTRAVENOUS | Status: AC
  Administered 2020-11-29: 500 mL via INTRAVENOUS

## 2020-11-29 MED ORDER — HYDROMORPHONE HCL 1 MG/ML IJ SOLN
0.5000 mg | Freq: Once | INTRAMUSCULAR | Status: AC
  Administered 2020-11-29: 0.5 mg via INTRAVENOUS
  Filled 2020-11-29: qty 1

## 2020-11-29 MED ORDER — SODIUM CHLORIDE 0.9 % IV SOLN: Freq: Once | INTRAVENOUS | Status: AC

## 2020-11-29 NOTE — ED Notes (Signed)
After multiple attempts report was given to Nigel Mormon, Charity fundraiser at [facility]

## 2020-11-29 NOTE — ED Notes (Signed)
Pt requested normal daily oxycodone for paint hat she usually takes at 0700 but missed it, MD aware and states pt NPO at this time, IV pain meds recommended, no new orders at this time.

## 2020-11-29 NOTE — ED Notes (Signed)
Patient states that her pain remains at an 8/10 complains of abdominal and left knee pain.  RN will notify provider patient states the pain med is now helping.

## 2020-11-29 NOTE — ED Notes (Signed)
Christine Reyes unable to obtained lab. Lab called at [phone]

## 2020-11-29 NOTE — ED Notes (Signed)
Pt at CT

## 2020-11-29 NOTE — ED Provider Notes (Signed)
[facility] Emergency Department Provider Note    Event Date/Time   First MD Initiated Contact with Patient 11/29/20 1004     (approximate)  I have reviewed the triage vital signs and the nursing notes.   HISTORY  Chief Complaint Knee Pain and Fever    HPI NATASCHA EDMONDS is a 65 y.o. senting with nausea vomiting for 24 hours not able to keep anything down.  Just recently had me surgery.  Describes mild abdominal discomfort.  Has not passed any gas to move her bowels today.  History of gastric bypass surgery at [facility].  Past Medical History:  Diagnosis Date   Anxiety    Arthritis    Headache    Hypertension    Iron deficiency anemia 06/17/2018   Obesity    Sleep apnea    Family History  Problem Relation Age of Onset   Hypertension Mother    Arthritis Mother    Cancer Father    Bipolar disorder Daughter    Breast cancer Neg Hx    Past Surgical History:  Procedure Laterality Date   ABDOMINAL HYSTERECTOMY     BREAST EXCISIONAL BIOPSY Right 1989   CHOLECYSTECTOMY     COLONOSCOPY WITH ESOPHAGOGASTRODUODENOSCOPY (EGD)     csection x2     DILATION AND CURETTAGE OF UTERUS     ESOPHAGOGASTRODUODENOSCOPY (EGD) WITH PROPOFOL N/A 12/14/2015   Procedure: ESOPHAGOGASTRODUODENOSCOPY (EGD) WITH PROPOFOL;  Surgeon: Manya Silvas, MD;  Location: [facility] ENDOSCOPY;  Service: Endoscopy;  Laterality: N/A;   GASTRIC BYPASS     JOINT REPLACEMENT     left knee surgery     TOTAL KNEE ARTHROPLASTY Right 06/22/2017   Procedure: TOTAL KNEE ARTHROPLASTY;  Surgeon: Lovell Sheehan, MD;  Location: [facility] ORS;  Service: Orthopedics;  Laterality: Right;   Patient Active Problem List   Diagnosis Date Noted   Evaluation by psychiatric service required 02/08/2019   Spinal stenosis, lumbar region, with neurogenic claudication 01/18/2019   Lumbar facet arthropathy 01/18/2019   Lumbar spondylosis 01/18/2019   Lumbar degenerative disc disease 01/18/2019   Primary  osteoarthritis of left knee 01/18/2019   Chronic pain syndrome 50/38/8828   Acute diastolic congestive heart failure ([place]) 01/18/2019   Iron deficiency anemia 06/17/2018   Status post bariatric surgery 03/31/2018   BMI 33.0-33.9,adult 03/31/2018   History of total knee arthroplasty 07/15/2017   History of total right knee replacement 06/22/2017   Gastroesophageal reflux disease without esophagitis 03/20/2016   Functional diarrhea 03/20/2016   Pleural effusion, right 03/19/2016   Morbid obesity ([place]) 03/06/2016   Hypertensive disorder 07/24/2015   Obstructive sleep apnea on CPAP 07/24/2015   Gallstone 07/24/2015   Primary osteoarthritis of both knees 07/04/2015   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   Arthritis, senescent 09/30/2013   Low back pain 09/30/2013      Prior to Admission medications   Medication Sig Start Date End Date Taking? Authorizing Provider  Aspirin-Salicylamide-Caffeine (ARTHRITIS STRENGTH BC POWDER PO) Take by mouth.    [provider]  dicyclomine (BENTYL) 20 MG tablet Take by mouth. 02/04/19 02/14/19  [provider]  gabapentin (NEURONTIN) 300 MG capsule Take 1 capsule (300 mg total) by mouth 2 (two) times daily. 01/18/19   Gillis Santa, MD  naloxone (NARCAN) 4 MG/0.1ML LIQD nasal spray kit Inhale 4 mg into the lungs as needed. 01/01/18   [provider]  olmesartan-hydrochlorothiazide (BENICAR HCT) 40-25 MG tablet Take 1 tablet by mouth daily. 05/20/17   [provider]  Oxycodone HCl 20 MG TABS Take 20 mg by mouth 2 (two) times daily as needed. 12/28/18   [provider]  Oxycodone HCl 20 MG TABS Take by mouth. 01/27/19   [provider]  pantoprazole (PROTONIX) 40 MG tablet Take 40 mg by mouth 2 (two) times daily as needed (heartburn).     [provider]    Allergies Morphine and related and Tramadol    Social History Social History   Tobacco Use   Smoking status: Former    Packs/day: 0.50     Types: Cigarettes    Quit date: 06/11/2015    Years since quitting: 5.4   Smokeless tobacco: Former  Scientific laboratory technician Use: Never used  Substance Use Topics   Alcohol use: Yes    Alcohol/week: 2.0 standard drinks    Types: 2 Glasses of wine per week   Drug use: No    Review of Systems Patient denies headaches, rhinorrhea, blurry vision, numbness, shortness of breath, chest pain, edema, cough, abdominal pain, nausea, vomiting, diarrhea, dysuria, fevers, rashes or hallucinations unless otherwise stated above in HPI. ____________________________________________   PHYSICAL EXAM:  VITAL SIGNS: Vitals:   11/29/20 1330 11/29/20 1500  BP: (!) 137/96 126/71  Pulse: (!) 117 (!) 107  Resp: 19 20  Temp:    SpO2: 100% 94%    Constitutional: Alert and oriented.  Eyes: Conjunctivae are normal.  Head: Atraumatic. Nose: No congestion/rhinnorhea. Mouth/Throat: Mucous membranes are moist.   Neck: No stridor. Painless ROM.  Cardiovascular: Normal rate, regular rhythm. Grossly normal heart sounds.  Good peripheral circulation. Respiratory: Normal respiratory effort.  No retractions. Lungs CTAB. Gastrointestinal: Soft and nontender. No distention. No abdominal bruits. No CVA tenderness. Genitourinary:  Musculoskeletal: No lower extremity tenderness nor edema.  No joint effusions. Neurologic:  Normal speech and language. No gross focal neurologic deficits are appreciated. No facial droop Skin:  Skin is warm, dry and intact. No rash noted. Psychiatric: Mood and affect are normal. Speech and behavior are normal.  ____________________________________________   LABS (all labs ordered are listed, but only abnormal results are displayed)  Results for orders placed or performed during the hospital encounter of 11/29/20 (from the past 24 hour(s))  Lactic acid, plasma     Status: Abnormal   Collection Time: 11/29/20 10:18 AM  Result Value Ref Range   Lactic Acid, Venous 2.6 (HH) 0.5 - 1.9  mmol/L  Comprehensive metabolic panel     Status: Abnormal   Collection Time: 11/29/20 10:18 AM  Result Value Ref Range   Sodium 138 135 - 145 mmol/L   Potassium 4.4 3.5 - 5.1 mmol/L   Chloride 100 98 - 111 mmol/L   CO2 26 22 - 32 mmol/L   Glucose, Bld 130 (H) 70 - 99 mg/dL   BUN 36 (H) 8 - 23 mg/dL   Creatinine, Ser 1.38 (H) 0.44 - 1.00 mg/dL   Calcium 8.6 (L) 8.9 - 10.3 mg/dL   Total Protein 7.2 6.5 - 8.1 g/dL   Albumin 3.2 (L) 3.5 - 5.0 g/dL   AST 19 15 - 41 U/L   ALT 10 0 - 44 U/L   Alkaline Phosphatase 103 38 - 126 U/L   Total Bilirubin 1.6 (H) 0.3 - 1.2 mg/dL   GFR, Estimated 43 (L) >60 mL/min   Anion gap 12 5 - 15  CBC with Differential     Status: Abnormal   Collection Time: 11/29/20 10:18 AM  Result Value Ref Range   WBC  16.4 (H) 4.0 - 10.5 K/uL   RBC 3.11 (L) 3.87 - 5.11 MIL/uL   Hemoglobin 9.6 (L) 12.0 - 15.0 g/dL   HCT 30.4 (L) 36.0 - 46.0 %   MCV 97.7 80.0 - 100.0 fL   MCH 30.9 26.0 - 34.0 pg   MCHC 31.6 30.0 - 36.0 g/dL   RDW 13.2 11.5 - 15.5 %   Platelets 443 (H) 150 - 400 K/uL   nRBC 0.0 0.0 - 0.2 %   Neutrophils Relative % 84 %   Neutro Abs 13.8 (H) 1.7 - 7.7 K/uL   Lymphocytes Relative 7 %   Lymphs Abs 1.2 0.7 - 4.0 K/uL   Monocytes Relative 8 %   Monocytes Absolute 1.3 (H) 0.1 - 1.0 K/uL   Eosinophils Relative 0 %   Eosinophils Absolute 0.0 0.0 - 0.5 K/uL   Basophils Relative 0 %   Basophils Absolute 0.1 0.0 - 0.1 K/uL   Immature Granulocytes 1 %   Abs Immature Granulocytes 0.09 (H) 0.00 - 0.07 K/uL  Resp Panel by RT-PCR (Flu A&B, Covid) Nasopharyngeal Swab     Status: None   Collection Time: 11/29/20 12:11 PM   Specimen: Nasopharyngeal Swab; Nasopharyngeal(NP) swabs in vial transport medium  Result Value Ref Range   SARS Coronavirus 2 by RT PCR NEGATIVE NEGATIVE   Influenza A by PCR NEGATIVE NEGATIVE   Influenza B by PCR NEGATIVE NEGATIVE  Urinalysis, Complete w Microscopic Urine, Clean Catch     Status: Abnormal   Collection Time: 11/29/20   1:40 PM  Result Value Ref Range   Color, Urine YELLOW (A) YELLOW   APPearance HAZY (A) CLEAR   Specific Gravity, Urine 1.036 (H) 1.005 - 1.030   pH 5.0 5.0 - 8.0   Glucose, UA NEGATIVE NEGATIVE mg/dL   Hgb urine dipstick NEGATIVE NEGATIVE   Bilirubin Urine NEGATIVE NEGATIVE   Ketones, ur NEGATIVE NEGATIVE mg/dL   Protein, ur NEGATIVE NEGATIVE mg/dL   Nitrite NEGATIVE NEGATIVE   Leukocytes,Ua NEGATIVE NEGATIVE   RBC / HPF 0-5 0 - 5 RBC/hpf   WBC, UA 0-5 0 - 5 WBC/hpf   Bacteria, UA NONE SEEN NONE SEEN   Squamous Epithelial / LPF 0-5 0 - 5   ____________________________________________  EKG____________________________________________  RADIOLOGY  I personally reviewed all radiographic images ordered to evaluate for the above acute complaints and reviewed radiology reports and findings.  These findings were personally discussed with the patient.  Please see medical record for radiology report.  ____________________________________________   PROCEDURES  Procedure(s) performed:  Procedures    Critical Care performed: no ____________________________________________   INITIAL IMPRESSION / ASSESSMENT AND PLAN / ED COURSE  Pertinent labs & imaging results that were available during my care of the patient were reviewed by me and considered in my medical decision making (see chart for details).   DDX: SBO, colitis, diverticulitis, postop infection  SHANICA CASTELLANOS is a 65 y.o. who presents to the ED with symptoms as described above.  Patient nontoxic-appearing does appear somewhat dehydrated will give IV fluids.  CT imaging ordered she is complaining of abdominal pain.  Left knee incision appears clean dry and intact no sign of infection no purulent drainage no overlying warmth.  Clinical Course as of 11/29/20 1531  Thu Nov 29, 2020  1315 Patient with evidence of small bowel obstruction by CT.  This was post gastric bypass obstruction discussed case with general surgery here who  has recommended evaluate for transfer to [facility] where she had surgery  performed.  Will give IV hydration reassess. [PR]  1527 Case discussed with Dr. Darnell Level who has accepted patient to his service.  [PR]    Clinical Course User Index [PR] Merlyn Lot, MD    The patient was evaluated in Emergency Department today for the symptoms described in the history of present illness. He/she was evaluated in the context of the global COVID-19 pandemic, which necessitated consideration that the patient might be at risk for infection with the SARS-CoV-2 virus that causes COVID-19. Institutional protocols and algorithms that pertain to the evaluation of patients at risk for COVID-19 are in a state of rapid change based on information released by regulatory bodies including the CDC and federal and state organizations. These policies and algorithms were followed during the patient's care in the ED.  As part of my medical decision making, I reviewed the following data within the [facility] notes reviewed and incorporated, Labs reviewed, notes from prior ED visits and  Controlled Substance Database   ____________________________________________   FINAL CLINICAL IMPRESSION(S) / ED DIAGNOSES  Final diagnoses:  Small bowel obstruction ([place])      NEW MEDICATIONS STARTED DURING THIS VISIT:  New Prescriptions   No medications on file     Note:  This document was prepared using Dragon voice recognition software and may include unintentional dictation errors.    Merlyn Lot, MD 11/29/20 [id number]

## 2020-11-29 NOTE — ED Triage Notes (Signed)
Pt arrives w/daughter. S/p L knee replacement on 7/14, arrives w/incr pain and swelling to knee. States temp of 101.8 last night

## 2020-11-29 NOTE — ED Notes (Signed)
Gave report to [facility] transport team. They will be here in about 90 minutes

## 2020-11-29 NOTE — ED Notes (Signed)
Lab called to run CBC

## 2020-12-10 ENCOUNTER — Other Ambulatory Visit: Payer: Self-pay | Admitting: Orthopedic Surgery

## 2020-12-10 DIAGNOSIS — Z96652 Presence of left artificial knee joint: Secondary | ICD-10-CM

## 2020-12-11 DIAGNOSIS — I10 Essential (primary) hypertension: Secondary | ICD-10-CM

## 2020-12-11 DIAGNOSIS — I82402 Acute embolism and thrombosis of unspecified deep veins of left lower extremity: Secondary | ICD-10-CM

## 2020-12-11 DIAGNOSIS — M1712 Unilateral primary osteoarthritis, left knee: Secondary | ICD-10-CM

## 2020-12-11 DIAGNOSIS — K56699 Other intestinal obstruction unspecified as to partial versus complete obstruction: Secondary | ICD-10-CM

## 2020-12-11 DIAGNOSIS — F1120 Opioid dependence, uncomplicated: Secondary | ICD-10-CM

## 2020-12-11 DIAGNOSIS — K219 Gastro-esophageal reflux disease without esophagitis: Secondary | ICD-10-CM

## 2020-12-27 ENCOUNTER — Other Ambulatory Visit: Payer: Self-pay | Admitting: Specialist

## 2020-12-27 DIAGNOSIS — R1084 Generalized abdominal pain: Secondary | ICD-10-CM

## 2021-01-07 ENCOUNTER — Other Ambulatory Visit: Payer: Self-pay

## 2021-01-07 DIAGNOSIS — D509 Iron deficiency anemia, unspecified: Secondary | ICD-10-CM

## 2021-01-09 ENCOUNTER — Other Ambulatory Visit: Payer: Self-pay

## 2021-01-09 ENCOUNTER — Emergency Department: Payer: Medicare Other

## 2021-01-09 ENCOUNTER — Encounter: Payer: Self-pay | Admitting: *Deleted

## 2021-01-09 DIAGNOSIS — R413 Other amnesia: Secondary | ICD-10-CM | POA: Insufficient documentation

## 2021-01-09 DIAGNOSIS — R791 Abnormal coagulation profile: Secondary | ICD-10-CM | POA: Diagnosis not present

## 2021-01-09 DIAGNOSIS — Z5321 Procedure and treatment not carried out due to patient leaving prior to being seen by health care provider: Secondary | ICD-10-CM | POA: Diagnosis not present

## 2021-01-09 DIAGNOSIS — R7309 Other abnormal glucose: Secondary | ICD-10-CM | POA: Insufficient documentation

## 2021-01-09 LAB — COMPREHENSIVE METABOLIC PANEL
ALT: 62 U/L — ABNORMAL HIGH (ref 0–44)
AST: 54 U/L — ABNORMAL HIGH (ref 15–41)
Albumin: 3.3 g/dL — ABNORMAL LOW (ref 3.5–5.0)
Alkaline Phosphatase: 163 U/L — ABNORMAL HIGH (ref 38–126)
Anion gap: 8 (ref 5–15)
BUN: 17 mg/dL (ref 8–23)
CO2: 24 mmol/L (ref 22–32)
Calcium: 8.5 mg/dL — ABNORMAL LOW (ref 8.9–10.3)
Chloride: 106 mmol/L (ref 98–111)
Creatinine, Ser: 1.28 mg/dL — ABNORMAL HIGH (ref 0.44–1.00)
GFR, Estimated: 47 mL/min — ABNORMAL LOW (ref >60)
Glucose, Bld: 79 mg/dL (ref 70–99)
Potassium: 4 mmol/L (ref 3.5–5.1)
Sodium: 138 mmol/L (ref 135–145)
Total Bilirubin: 0.6 mg/dL (ref 0.3–1.2)
Total Protein: 7.2 g/dL (ref 6.5–8.1)

## 2021-01-09 LAB — DIFFERENTIAL
Abs Immature Granulocytes: 0.03 10*3/uL (ref 0.00–0.07)
Basophils Absolute: 0.1 10*3/uL (ref 0.0–0.1)
Basophils Relative: 1 %
Eosinophils Absolute: 0.1 10*3/uL (ref 0.0–0.5)
Eosinophils Relative: 1 %
Immature Granulocytes: 0 %
Lymphocytes Relative: 37 %
Lymphs Abs: 2.9 10*3/uL (ref 0.7–4.0)
Monocytes Absolute: 0.7 10*3/uL (ref 0.1–1.0)
Monocytes Relative: 8 %
Neutro Abs: 4.2 10*3/uL (ref 1.7–7.7)
Neutrophils Relative %: 53 %

## 2021-01-09 LAB — CBC
HCT: 36.7 % (ref 36.0–46.0)
Hemoglobin: 11.8 g/dL — ABNORMAL LOW (ref 12.0–15.0)
MCH: 31.1 pg (ref 26.0–34.0)
MCHC: 32.2 g/dL (ref 30.0–36.0)
MCV: 96.6 fL (ref 80.0–100.0)
Platelets: 387 10*3/uL (ref 150–400)
RBC: 3.8 MIL/uL — ABNORMAL LOW (ref 3.87–5.11)
RDW: 14.7 % (ref 11.5–15.5)
WBC: 8 10*3/uL (ref 4.0–10.5)
nRBC: 0 % (ref 0.0–0.2)

## 2021-01-09 LAB — PROTIME-INR
INR: 1.2 (ref 0.8–1.2)
Prothrombin Time: 14.7 seconds (ref 11.4–15.2)

## 2021-01-09 LAB — APTT: aPTT: 29 seconds (ref 24–36)

## 2021-01-09 LAB — CBG MONITORING, ED: Glucose-Capillary: 80 mg/dL (ref 70–99)

## 2021-01-09 MED ORDER — SODIUM CHLORIDE 0.9% FLUSH: 3.0000 mL | Freq: Once | INTRAVENOUS | Status: DC

## 2021-01-09 NOTE — ED Triage Notes (Signed)
Pt daughter states around 2030 they were having a conversation about the weekend, and the pt was unable to recall the conversation and could not remember that she had knee surgery in July. Speech is clear, MAE x 4 equally, smile symmetrical.

## 2021-01-10 ENCOUNTER — Ambulatory Visit: Admission: RE | Admit: 2021-01-10 | Payer: Medicare Other | Source: Ambulatory Visit

## 2021-01-10 ENCOUNTER — Emergency Department
Admission: EM | Admit: 2021-01-10 | Discharge: 2021-01-10 | Disposition: A | Discharge status: Home / Self Care | Payer: Medicare Other | Attending: Emergency Medicine | Admitting: Emergency Medicine

## 2021-01-18 ENCOUNTER — Other Ambulatory Visit: Payer: Medicare Other

## 2021-01-21 ENCOUNTER — Ambulatory Visit: Payer: Medicare Other

## 2021-01-21 ENCOUNTER — Ambulatory Visit: Payer: Medicare Other | Admitting: Oncology

## 2021-02-04 ENCOUNTER — Inpatient Hospital Stay: Payer: Medicare Other | Attending: Oncology

## 2021-02-04 ENCOUNTER — Other Ambulatory Visit: Payer: Self-pay

## 2021-02-04 DIAGNOSIS — D509 Iron deficiency anemia, unspecified: Secondary | ICD-10-CM

## 2021-02-04 LAB — CBC WITH DIFFERENTIAL/PLATELET
Abs Immature Granulocytes: 0.03 10*3/uL (ref 0.00–0.07)
Basophils Absolute: 0 10*3/uL (ref 0.0–0.1)
Basophils Relative: 1 %
Eosinophils Absolute: 0.1 10*3/uL (ref 0.0–0.5)
Eosinophils Relative: 1 %
HCT: 33 % — ABNORMAL LOW (ref 36.0–46.0)
Hemoglobin: 10.7 g/dL — ABNORMAL LOW (ref 12.0–15.0)
Immature Granulocytes: 0 %
Lymphocytes Relative: 29 %
Lymphs Abs: 2.5 10*3/uL (ref 0.7–4.0)
MCH: 30.5 pg (ref 26.0–34.0)
MCHC: 32.4 g/dL (ref 30.0–36.0)
MCV: 94 fL (ref 80.0–100.0)
Monocytes Absolute: 0.7 10*3/uL (ref 0.1–1.0)
Monocytes Relative: 8 %
Neutro Abs: 5.2 10*3/uL (ref 1.7–7.7)
Neutrophils Relative %: 61 %
Platelets: 357 10*3/uL (ref 150–400)
RBC: 3.51 MIL/uL — ABNORMAL LOW (ref 3.87–5.11)
RDW: 15.1 % (ref 11.5–15.5)
WBC: 8.5 10*3/uL (ref 4.0–10.5)
nRBC: 0 % (ref 0.0–0.2)

## 2021-02-04 LAB — FOLATE: Folate: 8.6 ng/mL (ref >5.9)

## 2021-02-04 LAB — IRON AND TIBC
Iron: 67 ug/dL (ref 28–170)
Saturation Ratios: 22 % (ref 10.4–31.8)
TIBC: 301 ug/dL (ref 250–450)
UIBC: 234 ug/dL

## 2021-02-04 LAB — VITAMIN B12: Vitamin B-12: 874 pg/mL (ref 180–914)

## 2021-02-04 LAB — FERRITIN: Ferritin: 59 ng/mL (ref 11–307)

## 2021-02-05 ENCOUNTER — Inpatient Hospital Stay: Payer: Medicare Other

## 2021-02-05 ENCOUNTER — Inpatient Hospital Stay: Payer: Medicare Other | Admitting: Oncology

## 2021-02-19 ENCOUNTER — Encounter: Payer: Self-pay | Admitting: Oncology

## 2021-02-19 ENCOUNTER — Inpatient Hospital Stay: Payer: Medicare Other

## 2021-02-19 ENCOUNTER — Inpatient Hospital Stay: Payer: Medicare Other | Attending: Oncology | Admitting: Oncology

## 2021-02-19 VITALS — BP 128/83 | HR 69 | Resp 16

## 2021-02-19 VITALS — BP 145/90 | HR 79 | Temp 98.2°F | Wt 171.0 lb

## 2021-02-19 DIAGNOSIS — N1831 Chronic kidney disease, stage 3a: Secondary | ICD-10-CM | POA: Diagnosis not present

## 2021-02-19 DIAGNOSIS — Z9884 Bariatric surgery status: Secondary | ICD-10-CM

## 2021-02-19 DIAGNOSIS — D509 Iron deficiency anemia, unspecified: Secondary | ICD-10-CM

## 2021-02-19 MED ORDER — SODIUM CHLORIDE 0.9 % IV SOLN
Freq: Once | INTRAVENOUS | Status: AC
  Filled 2021-02-19: qty 250

## 2021-02-19 MED ORDER — IRON SUCROSE 20 MG/ML IV SOLN
200.0000 mg | Freq: Once | INTRAVENOUS | Status: AC
  Administered 2021-02-19: 200 mg via INTRAVENOUS
  Filled 2021-02-19: qty 10

## 2021-02-19 NOTE — Progress Notes (Signed)
Hematology/Oncology  [facility] Telephone:([phone] [phone]   Patient Care Team: Tracie Harrier, MD as PCP - General (Internal Medicine)  REFERRING PROVIDER: Tracie Harrier, MD  CHIEF COMPLAINTS/REASON FOR VISIT:  Follow-up of iron deficiency anemia  HISTORY OF PRESENTING ILLNESS:  Christine Reyes is a  65 y.o.  female with PMH listed below who was referred to me for evaluation of iron deficiency anemia Reviewed patient's recent labs that was done at [facility] office.  05/19/2018 labs revealed anemia with hemoglobin of 3.9, MCV 77.6.  Normal WBC.  Platelet count elevated at 468.  04/29/2018 ferritin 3, Reviewed patient's previous labs ordered by primary care physician's office,duration is since December 2019. No aggravating or improving factors.  Associated signs and symptoms: Patient reports fatigue, and feeling cold..  Denies SOB with exertion.  Denies weight loss, easy bruising, hematochezia, hemoptysis, hematuria. Context:  History of iron deficiency: Denies Rectal bleeding: Denies Menstrual bleeding/ Vaginal bleeding : Denies Hematemesis or hemoptysis : denies Blood in urine : denies  Pica: Eating ice chips. History of gastric bypass. Fatigue: Yes.  SOB: deneis  INTERVAL HISTORY Christine Reyes is a 65 y.o. female who has above history reviewed by me today presents for follow up visit for management of iron deficiency anemia, history of gastric bypass. Patient was last seen by me in September 2019, which is more than 3 years ago. Patient presents to reestablish care. She reports feeling tired and fatigued. She takes [facility] powder for chronic aches and pains. She denies any abdominal pain, change of bowel habits, black or bloody stool.  Review of Systems  Constitutional:  Positive for fatigue. Negative for appetite change, chills and fever.  HENT:   Negative for hearing loss and voice change.   Eyes:  Negative for eye problems.   Respiratory:  Negative for chest tightness and cough.   Cardiovascular:  Negative for chest pain.  Gastrointestinal:  Negative for abdominal distention, abdominal pain and blood in stool.  Endocrine: Negative for hot flashes.  Genitourinary:  Negative for difficulty urinating and frequency.   Musculoskeletal:  Negative for arthralgias.  Skin:  Negative for itching and rash.  Neurological:  Negative for extremity weakness.  Hematological:  Negative for adenopathy.  Psychiatric/Behavioral:  Negative for confusion.    MEDICAL HISTORY:  Past Medical History:  Diagnosis Date   Anxiety    Arthritis    Headache    Hypertension    Iron deficiency anemia 06/17/2018   Obesity    Sleep apnea     SURGICAL HISTORY: Past Surgical History:  Procedure Laterality Date   ABDOMINAL HYSTERECTOMY     BREAST EXCISIONAL BIOPSY Right 1989   CHOLECYSTECTOMY     COLONOSCOPY WITH ESOPHAGOGASTRODUODENOSCOPY (EGD)     csection x2     DILATION AND CURETTAGE OF UTERUS     ESOPHAGOGASTRODUODENOSCOPY (EGD) WITH PROPOFOL N/A 12/14/2015   Procedure: ESOPHAGOGASTRODUODENOSCOPY (EGD) WITH PROPOFOL;  Surgeon: Manya Silvas, MD;  Location: [facility];  Service: Endoscopy;  Laterality: N/A;   GASTRIC BYPASS     JOINT REPLACEMENT     left knee surgery     TOTAL KNEE ARTHROPLASTY Right 06/22/2017   Procedure: TOTAL KNEE ARTHROPLASTY;  Surgeon: Lovell Sheehan, MD;  Location: [facility];  Service: Orthopedics;  Laterality: Right;    SOCIAL HISTORY: Social History   Socioeconomic History   Marital status: Married    Spouse name: Not on file   Number of children: Not on file   Years of  education: Not on file   Highest education level: Not on file  Occupational History   Not on file  Tobacco Use   Smoking status: Former    Packs/day: 0.50    Types: Cigarettes    Quit date: 06/11/2015    Years since quitting: 5.6   Smokeless tobacco: Former  Scientific laboratory technician Use: Never used  Substance and Sexual  Activity   Alcohol use: Yes    Alcohol/week: 2.0 standard drinks    Types: 2 Glasses of wine per week   Drug use: No   Sexual activity: Not Currently    Birth control/protection: Surgical, Post-menopausal  Other Topics Concern   Not on file  Social History Narrative   Not on file   Social Determinants of Health   Financial Resource Strain: Not on file  Food Insecurity: Not on file  Transportation Needs: Not on file  Physical Activity: Not on file  Stress: Not on file  Social Connections: Not on file  Intimate Partner Violence: Not on file    FAMILY HISTORY: Family History  Problem Relation Age of Onset   Hypertension Mother    Arthritis Mother    Cancer Father    Bipolar disorder Daughter    Breast cancer Neg Hx     ALLERGIES:  is allergic to morphine and related and tramadol.  MEDICATIONS:  Current Outpatient Medications  Medication Sig Dispense Refill   gabapentin (NEURONTIN) 300 MG capsule Take 1 capsule (300 mg total) by mouth 2 (two) times daily. 60 capsule 2   naloxone (NARCAN) 4 MG/0.1ML LIQD nasal spray kit Inhale 4 mg into the lungs as needed.     olmesartan-hydrochlorothiazide (BENICAR HCT) 40-25 MG tablet Take 1 tablet by mouth daily.  5   Oxycodone HCl 20 MG TABS Take 20 mg by mouth 2 (two) times daily as needed.     pantoprazole (PROTONIX) 40 MG tablet Take 40 mg by mouth 2 (two) times daily as needed (heartburn).      Aspirin-Salicylamide-Caffeine (ARTHRITIS STRENGTH BC POWDER PO) Take by mouth. (Patient not taking: Reported on 02/19/2021)     dicyclomine (BENTYL) 20 MG tablet Take by mouth.     Oxycodone HCl 20 MG TABS Take by mouth. (Patient not taking: Reported on 02/19/2021)     No current facility-administered medications for this visit.     PHYSICAL EXAMINATION: ECOG PERFORMANCE STATUS: 1 - Symptomatic but completely ambulatory Vitals:   02/19/21 1308  BP: (!) 145/90  Pulse: 79  Temp: 98.2 F (36.8 C)   Filed Weights   02/19/21 1308   Weight: 171 lb (77.6 kg)    Physical Exam Constitutional:      General: She is not in acute distress. HENT:     Head: Normocephalic and atraumatic.  Eyes:     General: No scleral icterus.    Pupils: Pupils are equal, round, and reactive to light.  Cardiovascular:     Rate and Rhythm: Normal rate and regular rhythm.     Heart sounds: Normal heart sounds.  Pulmonary:     Effort: Pulmonary effort is normal. No respiratory distress.     Breath sounds: No wheezing.  Abdominal:     General: Bowel sounds are normal. There is no distension.     Palpations: Abdomen is soft. There is no mass.     Tenderness: There is no abdominal tenderness.  Musculoskeletal:        General: No deformity. Normal range of motion.  Cervical back: Normal range of motion and neck supple.  Skin:    General: Skin is warm and dry.     Findings: No erythema or rash.  Neurological:     Mental Status: She is alert and oriented to person, place, and time.     Cranial Nerves: No cranial nerve deficit.     Coordination: Coordination normal.  Psychiatric:        Behavior: Behavior normal.        Thought Content: Thought content normal.      CMP Latest Ref Rng & Units 01/09/2021  Glucose 70 - 99 mg/dL 79  BUN 8 - 23 mg/dL 17  Creatinine 0.44 - 1.00 mg/dL 1.28(H)  Sodium 135 - 145 mmol/L 138  Potassium 3.5 - 5.1 mmol/L 4.0  Chloride 98 - 111 mmol/L 106  CO2 22 - 32 mmol/L 24  Calcium 8.9 - 10.3 mg/dL 8.5(L)  Total Protein 6.5 - 8.1 g/dL 7.2  Total Bilirubin 0.3 - 1.2 mg/dL 0.6  Alkaline Phos 38 - 126 U/L 163(H)  AST 15 - 41 U/L 54(H)  ALT 0 - 44 U/L 62(H)   CBC Latest Ref Rng & Units 02/04/2021  WBC 4.0 - 10.5 K/uL 8.5  Hemoglobin 12.0 - 15.0 g/dL 10.7(L)  Hematocrit 36.0 - 46.0 % 33.0(L)  Platelets 150 - 400 K/uL 357     LABORATORY DATA:  I have reviewed the data as listed Lab Results  Component Value Date   WBC 8.5 02/04/2021   HGB 10.7 (L) 02/04/2021   HCT 33.0 (L) 02/04/2021   MCV  94.0 02/04/2021   PLT 357 02/04/2021   Recent Labs    11/29/20 1018 01/09/21 2135  NA 138 138  K 4.4 4.0  CL 100 106  CO2 26 24  GLUCOSE 130* 79  BUN 36* 17  CREATININE 1.38* 1.28*  CALCIUM 8.6* 8.5*  GFRNONAA 43* 47*  PROT 7.2 7.2  ALBUMIN 3.2* 3.3*  AST 19 54*  ALT 10 62*  ALKPHOS 103 163*  BILITOT 1.6* 0.6    Iron/TIBC/Ferritin/ %Sat    Component Value Date/Time   IRON 67 02/04/2021 1332   TIBC 301 02/04/2021 1332   FERRITIN 59 02/04/2021 1332   IRONPCTSAT 22 02/04/2021 1332     No results found.    ASSESSMENT & PLAN:  1. Iron deficiency anemia, unspecified iron deficiency anemia type   2. History of gastric bypass   3. Stage 3a chronic kidney disease (HCC)    #Iron deficiency anemia, anemia of chronic kidney disease Labs are reviewed and discussed with patient. Ferritin is 59, iron saturation 22.  Iron panel is not consistent with significant iron deficiency.  Hemoglobin 10.7, Patient has developed chronic kidney disease, 01/09/2021, her creatinine was 1.28 with estimated GFR 47. In the context of chronic kidney disease, I recommend patient to further improve her iron store. She has previously received IV Venofer treatments in 2020 and tolerated well. Recommend patient to proceed with IV Venofer weekly x3.  #Chronic kidney disease, recommend patient to avoid NSAID use.  Encourage oral hydration. Check multiple myeloma work up  [employer] of gastric bypass.  At risk of vitamin deficiency due to malabsorption. Folic acid deficiency, recommend patient to start folic acid supplementation. I will check vitamin B12 and folate level at next visit.   Orders Placed This Encounter  Procedures   CBC with Differential/Platelet    Standing Status:   Future    Standing Expiration Date:   02/19/2022   Comprehensive  metabolic panel    Standing Status:   Future    Standing Expiration Date:   02/19/2022   Ferritin    Standing Status:   Future    Standing Expiration  Date:   02/19/2022   Iron and TIBC    Standing Status:   Future    Standing Expiration Date:   02/19/2022   Retic Panel    Standing Status:   Future    Standing Expiration Date:   02/19/2022   Kappa/lambda light chains    Standing Status:   Future    Standing Expiration Date:   02/19/2022   Multiple Myeloma Panel (SPEP&IFE w/QIG)    Standing Status:   Future    Standing Expiration Date:   02/19/2022    All questions were answered. The patient knows to call the clinic with any problems questions or concerns.  Return of visit: 12 weeks.     Earlie Server, MD, PhD  02/19/2021

## 2021-02-19 NOTE — Patient Instructions (Signed)

## 2021-02-26 ENCOUNTER — Inpatient Hospital Stay: Payer: Medicare Other

## 2021-02-26 ENCOUNTER — Other Ambulatory Visit: Payer: Self-pay

## 2021-02-26 VITALS — BP 127/83 | HR 80 | Temp 96.9°F | Resp 16

## 2021-02-26 DIAGNOSIS — D509 Iron deficiency anemia, unspecified: Secondary | ICD-10-CM

## 2021-02-26 MED ORDER — IRON SUCROSE 20 MG/ML IV SOLN: 200.0000 mg | Freq: Once | INTRAVENOUS | Status: DC

## 2021-02-26 NOTE — Progress Notes (Signed)
Neither myself nor K.Hinson charge nurse were unable to obtain PIV access. Patient will push PO fluids and we will attempt Thursday afternoon. Rescheduled, new appt provided.

## 2021-02-28 ENCOUNTER — Inpatient Hospital Stay: Payer: Medicare Other

## 2021-03-05 ENCOUNTER — Inpatient Hospital Stay: Payer: Medicare Other

## 2021-03-05 ENCOUNTER — Other Ambulatory Visit: Payer: Self-pay | Admitting: Oncology

## 2021-03-05 ENCOUNTER — Other Ambulatory Visit: Payer: Self-pay

## 2021-03-05 MED ORDER — IRON-VITAMIN C 65-125 MG PO TABS: 1.0000 | ORAL_TABLET | Freq: Every day | ORAL | 3 refills | Status: AC

## 2021-03-05 NOTE — Patient Instructions (Signed)
C[facility]ER CENTER [place] [facility]  Discharge Instructions: Thank you for choosing [facility] to provide your oncology and hematology care.  If you have a lab appointment with the Cancer Center, please go directly to the Cancer Center and check in at the registration area.  Wear comfortable clothing and clothing appropriate for easy access to any Portacath or PICC line.   We strive to give you quality time with your provider. You may need to reschedule your appointment if you arrive late (15 or more minutes).  Arriving late affects you and other patients whose appointments are after yours.  Also, if you miss three or more appointments without notifying the office, you may be dismissed from the clinic at the provider's discretion.      For prescription refill requests, have your pharmacy contact our office and allow 72 hours for refills to be completed.    Today you received the following chemotherapy and/or immunotherapy agents VENOFER      To help prevent nausea and vomiting after your treatment, we encourage you to take your nausea medication as directed.  BELOW ARE SYMPTOMS THAT SHOULD BE REPORTED IMMEDIATELY: *FEVER GREATER THAN 100.4 F (38 C) OR HIGHER *CHILLS OR SWEATING *NAUSEA AND VOMITING THAT IS NOT CONTROLLED WITH YOUR NAUSEA MEDICATION *UNUSUAL SHORTNESS OF BREATH *UNUSUAL BRUISING OR BLEEDING *URINARY PROBLEMS (pain or burning when urinating, or frequent urination) *BOWEL PROBLEMS (unusual diarrhea, constipation, pain near the anus) TENDERNESS IN MOUTH AND THROAT WITH OR WITHOUT PRESENCE OF ULCERS (sore throat, sores in mouth, or a toothache) UNUSUAL RASH, SWELLING OR PAIN  UNUSUAL VAGINAL DISCHARGE OR ITCHING   Items with * indicate a potential emergency and should be followed up as soon as possible or go to the Emergency Department if any problems should occur.  Please show the CHEMOTHERAPY ALERT CARD or IMMUNOTHERAPY ALERT CARD at check-in to  the Emergency Department and triage nurse.  Should you have questions after your visit or need to cancel or reschedule your appointment, please contact [facility] [place] [facility]  [phone] and follow the prompts.  Office hours are 8:00 a.m. to 4:30 p.m. Monday - Friday. Please note that voicemails left after 4:00 p.m. may not be returned until the following business day.  We are closed weekends and major holidays. You have access to a nurse at all times for urgent questions. Please call the main number to the clinic [phone] and follow the prompts.  For any non-urgent questions, you may also contact your provider using MyChart. We now offer e-Visits for anyone 18 and older to request care online for non-urgent symptoms. For details visit [email or web address].   Also download the MyChart app! Go to the app store, search "MyChart", open the app, select [facility], and log in with your MyChart username and password.  Due to Covid, a mask is required upon entering the hospital/clinic. If you do not have a mask, one will be given to you upon arrival. For doctor visits, patients may have 1 support person aged 18 or older with them. For treatment visits, patients cannot have anyone with them due to current Covid guidelines and our immunocompromised population.   Iron Sucrose Injection What is this medication? IRON SUCROSE (EYE ern SOO krose) treats low levels of iron (iron deficiency anemia) in people with kidney disease. Iron is a mineral that plays an important role in making red blood cells, which carry oxygen from your lungs to the rest of your body. This medicine may   be used for other purposes; ask your health care provider or pharmacist if you have questions. COMMON BRAND NAME(S): Venofer What should I tell my care team before I take this medication? They need to know if you have any of these conditions: Anemia not caused by low iron levels Heart disease High levels  of iron in the blood Kidney disease Liver disease An unusual or allergic reaction to iron, other medications, foods, dyes, or preservatives Pregnant or trying to get pregnant Breast-feeding How should I use this medication? This medication is for infusion into a vein. It is given in a hospital or clinic setting. Talk to your care team about the use of this medication in children. While this medication may be prescribed for children as young as 2 years for selected conditions, precautions do apply. Overdosage: If you think you have taken too much of this medicine contact a poison control center or emergency room at once. NOTE: This medicine is only for you. Do not share this medicine with others. What if I miss a dose? It is important not to miss your dose. Call your care team if you are unable to keep an appointment. What may interact with this medication? Do not take this medication with any of the following: Deferoxamine Dimercaprol Other iron products This medication may also interact with the following: Chloramphenicol Deferasirox This list may not describe all possible interactions. Give your health care provider a list of all the medicines, herbs, non-prescription drugs, or dietary supplements you use. Also tell them if you smoke, drink alcohol, or use illegal drugs. Some items may interact with your medicine. What should I watch for while using this medication? Visit your care team regularly. Tell your care team if your symptoms do not start to get better or if they get worse. You may need blood work done while you are taking this medication. You may need to follow a special diet. Talk to your care team. Foods that contain iron include: whole grains/cereals, dried fruits, beans, or peas, leafy green vegetables, and organ meats (liver, kidney). What side effects may I notice from receiving this medication? Side effects that you should report to your care team as soon as  possible: Allergic reactions-skin rash, itching, hives, swelling of the face, lips, tongue, or throat Low blood pressure-dizziness, feeling faint or lightheaded, blurry vision Shortness of breath Side effects that usually do not require medical attention (report to your care team if they continue or are bothersome): Flushing Headache Joint pain Muscle pain Nausea Pain, redness, or irritation at injection site This list may not describe all possible side effects. Call your doctor for medical advice about side effects. You may report side effects to FDA at 1-800-FDA-1088. Where should I keep my medication? This medication is given in a hospital or clinic and will not be stored at home. NOTE: This sheet is a summary. It may not cover all possible information. If you have questions about this medicine, talk to your doctor, pharmacist, or health care provider.  2022 Elsevier/Gold Standard (2020-07-24 12:52:06)  

## 2021-03-05 NOTE — Progress Notes (Signed)
Christine Reyes

## 2021-03-05 NOTE — Progress Notes (Signed)
Numerous sticks by myself and K Hinson. Unable to obtain PIV access. Dr Cathie Hoops aware. Pt to take PO iron. Pt verbalized understanding. Emotional support given.

## 2021-03-08 ENCOUNTER — Inpatient Hospital Stay: Payer: Medicare Other

## 2021-05-15 ENCOUNTER — Encounter: Payer: Self-pay | Admitting: Oncology

## 2021-05-16 ENCOUNTER — Other Ambulatory Visit: Payer: Self-pay | Admitting: *Deleted

## 2021-05-16 DIAGNOSIS — D509 Iron deficiency anemia, unspecified: Secondary | ICD-10-CM

## 2021-05-22 ENCOUNTER — Inpatient Hospital Stay: Payer: Medicare Other | Attending: Oncology

## 2021-05-23 ENCOUNTER — Encounter: Payer: Self-pay | Admitting: Oncology

## 2021-05-27 ENCOUNTER — Telehealth: Payer: Self-pay

## 2021-05-27 NOTE — Telephone Encounter (Signed)
Pt scheduled for MD/ venofer on 1/18, but did not show for labs (1 week prior). Please call pt to r/s all appts- next avail.

## 2021-05-28 ENCOUNTER — Encounter: Payer: Self-pay | Admitting: Oncology

## 2021-05-29 ENCOUNTER — Inpatient Hospital Stay: Payer: Medicare Other

## 2021-05-29 ENCOUNTER — Inpatient Hospital Stay: Payer: Medicare Other | Admitting: Oncology

## 2021-06-13 ENCOUNTER — Encounter: Payer: Self-pay | Admitting: Oncology

## 2021-06-18 ENCOUNTER — Telehealth: Payer: Self-pay | Admitting: Oncology

## 2021-06-18 NOTE — Telephone Encounter (Signed)
Received secure that regarding a VM that was left by patient stating she had questions about her appts. I attempted to call the pt back, I left her VM to call back or reach out via MyChart.

## 2021-06-20 ENCOUNTER — Inpatient Hospital Stay: Payer: Medicare Other | Attending: Oncology

## 2021-06-27 ENCOUNTER — Inpatient Hospital Stay: Payer: Medicare Other

## 2021-06-27 ENCOUNTER — Inpatient Hospital Stay: Payer: Medicare Other | Admitting: Oncology

## 2021-09-25 ENCOUNTER — Other Ambulatory Visit: Payer: Self-pay | Admitting: Internal Medicine

## 2021-09-25 DIAGNOSIS — Z1231 Encounter for screening mammogram for malignant neoplasm of breast: Secondary | ICD-10-CM

## 2021-10-01 IMAGING — CT CT HEAD W/O CM
3 series · 16 of 47 positions shown, 19 images · non-contrast
Comparison: None.

CLINICAL DATA: Altered mental status.

EXAM:
CT HEAD WITHOUT CONTRAST
TECHNIQUE: Contiguous axial images were obtained from the base of the skull
through the vertex without intravenous contrast.

[Series 3: head wo · axial · 0.39mm/px · z∈[-148,-23]mm · 10 of 30 slices shown, 13 images]
[im 3/30  brain]
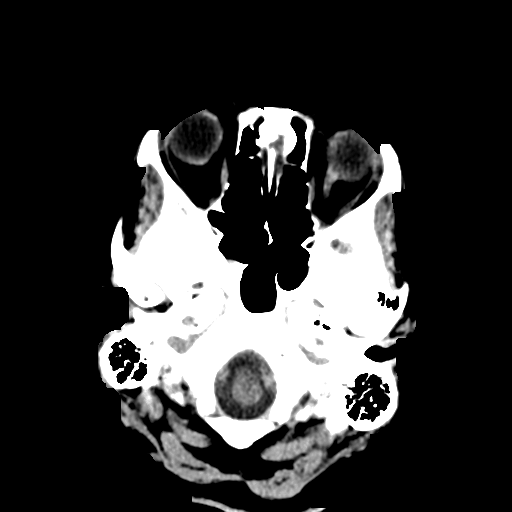
[im 3/30  bone]
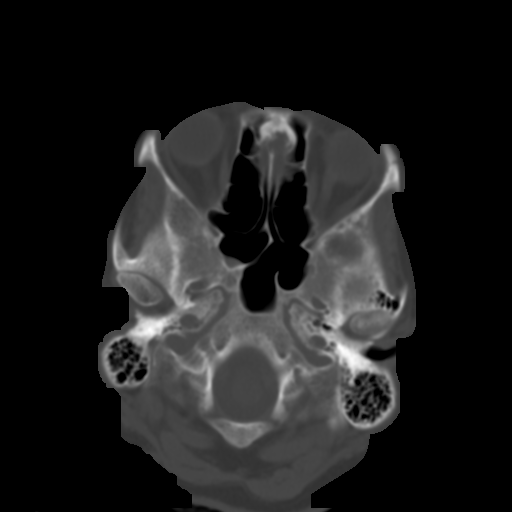
[im 6/30  brain]
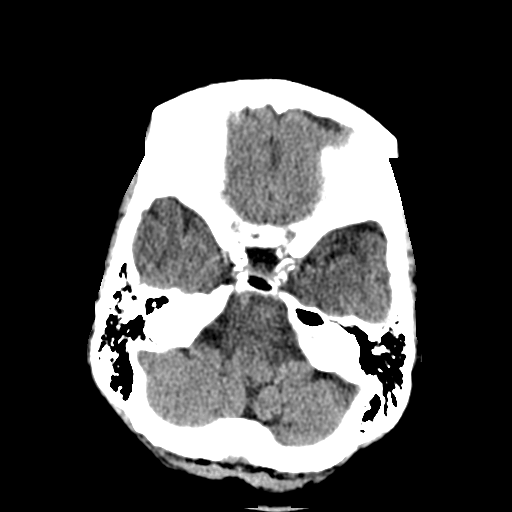
[im 9/30  brain]
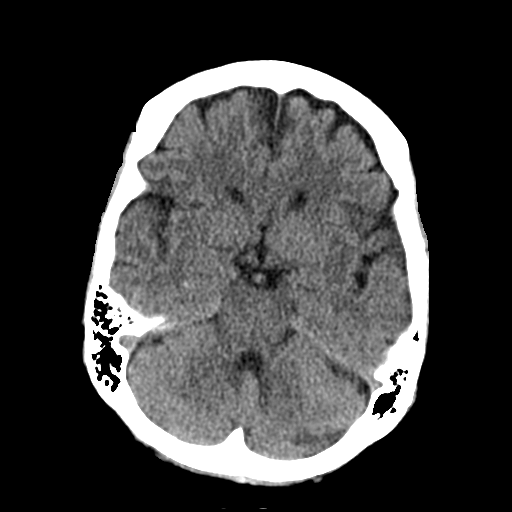
[im 11/30  brain]
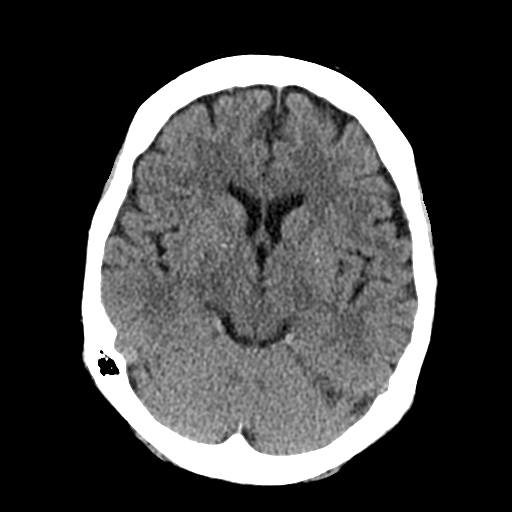
[im 14/30  brain]
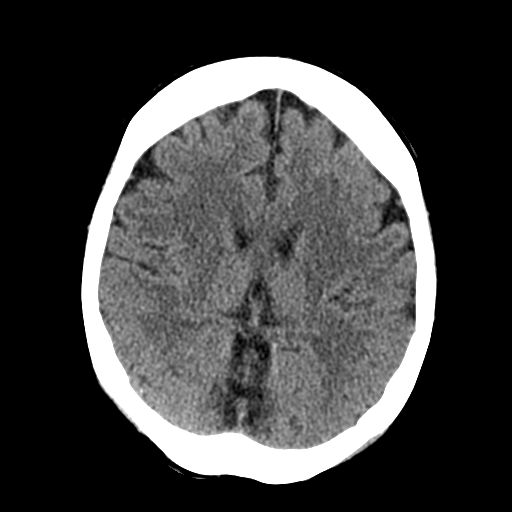
[im 14/30  bone]
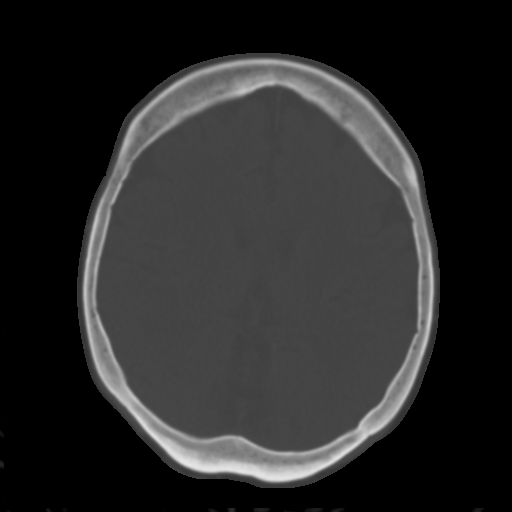
[im 17/30  brain]
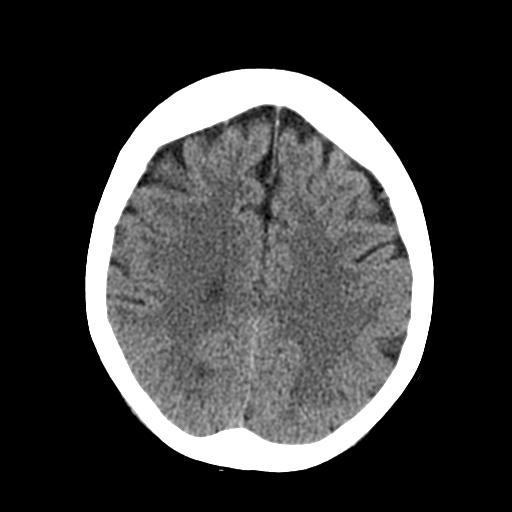
[im 20/30  brain]
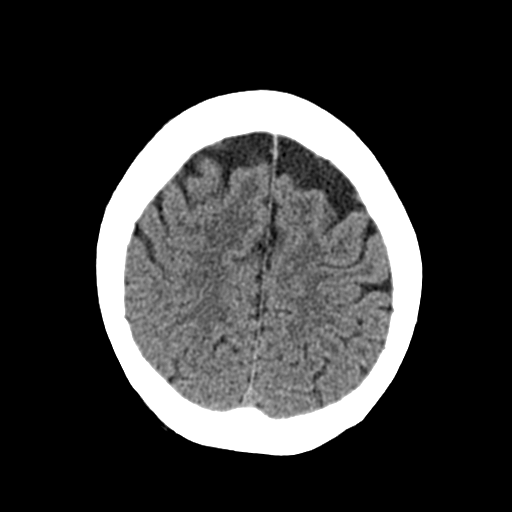
[im 23/30  brain]
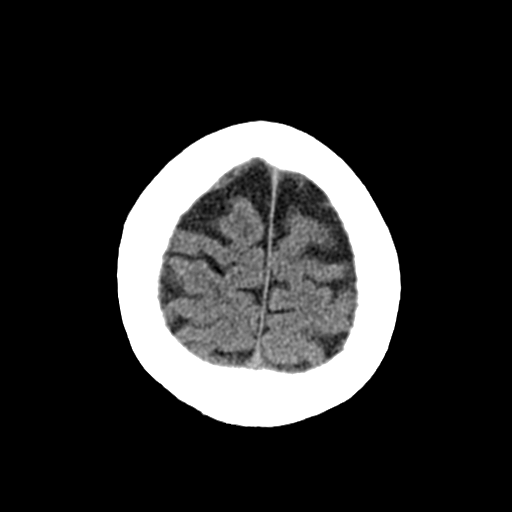
[im 25/30  brain]
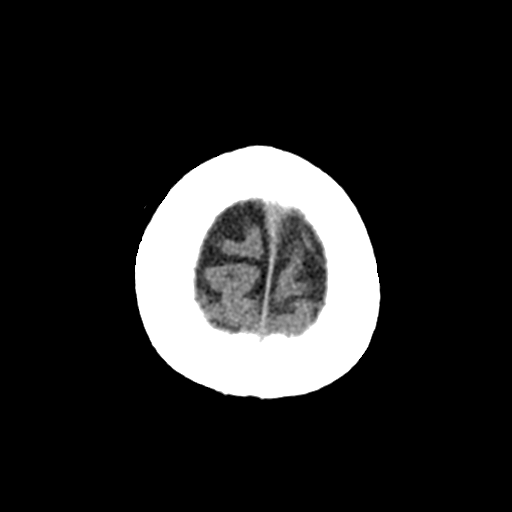
[im 25/30  bone]
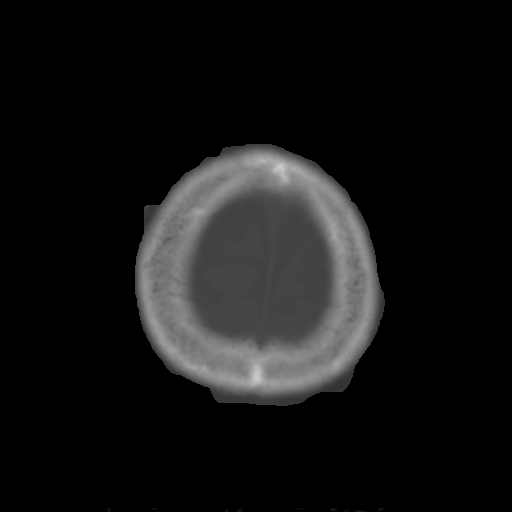
[im 28/30  brain]
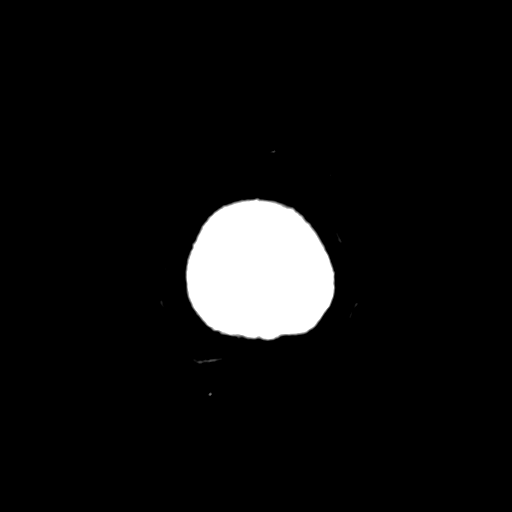

[Series 4: coronal soft tissue · coronal · 0.30mm/px · 3 of 68 slices shown]
[im 23/68  brain]
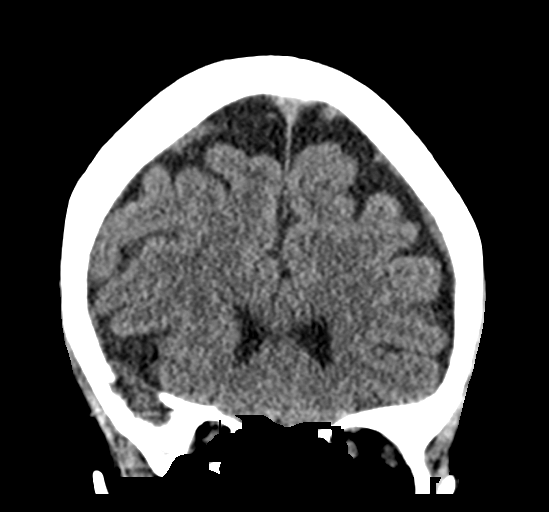
[im 30/68  brain]
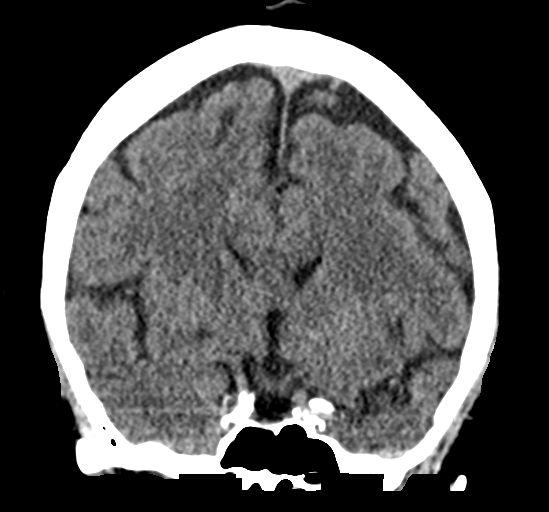
[im 38/68  brain]
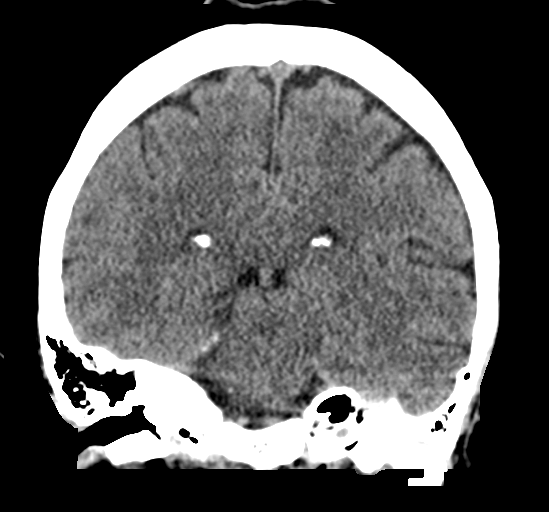

[Series 5: sagittal soft tissue · sagittal · 0.30mm/px · 3 of 59 slices shown]
[im 20/59  brain]
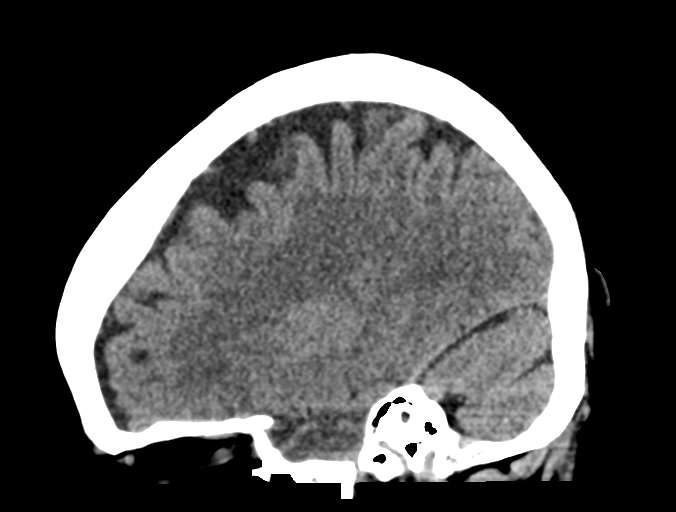
[im 30/59  brain]
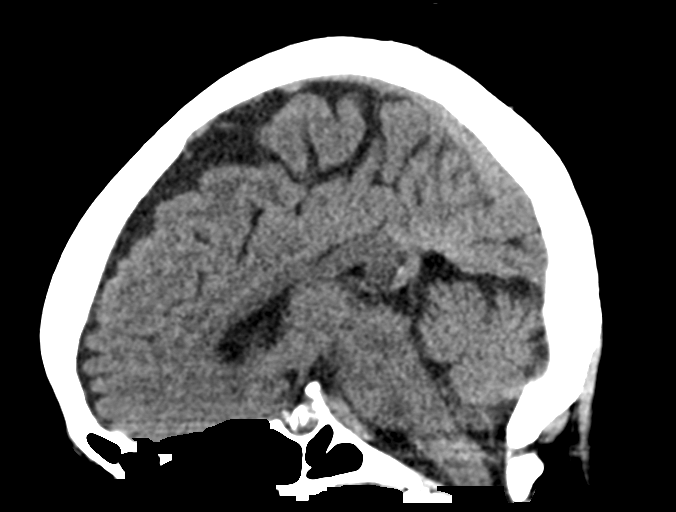
[im 39/59  brain]
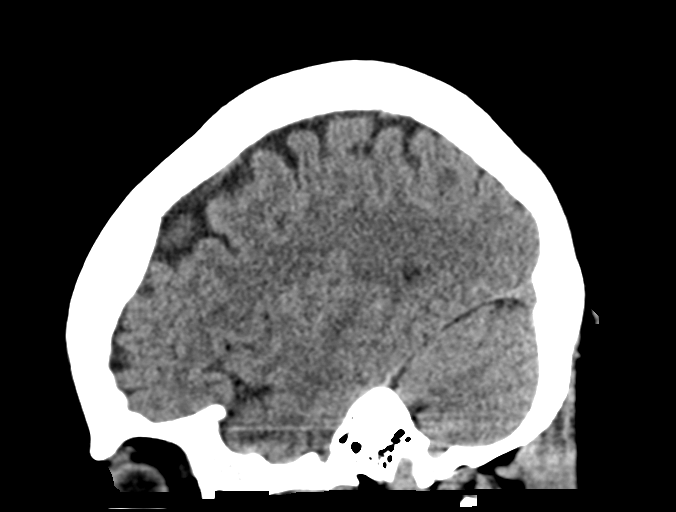

[16 of 47 positions shown; findings below may reference images not displayed]

FINDINGS: Brain: There is mild cerebral atrophy with widening of the
extra-axial spaces and ventricular dilatation.
There are areas of decreased attenuation within the white matter
tracts of the supratentorial brain, consistent with microvascular
disease changes.

Vascular: No hyperdense vessel or unexpected calcification.

Skull: Normal. Negative for fracture or focal lesion.

Sinuses/Orbits: No acute finding.

Other: None.
IMPRESSION: 1. Generalized cerebral atrophy.
2. No acute intracranial abnormality.

## 2021-10-02 ENCOUNTER — Encounter: Payer: Self-pay | Admitting: Oncology

## 2021-11-19 ENCOUNTER — Ambulatory Visit
Admission: RE | Admit: 2021-11-19 | Discharge: 2021-11-19 | Disposition: A | Discharge status: Home / Self Care | Payer: Medicare Other | Source: Ambulatory Visit | Attending: Internal Medicine | Admitting: Internal Medicine

## 2021-11-19 DIAGNOSIS — Z1231 Encounter for screening mammogram for malignant neoplasm of breast: Secondary | ICD-10-CM | POA: Insufficient documentation

## 2022-08-07 ENCOUNTER — Other Ambulatory Visit: Payer: Self-pay

## 2022-08-07 ENCOUNTER — Emergency Department: Payer: 59

## 2022-08-07 ENCOUNTER — Encounter: Payer: Self-pay | Admitting: Oncology

## 2022-08-07 ENCOUNTER — Emergency Department
Admission: EM | Admit: 2022-08-07 | Discharge: 2022-08-07 | Disposition: A | Discharge status: Home / Self Care | Payer: 59 | Attending: Emergency Medicine | Admitting: Emergency Medicine

## 2022-08-07 DIAGNOSIS — I509 Heart failure, unspecified: Secondary | ICD-10-CM | POA: Insufficient documentation

## 2022-08-07 DIAGNOSIS — K589 Irritable bowel syndrome without diarrhea: Secondary | ICD-10-CM | POA: Insufficient documentation

## 2022-08-07 DIAGNOSIS — R109 Unspecified abdominal pain: Secondary | ICD-10-CM | POA: Diagnosis present

## 2022-08-07 LAB — COMPREHENSIVE METABOLIC PANEL
ALT: 11 U/L (ref 0–44)
AST: 14 U/L — ABNORMAL LOW (ref 15–41)
Albumin: 3.5 g/dL (ref 3.5–5.0)
Alkaline Phosphatase: 115 U/L (ref 38–126)
Anion gap: 10 (ref 5–15)
BUN: 17 mg/dL (ref 8–23)
CO2: 21 mmol/L — ABNORMAL LOW (ref 22–32)
Calcium: 8.4 mg/dL — ABNORMAL LOW (ref 8.9–10.3)
Chloride: 112 mmol/L — ABNORMAL HIGH (ref 98–111)
Creatinine, Ser: 0.92 mg/dL (ref 0.44–1.00)
GFR, Estimated: >60 mL/min (ref >60)
Glucose, Bld: 76 mg/dL (ref 70–99)
Potassium: 3.6 mmol/L (ref 3.5–5.1)
Sodium: 143 mmol/L (ref 135–145)
Total Bilirubin: 0.5 mg/dL (ref 0.3–1.2)
Total Protein: 7.2 g/dL (ref 6.5–8.1)

## 2022-08-07 LAB — URINALYSIS, ROUTINE W REFLEX MICROSCOPIC
Bilirubin Urine: NEGATIVE
Glucose, UA: NEGATIVE mg/dL
Hgb urine dipstick: NEGATIVE
Ketones, ur: NEGATIVE mg/dL
Leukocytes,Ua: NEGATIVE
Nitrite: NEGATIVE
Protein, ur: NEGATIVE mg/dL
Specific Gravity, Urine: 1.027 (ref 1.005–1.030)
pH: 5 (ref 5.0–8.0)

## 2022-08-07 LAB — CBC
HCT: 38 % (ref 36.0–46.0)
Hemoglobin: 11.9 g/dL — ABNORMAL LOW (ref 12.0–15.0)
MCH: 31.2 pg (ref 26.0–34.0)
MCHC: 31.3 g/dL (ref 30.0–36.0)
MCV: 99.5 fL (ref 80.0–100.0)
Platelets: 262 10*3/uL (ref 150–400)
RBC: 3.82 MIL/uL — ABNORMAL LOW (ref 3.87–5.11)
RDW: 13.6 % (ref 11.5–15.5)
WBC: 6.2 10*3/uL (ref 4.0–10.5)
nRBC: 0 % (ref 0.0–0.2)

## 2022-08-07 LAB — LIPASE, BLOOD: Lipase: 29 U/L (ref 11–51)

## 2022-08-07 MED ORDER — ONDANSETRON 4 MG PO TBDP
4.0000 mg | ORAL_TABLET | Freq: Once | ORAL | Status: AC
  Administered 2022-08-07: 4 mg via ORAL
  Filled 2022-08-07: qty 1

## 2022-08-07 MED ORDER — DICYCLOMINE HCL 20 MG PO TABS: 20.0000 mg | ORAL_TABLET | Freq: Three times a day (TID) | ORAL | 0 refills | Status: DC

## 2022-08-07 MED ORDER — SODIUM CHLORIDE 0.9 % IV BOLUS: 1000.0000 mL | Freq: Once | INTRAVENOUS | Status: DC

## 2022-08-07 MED ORDER — HYDROMORPHONE HCL 1 MG/ML IJ SOLN
0.5000 mg | Freq: Once | INTRAMUSCULAR | Status: DC
  Filled 2022-08-07: qty 0.5

## 2022-08-07 MED ORDER — ONDANSETRON 4 MG PO TBDP: 4.0000 mg | ORAL_TABLET | Freq: Three times a day (TID) | ORAL | 0 refills | Status: DC | PRN

## 2022-08-07 MED ORDER — IBUPROFEN 600 MG PO TABS: 600.0000 mg | ORAL_TABLET | Freq: Three times a day (TID) | ORAL | 0 refills | Status: DC | PRN

## 2022-08-07 MED ORDER — ONDANSETRON HCL 4 MG/2ML IJ SOLN
4.0000 mg | Freq: Once | INTRAMUSCULAR | Status: DC
  Filled 2022-08-07: qty 2

## 2022-08-07 NOTE — ED Triage Notes (Signed)
Pt to ED POV for lower abd pain that started Saturday.  States was having diarrhea but has been treated. Was sent by Dr Ginette Pitman for CT  NAD noted

## 2022-08-07 NOTE — ED Provider Notes (Signed)
[facility] Provider Note    Event Date/Time   First MD Initiated Contact with Patient 08/07/22 1242     (approximate)   History   Abdominal Pain   HPI  Christine Reyes is a 67 y.o. female here with abdominal pain.  The patient states that over the last several days she has had persistently worsening, generalized, but primarily epigastric and right lower quadrant abdominal pain.  The pain is aching and throbbing.  It is constant.  It is worse with movement and palpation.  No alleviating factors.     Physical Exam   Triage Vital Signs: ED Triage Vitals  Enc Vitals Group     BP 08/07/22 1221 127/77     Pulse Rate 08/07/22 1221 72     Resp 08/07/22 1221 16     Temp 08/07/22 1220 98.6 F (37 C)     Temp Source 08/07/22 1220 Oral     SpO2 08/07/22 1221 99 %     Weight 08/07/22 1220 169 lb (76.7 kg)     Height 08/07/22 1220 4\' 11"  (1.499 m)     Head Circumference --      Peak Flow --      Pain Score 08/07/22 1220 7     Pain Loc --      Pain Edu? --      Excl. in [place]? --     Most recent vital signs: Vitals:   08/07/22 1220 08/07/22 1221  BP:  127/77  Pulse:  72  Resp:  16  Temp: 98.6 F (37 C)   SpO2:  99%     General: Awake, no distress.  CV:  Good peripheral perfusion.  Resp:  Normal work of breathing.  Lungs clear. Abd:  No distention.  Mild diffuse tenderness, particularly in the lower abdomen.  No rigidity. Other:  Moist mucous membranes.   ED Results / Procedures / Treatments   Labs (all labs ordered are listed, but only abnormal results are displayed) Labs Reviewed  COMPREHENSIVE METABOLIC PANEL - Abnormal; Notable for the following components:      Result Value   Chloride 112 (*)    CO2 21 (*)    Calcium 8.4 (*)    AST 14 (*)    All other components within normal limits  CBC - Abnormal; Notable for the following components:   RBC 3.82 (*)    Hemoglobin 11.9 (*)    All other components within normal limits   URINALYSIS, ROUTINE W REFLEX MICROSCOPIC - Abnormal; Notable for the following components:   Color, Urine YELLOW (*)    APPearance HAZY (*)    All other components within normal limits  LIPASE, BLOOD     EKG    RADIOLOGY CT A/P: Diverticulosis, otherwise no acute abnormality   I also independently reviewed and agree with radiologist interpretations.   PROCEDURES:  Critical Care performed: No   MEDICATIONS ORDERED IN ED: Medications  sodium chloride 0.9 % bolus 1,000 mL (1,000 mLs Intravenous Not Given 08/07/22 1355)  ondansetron (ZOFRAN) injection 4 mg (4 mg Intravenous Not Given 08/07/22 1355)  HYDROmorphone (DILAUDID) injection 0.5 mg (0.5 mg Intravenous Not Given 08/07/22 1355)  ondansetron (ZOFRAN-ODT) disintegrating tablet 4 mg (4 mg Oral Given 08/07/22 1401)     IMPRESSION / MDM / ASSESSMENT AND PLAN / ED COURSE  I reviewed the triage vital signs and the nursing notes.  Differential diagnosis includes, but is not limited to, diverticulitis, IBS, constipation/ileus, obstruction, gastritis, enteritis, medication adverse effect, viral illness  Patient's presentation is most consistent with acute presentation with potential threat to life or bodily function.  The patient is on the cardiac monitor to evaluate for evidence of arrhythmia and/or significant heart rate changes  67 yo F with PMHx CHF, chronic pain, bariatric surgery, functional diarrhea/IBS, here with abdominal pain. Suspect recurrent IBS, possibly resolved diverticulitis now with some ongoing mild pain. CT reviewed and is unremarkable.  CBC shows no leukocytosis or anemia. CMP unremarkable. UA negative.  Abdomen soft. No apparent emergent pathology. VSS. Will d/c with bentyl, zofran, outpt follow-up.   FINAL CLINICAL IMPRESSION(S) / ED DIAGNOSES   Final diagnoses:  Irritable bowel syndrome, unspecified type     Rx / DC Orders   ED Discharge Orders          Ordered     dicyclomine (BENTYL) 20 MG tablet  3 times daily before meals & bedtime        08/07/22 1529    ondansetron (ZOFRAN-ODT) 4 MG disintegrating tablet  Every 8 hours PRN        08/07/22 1529    ibuprofen (ADVIL) 600 MG tablet  Every 8 hours PRN        08/07/22 1529             Note:  This document was prepared using Dragon voice recognition software and may include unintentional dictation errors.   Duffy Bruce, MD 08/07/22 [phone]

## 2023-01-08 ENCOUNTER — Other Ambulatory Visit: Payer: Self-pay | Admitting: Internal Medicine

## 2023-01-08 DIAGNOSIS — Z1231 Encounter for screening mammogram for malignant neoplasm of breast: Secondary | ICD-10-CM

## 2023-02-25 ENCOUNTER — Ambulatory Visit
Admission: RE | Admit: 2023-02-25 | Discharge: 2023-02-25 | Disposition: A | Discharge status: Home / Self Care | Payer: 59 | Source: Ambulatory Visit | Attending: Internal Medicine | Admitting: Internal Medicine

## 2023-02-25 DIAGNOSIS — Z1231 Encounter for screening mammogram for malignant neoplasm of breast: Secondary | ICD-10-CM | POA: Diagnosis present

## 2023-04-12 DIAGNOSIS — I639 Cerebral infarction, unspecified: Secondary | ICD-10-CM

## 2023-04-12 HISTORY — DX: Cerebral infarction, unspecified: I63.9

## 2023-05-08 ENCOUNTER — Other Ambulatory Visit: Payer: 59

## 2023-05-08 ENCOUNTER — Inpatient Hospital Stay: Payer: 59

## 2023-05-08 ENCOUNTER — Emergency Department: Payer: 59

## 2023-05-08 ENCOUNTER — Inpatient Hospital Stay
Admission: EM | Admit: 2023-05-08 | Discharge: 2023-05-14 | DRG: 065 | Disposition: A | Discharge status: Rehabilitation Facility | Payer: 59 | Attending: Student | Admitting: Student

## 2023-05-08 ENCOUNTER — Other Ambulatory Visit: Payer: Self-pay

## 2023-05-08 DIAGNOSIS — R29703 NIHSS score 3: Secondary | ICD-10-CM | POA: Diagnosis present

## 2023-05-08 DIAGNOSIS — I6349 Cerebral infarction due to embolism of other cerebral artery: Principal | ICD-10-CM | POA: Diagnosis present

## 2023-05-08 DIAGNOSIS — R410 Disorientation, unspecified: Secondary | ICD-10-CM | POA: Diagnosis present

## 2023-05-08 DIAGNOSIS — M25811 Other specified joint disorders, right shoulder: Secondary | ICD-10-CM | POA: Diagnosis not present

## 2023-05-08 DIAGNOSIS — G4730 Sleep apnea, unspecified: Secondary | ICD-10-CM | POA: Diagnosis present

## 2023-05-08 DIAGNOSIS — I10 Essential (primary) hypertension: Secondary | ICD-10-CM | POA: Diagnosis present

## 2023-05-08 DIAGNOSIS — Z8249 Family history of ischemic heart disease and other diseases of the circulatory system: Secondary | ICD-10-CM | POA: Diagnosis not present

## 2023-05-08 DIAGNOSIS — E669 Obesity, unspecified: Secondary | ICD-10-CM | POA: Diagnosis present

## 2023-05-08 DIAGNOSIS — Z885 Allergy status to narcotic agent status: Secondary | ICD-10-CM

## 2023-05-08 DIAGNOSIS — E871 Hypo-osmolality and hyponatremia: Secondary | ICD-10-CM | POA: Diagnosis present

## 2023-05-08 DIAGNOSIS — Z90710 Acquired absence of both cervix and uterus: Secondary | ICD-10-CM

## 2023-05-08 DIAGNOSIS — I6389 Other cerebral infarction: Secondary | ICD-10-CM | POA: Diagnosis not present

## 2023-05-08 DIAGNOSIS — Z79899 Other long term (current) drug therapy: Secondary | ICD-10-CM | POA: Diagnosis not present

## 2023-05-08 DIAGNOSIS — I639 Cerebral infarction, unspecified: Secondary | ICD-10-CM | POA: Diagnosis present

## 2023-05-08 DIAGNOSIS — F419 Anxiety disorder, unspecified: Secondary | ICD-10-CM | POA: Diagnosis present

## 2023-05-08 DIAGNOSIS — Z86718 Personal history of other venous thrombosis and embolism: Secondary | ICD-10-CM | POA: Diagnosis not present

## 2023-05-08 DIAGNOSIS — R531 Weakness: Principal | ICD-10-CM

## 2023-05-08 DIAGNOSIS — I6350 Cerebral infarction due to unspecified occlusion or stenosis of unspecified cerebral artery: Secondary | ICD-10-CM | POA: Diagnosis not present

## 2023-05-08 DIAGNOSIS — E861 Hypovolemia: Secondary | ICD-10-CM | POA: Diagnosis present

## 2023-05-08 DIAGNOSIS — K219 Gastro-esophageal reflux disease without esophagitis: Secondary | ICD-10-CM | POA: Diagnosis present

## 2023-05-08 DIAGNOSIS — Z8261 Family history of arthritis: Secondary | ICD-10-CM

## 2023-05-08 DIAGNOSIS — Z9884 Bariatric surgery status: Secondary | ICD-10-CM

## 2023-05-08 DIAGNOSIS — D509 Iron deficiency anemia, unspecified: Secondary | ICD-10-CM | POA: Diagnosis present

## 2023-05-08 DIAGNOSIS — I951 Orthostatic hypotension: Secondary | ICD-10-CM | POA: Diagnosis present

## 2023-05-08 DIAGNOSIS — E66812 Obesity, class 2: Secondary | ICD-10-CM | POA: Diagnosis present

## 2023-05-08 DIAGNOSIS — E872 Acidosis, unspecified: Secondary | ICD-10-CM | POA: Diagnosis present

## 2023-05-08 DIAGNOSIS — D508 Other iron deficiency anemias: Secondary | ICD-10-CM | POA: Diagnosis not present

## 2023-05-08 DIAGNOSIS — Z96651 Presence of right artificial knee joint: Secondary | ICD-10-CM | POA: Diagnosis present

## 2023-05-08 DIAGNOSIS — G894 Chronic pain syndrome: Secondary | ICD-10-CM | POA: Diagnosis not present

## 2023-05-08 DIAGNOSIS — Z87891 Personal history of nicotine dependence: Secondary | ICD-10-CM | POA: Diagnosis not present

## 2023-05-08 DIAGNOSIS — Z6839 Body mass index (BMI) 39.0-39.9, adult: Secondary | ICD-10-CM

## 2023-05-08 DIAGNOSIS — M1990 Unspecified osteoarthritis, unspecified site: Secondary | ICD-10-CM | POA: Diagnosis present

## 2023-05-08 DIAGNOSIS — H5340 Unspecified visual field defects: Secondary | ICD-10-CM | POA: Diagnosis present

## 2023-05-08 LAB — HEPATIC FUNCTION PANEL
ALT: 9 U/L (ref 0–44)
AST: 13 U/L — ABNORMAL LOW (ref 15–41)
Albumin: 2.2 g/dL — ABNORMAL LOW (ref 3.5–5.0)
Alkaline Phosphatase: 90 U/L (ref 38–126)
Bilirubin, Direct: <0.1 mg/dL (ref 0.0–0.2)
Total Bilirubin: 0.4 mg/dL (ref <1.2)
Total Protein: 4.5 g/dL — ABNORMAL LOW (ref 6.5–8.1)

## 2023-05-08 LAB — BLOOD GAS, VENOUS
Acid-base deficit: 2.2 mmol/L — ABNORMAL HIGH (ref 0.0–2.0)
Bicarbonate: 23.2 mmol/L (ref 20.0–28.0)
O2 Saturation: 53.3 %
Patient temperature: 37
pCO2, Ven: 41 mm[Hg] — ABNORMAL LOW (ref 44–60)
pH, Ven: 7.36 (ref 7.25–7.43)
pO2, Ven: 33 mm[Hg] (ref 32–45)

## 2023-05-08 LAB — CBC
HCT: 36.9 % (ref 36.0–46.0)
Hemoglobin: 11.8 g/dL — ABNORMAL LOW (ref 12.0–15.0)
MCH: 31.8 pg (ref 26.0–34.0)
MCHC: 32 g/dL (ref 30.0–36.0)
MCV: 99.5 fL (ref 80.0–100.0)
Platelets: 402 10*3/uL — ABNORMAL HIGH (ref 150–400)
RBC: 3.71 MIL/uL — ABNORMAL LOW (ref 3.87–5.11)
RDW: 13.3 % (ref 11.5–15.5)
WBC: 6.9 10*3/uL (ref 4.0–10.5)
nRBC: 0 % (ref 0.0–0.2)

## 2023-05-08 LAB — URINALYSIS, ROUTINE W REFLEX MICROSCOPIC
Bilirubin Urine: NEGATIVE
Glucose, UA: NEGATIVE mg/dL
Hgb urine dipstick: NEGATIVE
Ketones, ur: NEGATIVE mg/dL
Leukocytes,Ua: NEGATIVE
Nitrite: NEGATIVE
Protein, ur: NEGATIVE mg/dL
Specific Gravity, Urine: 1.019 (ref 1.005–1.030)
pH: 5 (ref 5.0–8.0)

## 2023-05-08 LAB — BASIC METABOLIC PANEL
Anion gap: 15 (ref 5–15)
BUN: 18 mg/dL (ref 8–23)
CO2: 16 mmol/L — ABNORMAL LOW (ref 22–32)
Calcium: 7.1 mg/dL — ABNORMAL LOW (ref 8.9–10.3)
Chloride: 94 mmol/L — ABNORMAL LOW (ref 98–111)
Creatinine, Ser: 0.85 mg/dL (ref 0.44–1.00)
GFR, Estimated: >60 mL/min (ref >60)
Glucose, Bld: 72 mg/dL (ref 70–99)
Potassium: 4.8 mmol/L (ref 3.5–5.1)
Sodium: 125 mmol/L — ABNORMAL LOW (ref 135–145)

## 2023-05-08 LAB — TROPONIN I (HIGH SENSITIVITY): Troponin I (High Sensitivity): 4 ng/L (ref <18)

## 2023-05-08 LAB — SODIUM
Sodium: 142 mmol/L (ref 135–145)
Sodium: 142 mmol/L (ref 135–145)
Sodium: 138 mmol/L (ref 135–145)

## 2023-05-08 LAB — OSMOLALITY, URINE: Osmolality, Ur: 686 mosm/kg (ref 300–900)

## 2023-05-08 LAB — LACTIC ACID, PLASMA
Lactic Acid, Venous: 0.4 mmol/L — ABNORMAL LOW (ref 0.5–1.9)
Lactic Acid, Venous: 0.9 mmol/L (ref 0.5–1.9)

## 2023-05-08 LAB — OSMOLALITY: Osmolality: 273 mosm/kg — ABNORMAL LOW (ref 275–295)

## 2023-05-08 LAB — CREATININE, URINE, RANDOM: Creatinine, Urine: 106 mg/dL

## 2023-05-08 LAB — SODIUM, URINE, RANDOM: Sodium, Ur: 159 mmol/L

## 2023-05-08 LAB — HEMOGLOBIN A1C
Hgb A1c MFr Bld: 4.3 % — ABNORMAL LOW (ref 4.8–5.6)
Mean Plasma Glucose: 77 mg/dL

## 2023-05-08 LAB — HIV ANTIBODY (ROUTINE TESTING W REFLEX): HIV Screen 4th Generation wRfx: NONREACTIVE

## 2023-05-08 MED ORDER — CLOPIDOGREL BISULFATE 75 MG PO TABS
75.0000 mg | ORAL_TABLET | Freq: Every day | ORAL | Status: DC
  Administered 2023-05-09 – 2023-05-14 (×6): 75 mg via ORAL
  Filled 2023-05-08 (×6): qty 1

## 2023-05-08 MED ORDER — BUTALBITAL-APAP-CAFFEINE 50-325-40 MG PO TABS
1.0000 | ORAL_TABLET | Freq: Once | ORAL | Status: AC
  Administered 2023-05-08: 1 via ORAL
  Filled 2023-05-08: qty 1

## 2023-05-08 MED ORDER — STROKE: EARLY STAGES OF RECOVERY BOOK: Freq: Once | Status: AC

## 2023-05-08 MED ORDER — SODIUM CHLORIDE 0.9 % IV BOLUS
1000.0000 mL | Freq: Once | INTRAVENOUS | Status: AC
  Administered 2023-05-08: 1000 mL via INTRAVENOUS

## 2023-05-08 MED ORDER — SODIUM CHLORIDE 0.9 % IV SOLN
INTRAVENOUS | Status: DC
Start: 2023-05-08 — End: 2023-05-09

## 2023-05-08 MED ORDER — LABETALOL HCL 5 MG/ML IV SOLN: 10.0000 mg | INTRAVENOUS | Status: DC | PRN

## 2023-05-08 MED ORDER — PANTOPRAZOLE SODIUM 40 MG PO TBEC
40.0000 mg | DELAYED_RELEASE_TABLET | Freq: Every day | ORAL | Status: DC
  Administered 2023-05-08 – 2023-05-14 (×7): 40 mg via ORAL
  Filled 2023-05-08 (×7): qty 1

## 2023-05-08 MED ORDER — ACETAMINOPHEN 160 MG/5ML PO SOLN: 650.0000 mg | ORAL | Status: DC | PRN

## 2023-05-08 MED ORDER — IOHEXOL 350 MG/ML SOLN
75.0000 mL | Freq: Once | INTRAVENOUS | Status: AC | PRN
  Administered 2023-05-08: 75 mL via INTRAVENOUS

## 2023-05-08 MED ORDER — ACETAMINOPHEN 325 MG PO TABS
650.0000 mg | ORAL_TABLET | ORAL | Status: DC | PRN
  Administered 2023-05-08 – 2023-05-09 (×3): 650 mg via ORAL
  Filled 2023-05-08 (×3): qty 2

## 2023-05-08 MED ORDER — ASPIRIN 81 MG PO CHEW
324.0000 mg | CHEWABLE_TABLET | Freq: Once | ORAL | Status: AC
  Administered 2023-05-08: 324 mg via ORAL
  Filled 2023-05-08: qty 4

## 2023-05-08 MED ORDER — ACETAMINOPHEN 650 MG RE SUPP: 650.0000 mg | RECTAL | Status: DC | PRN

## 2023-05-08 MED ORDER — SENNOSIDES-DOCUSATE SODIUM 8.6-50 MG PO TABS: 1.0000 | ORAL_TABLET | Freq: Every evening | ORAL | Status: DC | PRN

## 2023-05-08 MED ORDER — ASPIRIN 81 MG PO TBEC
81.0000 mg | DELAYED_RELEASE_TABLET | Freq: Every day | ORAL | Status: DC
  Administered 2023-05-09 – 2023-05-14 (×6): 81 mg via ORAL
  Filled 2023-05-08 (×6): qty 1

## 2023-05-08 MED ORDER — CLOPIDOGREL BISULFATE 75 MG PO TABS
300.0000 mg | ORAL_TABLET | Freq: Once | ORAL | Status: AC
  Administered 2023-05-08: 300 mg via ORAL
  Filled 2023-05-08: qty 4

## 2023-05-08 MED ORDER — ENOXAPARIN SODIUM 40 MG/0.4ML IJ SOSY
40.0000 mg | PREFILLED_SYRINGE | INTRAMUSCULAR | Status: DC
  Administered 2023-05-08 – 2023-05-13 (×6): 40 mg via SUBCUTANEOUS
  Filled 2023-05-08 (×6): qty 0.4

## 2023-05-08 NOTE — ED Notes (Signed)
Pt brought in by [facility] from home with co weakness and headache. sTates fell last night but did not come to Ed for eval.

## 2023-05-08 NOTE — ED Triage Notes (Signed)
[facility] reports pt coming from home. Pt c/o weakness that started last night around 2300. Pt states she was walking to the bathroom and passed out. Pt denies any pain from fall.

## 2023-05-08 NOTE — Assessment & Plan Note (Signed)
Na 125 on presentation  Clinically dry  Likely confounder to CVA on presentation  Gentle IVF hydration  Check urine and serum studies Goal 6-8 mEq change in sodium over next 12-24 hours  Monitor

## 2023-05-08 NOTE — Assessment & Plan Note (Signed)
Ppi

## 2023-05-08 NOTE — Assessment & Plan Note (Signed)
Worsening dizziness, falls, confusion over past 24 hours with noted L occipital CVA on CT head  Will plan for formal stroke evaluation including MRI brain, CTA head and neck, 2D ECHO, risk stratification labs  Neurology consulted  Follow up recommendations

## 2023-05-08 NOTE — H&P (Addendum)
History and Physical    Patient: Christine Reyes:[id number] DOB: 1955/10/02 DOA: 05/08/2023 DOS: the patient was seen and examined on 05/08/2023 PCP: Barbette Reichmann, MD  Patient coming from: Home  Chief Complaint:  Chief Complaint  Patient presents with   Weakness   HPI: Christine Reyes is a 67 y.o. female with medical history significant of hypertension, obesity status post gastric bypass presenting with CVA.  Patient reports development of generalized weakness as well as confusion since yesterday.  Patient states she attempted to use the bathroom and does not fully remember what happened after that.  Patient states she had to call her mother to help her.  Has had persistent weakness and confusion since this morning.  No chest pain or shortness of breath.  No nausea or vomiting.  Patient denies any prior episodes like this in the past.  Non-smoker.  No alcohol use.  Baseline hypertension.  Unclear of blood pressure control.  Does report taking high-dose BC powders on a regular basis for joint pain.  No focal hemiparesis.  Positive generalized confusion. Presented to the ER afebrile, blood pressure 130s to 150s over 70s to 110s.  Satting well on room air.  White count 6.9, hemoglobin 11.8, platelets 402, lactate 0.4.  Troponin within normal limits.  Urinalysis stable.  Creatinine 0.85.  Sodium 125.  Found to be orthostatic positive in the ER.  CT head with acute to subacute cortical infarcts in the left occipital lobe. Review of Systems: As mentioned in the history of present illness. All other systems reviewed and are negative. Past Medical History:  Diagnosis Date   Anxiety    Arthritis    Headache    Hypertension    Iron deficiency anemia 06/17/2018   Obesity    Sleep apnea    Past Surgical History:  Procedure Laterality Date   ABDOMINAL HYSTERECTOMY     BREAST EXCISIONAL BIOPSY Right 1989   CHOLECYSTECTOMY     COLONOSCOPY WITH ESOPHAGOGASTRODUODENOSCOPY (EGD)     csection  x2     DILATION AND CURETTAGE OF UTERUS     ESOPHAGOGASTRODUODENOSCOPY (EGD) WITH PROPOFOL N/A 12/14/2015   Procedure: ESOPHAGOGASTRODUODENOSCOPY (EGD) WITH PROPOFOL;  Surgeon: Scot Jun, MD;  Location: [facility] ENDOSCOPY;  Service: Endoscopy;  Laterality: N/A;   GASTRIC BYPASS     JOINT REPLACEMENT     left knee surgery     TOTAL KNEE ARTHROPLASTY Right 06/22/2017   Procedure: TOTAL KNEE ARTHROPLASTY;  Surgeon: Lyndle Herrlich, MD;  Location: [facility] ORS;  Service: Orthopedics;  Laterality: Right;   Social History:  reports that she quit smoking about 7 years ago. Her smoking use included cigarettes. She has quit using smokeless tobacco. She reports current alcohol use of about 2.0 standard drinks of alcohol per week. She reports that she does not use drugs.  Allergies  Allergen Reactions   Morphine And Codeine Itching   Tramadol Itching    Family History  Problem Relation Age of Onset   Hypertension Mother    Arthritis Mother    Cancer Father    Bipolar disorder Daughter    Breast cancer Neg Hx     Prior to Admission medications   Medication Sig Start Date End Date Taking? Authorizing Provider  Aspirin-Salicylamide-Caffeine (ARTHRITIS STRENGTH BC POWDER PO) Take by mouth.   Yes [provider]  gabapentin (NEURONTIN) 300 MG capsule Take 1 capsule (300 mg total) by mouth 2 (two) times daily. 01/18/19  Yes Edward Jolly, MD  Iron-Vitamin C 65-125  MG TABS Take 1 tablet by mouth daily. 03/05/21  Yes Rickard Patience, MD  naloxone [facility]) 4 MG/0.1ML LIQD nasal spray kit Inhale 4 mg into the lungs as needed. 01/01/18  Yes [provider]  olmesartan-hydrochlorothiazide (BENICAR HCT) 40-25 MG tablet Take 1 tablet by mouth daily. 05/20/17  Yes [provider]  ondansetron (ZOFRAN-ODT) 4 MG disintegrating tablet Take 1 tablet (4 mg total) by mouth every 8 (eight) hours as needed for nausea or vomiting. 08/07/22  Yes Shaune Pollack, MD  Oxycodone HCl 20 MG TABS Take 20 mg by  mouth 2 (two) times daily as needed. 12/28/18  Yes [provider]  pantoprazole (PROTONIX) 40 MG tablet Take 40 mg by mouth 2 (two) times daily as needed (heartburn).    Yes [provider]  dicyclomine (BENTYL) 20 MG tablet Take 1 tablet (20 mg total) by mouth 4 (four) times daily -  before meals and at bedtime for 7 days. Patient not taking: Reported on 05/08/2023 08/07/22 08/14/22  Shaune Pollack, MD  ibuprofen (ADVIL) 600 MG tablet Take 1 tablet (600 mg total) by mouth every 8 (eight) hours as needed for moderate pain. Patient not taking: Reported on 05/08/2023 08/07/22   Shaune Pollack, MD  Oxycodone HCl 20 MG TABS Take by mouth. Patient not taking: Reported on 02/19/2021 01/27/19   [provider]    Physical Exam: Vitals:   05/08/23 1300 05/08/23 1312 05/08/23 1330 05/08/23 1400  BP: 139/79  (!) 152/74 (!) 132/117  Pulse: 66  66 73  Resp: 18  17 16   Temp:  98 F (36.7 C)    TempSrc:  Oral    SpO2: 97%  97% 97%   Physical Exam Constitutional:      Appearance: She is normal weight.     Comments: + mild generalized confusion   HENT:     Head: Normocephalic and atraumatic.     Nose: Nose normal.     Mouth/Throat:     Mouth: Mucous membranes are moist.  Eyes:     Pupils: Pupils are equal, round, and reactive to light.  Cardiovascular:     Rate and Rhythm: Normal rate and regular rhythm.  Pulmonary:     Effort: Pulmonary effort is normal.  Abdominal:     General: Bowel sounds are normal.  Musculoskeletal:     Comments: + generalized weakness    Neurological:     General: No focal deficit present.     Comments: + generalized confusion  + generalized weakness    Psychiatric:        Mood and Affect: Mood normal.     Data Reviewed:  There are no new results to review at this time.  CT Cervical Spine Wo Contrast CLINICAL DATA:  Head trauma, minor (Age >= 65y); Neck trauma (Age >= 65y)  EXAM: CT HEAD WITHOUT CONTRAST  CT CERVICAL SPINE  WITHOUT CONTRAST  TECHNIQUE: Multidetector CT imaging of the head and cervical spine was performed following the standard protocol without intravenous contrast. Multiplanar CT image reconstructions of the cervical spine were also generated.  RADIATION DOSE REDUCTION: This exam was performed according to the departmental dose-optimization program which includes automated exposure control, adjustment of the mA and/or kV according to patient size and/or use of iterative reconstruction technique.  COMPARISON:  None Available.  FINDINGS: CT HEAD FINDINGS  Brain: No hemorrhage. No hydrocephalus. No extra-axial fluid collection. 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 No mass effect.  No mass lesion.  Vascular: No hyperdense vessel or unexpected calcification.  Skull: Normal. Negative for fracture or focal lesion.  Sinuses/Orbits: No middle ear or mastoid effusion. Paranasal sinuses notable for mucosal thickening in the right sphenoid sinus. Orbits are unremarkable.  Other: None.  CT CERVICAL SPINE FINDINGS  Alignment: Normal.  Skull base and vertebrae: No acute fracture. No primary bone lesion or focal pathologic process.  Soft tissues and spinal canal: No prevertebral fluid or swelling. No visible canal hematoma.  Disc levels:  No evidence of high-grade stenosis.  Upper chest: Negative.  Other: None  IMPRESSION: 1. 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 Recommend brain MRI for further evaluation. 2. No CT evidence of intracranial injury 3. No acute fracture or traumatic subluxation of the cervical spine.  Electronically Signed   By: Lorenza Cambridge M.D.   On: 05/08/2023 13:11 CT Head Wo Contrast CLINICAL DATA:  Head trauma, minor (Age >= 65y); Neck trauma (Age >= 65y)  EXAM: CT HEAD WITHOUT CONTRAST  CT CERVICAL SPINE WITHOUT CONTRAST  TECHNIQUE: Multidetector CT imaging of the head and  cervical spine was performed following the standard protocol without intravenous contrast. Multiplanar CT image reconstructions of the cervical spine were also generated.  RADIATION DOSE REDUCTION: This exam was performed according to the departmental dose-optimization program which includes automated exposure control, adjustment of the mA and/or kV according to patient size and/or use of iterative reconstruction technique.  COMPARISON:  None Available.  FINDINGS: CT HEAD FINDINGS  Brain: No hemorrhage. No hydrocephalus. No extra-axial fluid collection. 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 No mass effect. No mass lesion.  Vascular: No hyperdense vessel or unexpected calcification.  Skull: Normal. Negative for fracture or focal lesion.  Sinuses/Orbits: No middle ear or mastoid effusion. Paranasal sinuses notable for mucosal thickening in the right sphenoid sinus. Orbits are unremarkable.  Other: None.  CT CERVICAL SPINE FINDINGS  Alignment: Normal.  Skull base and vertebrae: No acute fracture. No primary bone lesion or focal pathologic process.  Soft tissues and spinal canal: No prevertebral fluid or swelling. No visible canal hematoma.  Disc levels:  No evidence of high-grade stenosis.  Upper chest: Negative.  Other: None  IMPRESSION: 1. 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 Recommend brain MRI for further evaluation. 2. No CT evidence of intracranial injury 3. No acute fracture or traumatic subluxation of the cervical spine.  Electronically Signed   By: Lorenza Cambridge M.D.   On: 05/08/2023 13:11  Lab Results  Component Value Date   WBC 6.9 05/08/2023   HGB 11.8 (L) 05/08/2023   HCT 36.9 05/08/2023   MCV 99.5 05/08/2023   PLT 402 (H) 05/08/2023   Last metabolic panel Lab Results  Component Value Date   GLUCOSE 72 05/08/2023   NA 125 (L) 05/08/2023   K 4.8 05/08/2023   CL 94 (L)  05/08/2023   CO2 16 (L) 05/08/2023   BUN 18 05/08/2023   CREATININE 0.85 05/08/2023   GFRNONAA >60 05/08/2023   CALCIUM 7.1 (L) 05/08/2023   PROT 7.2 08/07/2022   ALBUMIN 3.5 08/07/2022   BILITOT 0.5 08/07/2022   ALKPHOS 115 08/07/2022   AST 14 (L) 08/07/2022   ALT 11 08/07/2022   ANIONGAP 15 05/08/2023    Assessment and Plan: * CVA (cerebral vascular accident) (HCC) Worsening dizziness, falls, confusion over past 24 hours with noted L occipital CVA on CT head  Will plan  for formal stroke evaluation including MRI brain, CTA head and neck, 2D ECHO, risk stratification labs  Neurology consulted  Follow up recommendations   Hyponatremia Na 125 on presentation  Clinically dry  Likely confounder to CVA on presentation  Gentle IVF hydration  Check urine and serum studies Goal 6-8 mEq change in sodium over next 12-24 hours  Monitor    Gastroesophageal reflux disease without esophagitis Ppi   Hypertensive disorder Allow for permissive HTN in setting of CVA evaluation  Prn IV labetalol for SBP>220 or DBP>110     Greater than 50% was spent in counseling and coordination of care with patient Total encounter time 80 minutes or more    Advance Care Planning:   Code Status: Full Code   Consults: Neuro   Family Communication: No family at the bedside   Severity of Illness: The appropriate patient status for this patient is INPATIENT. Inpatient status is judged to be reasonable and necessary in order to provide the required intensity of service to ensure the patient's safety. The patient's presenting symptoms, physical exam findings, and initial radiographic and laboratory data in the context of their chronic comorbidities is felt to place them at high risk for further clinical deterioration. Furthermore, it is not anticipated that the patient will be medically stable for discharge from the hospital within 2 midnights of admission.   * I certify that at the point of admission  it is my clinical judgment that the patient will require inpatient hospital care spanning beyond 2 midnights from the point of admission due to high intensity of service, high risk for further deterioration and high frequency of surveillance required.*  Author: Floydene Flock, MD 05/08/2023 3:01 PM  For on call review [email or web address].

## 2023-05-08 NOTE — ED Notes (Signed)
ED TO INPATIENT HANDOFF REPORT  ED Nurse Name and Phone #: [phone]  S Name/Age/Gender Christine Reyes 67 y.o. female Room/Bed: ED04A/ED04A  Code Status   Code Status: Full Code  Home/SNF/Other Home Patient oriented to: self, place, time, and situation Is this baseline? Yes   Triage Complete: Triage complete  Chief Complaint CVA (cerebral vascular accident) [facility]) [I63.9]  Triage Note [facility] reports pt coming from home. Pt c/o weakness that started last night around 2300. Pt states she was walking to the bathroom and passed out. Pt denies any pain from fall.   Allergies Allergies  Allergen Reactions   Morphine And Codeine Itching   Tramadol Itching    Level of Care/Admitting Diagnosis ED Disposition     ED Disposition  Admit   Condition  --   Comment  Hospital Area: [facility] [[id number]]  Level of Care: Telemetry Medical [[id number]]  Covid Evaluation: Confirmed COVID Negative  Diagnosis: CVA (cerebral vascular accident) [facility]) [[id number]]  Admitting Physician: Floydene Flock [[id number]]  Attending Physician: Floydene Flock [phone]  Certification:: I certify this patient will need inpatient services for at least 2 midnights  Expected Medical Readiness: 05/10/2023          B Medical/Surgery History Past Medical History:  Diagnosis Date   Anxiety    Arthritis    Headache    Hypertension    Iron deficiency anemia 06/17/2018   Obesity    Sleep apnea    Past Surgical History:  Procedure Laterality Date   ABDOMINAL HYSTERECTOMY     BREAST EXCISIONAL BIOPSY Right 1989   CHOLECYSTECTOMY     COLONOSCOPY WITH ESOPHAGOGASTRODUODENOSCOPY (EGD)     csection x2     DILATION AND CURETTAGE OF UTERUS     ESOPHAGOGASTRODUODENOSCOPY (EGD) WITH PROPOFOL N/A 12/14/2015   Procedure: ESOPHAGOGASTRODUODENOSCOPY (EGD) WITH PROPOFOL;  Surgeon: Scot Jun, MD;  Location: [facility] ENDOSCOPY;  Service: Endoscopy;  Laterality: N/A;   GASTRIC BYPASS     JOINT REPLACEMENT      left knee surgery     TOTAL KNEE ARTHROPLASTY Right 06/22/2017   Procedure: TOTAL KNEE ARTHROPLASTY;  Surgeon: Lyndle Herrlich, MD;  Location: [facility] ORS;  Service: Orthopedics;  Laterality: Right;     A IV Location/Drains/Wounds Patient Lines/Drains/Airways Status     Active Line/Drains/Airways     Name Placement date Placement time Site Days   Peripheral IV 05/08/23 22 G Anterior;Left;Proximal Forearm 05/08/23  1240  Forearm  less than 1   Peripheral IV 05/08/23 20 G 1.88" Anterior;Distal;Left;Upper Arm 05/08/23  1619  Arm  less than 1            Intake/Output Last 24 hours  Intake/Output Summary (Last 24 hours) at 05/08/2023 1938 Last data filed at 05/08/2023 1449 Gross per 24 hour  Intake 8 ml  Output --  Net 8 ml    Labs/Imaging Results for orders placed or performed during the hospital encounter of 05/08/23 (from the past 48 hours)  Basic metabolic panel     Status: Abnormal   Collection Time: 05/08/23  9:26 AM  Result Value Ref Range   Sodium 125 (L) 135 - 145 mmol/L   Potassium 4.8 3.5 - 5.1 mmol/L   Chloride 94 (L) 98 - 111 mmol/L   CO2 16 (L) 22 - 32 mmol/L   Glucose, Bld 72 70 - 99 mg/dL    Comment: Glucose reference range applies only to samples taken after fasting for at least 8 hours.  BUN 18 8 - 23 mg/dL   Creatinine, Ser 5.95 0.44 - 1.00 mg/dL   Calcium 7.1 (L) 8.9 - 10.3 mg/dL   GFR, Estimated >63 >87 mL/min    Comment: (NOTE) Calculated using the CKD-EPI Creatinine Equation (2021)    Anion gap 15 5 - 15    Comment: Performed at [facility], [address] [place], [place] [address]  CBC     Status: Abnormal   Collection Time: 05/08/23  9:26 AM  Result Value Ref Range   WBC 6.9 4.0 - 10.5 K/uL   RBC 3.71 (L) 3.87 - 5.11 MIL/uL   Hemoglobin 11.8 (L) 12.0 - 15.0 g/dL   HCT 29.5 18.8 - 41.6 %   MCV 99.5 80.0 - 100.0 fL   MCH 31.8 26.0 - 34.0 pg   MCHC 32.0 30.0 - 36.0 g/dL   RDW 60.6 30.1 - 60.1 %   Platelets 402 (H) 150 - 400  K/uL   nRBC 0.0 0.0 - 0.2 %    Comment: Performed at [facility], [address] [place], [place] [address]  Osmolality     Status: Abnormal   Collection Time: 05/08/23  9:26 AM  Result Value Ref Range   Osmolality 273 (L) 275 - 295 mOsm/kg    Comment: REPEATED TO VERIFY Performed at [facility], [address] [place] [address] [place], [place] [address]   Urinalysis, Routine w reflex microscopic -Urine, Clean Catch     Status: Abnormal   Collection Time: 05/08/23 11:36 AM  Result Value Ref Range   Color, Urine YELLOW (A) YELLOW   APPearance HAZY (A) CLEAR   Specific Gravity, Urine 1.019 1.005 - 1.030   pH 5.0 5.0 - 8.0   Glucose, UA NEGATIVE NEGATIVE mg/dL   Hgb urine dipstick NEGATIVE NEGATIVE   Bilirubin Urine NEGATIVE NEGATIVE   Ketones, ur NEGATIVE NEGATIVE mg/dL   Protein, ur NEGATIVE NEGATIVE mg/dL   Nitrite NEGATIVE NEGATIVE   Leukocytes,Ua NEGATIVE NEGATIVE    Comment: Performed at [facility], [address] [place], [place] [address]  Osmolality, urine     Status: None   Collection Time: 05/08/23 11:36 AM  Result Value Ref Range   Osmolality, Ur 686 300 - 900 mOsm/kg    Comment: REPEATED TO VERIFY Performed at [facility], [address] [place], [place] [address]   Sodium, urine, random     Status: None   Collection Time: 05/08/23 11:36 AM  Result Value Ref Range   Sodium, Ur 159 mmol/L    Comment: Performed at [facility], [address] [place], [place] [address]  Creatinine, urine, random     Status: None   Collection Time: 05/08/23 11:36 AM  Result Value Ref Range   Creatinine, Urine 106 mg/dL    Comment: Performed at [facility], [address] [place], [place] [address]  Troponin I (High Sensitivity)     Status: None   Collection Time: 05/08/23 12:44 PM  Result Value Ref Range   Troponin I (High Sensitivity) 4 <18 ng/L    Comment: (NOTE) Elevated high sensitivity troponin I (hsTnI) values and  significant  changes across serial measurements may suggest ACS but many other  chronic and acute conditions are known to elevate hsTnI results.  Refer to the "Links" section for chest pain algorithms and additional  guidance. Performed at [facility], [address] [place], [place] [address]   Lactic acid, plasma     Status: Abnormal  Collection Time: 05/08/23  1:59 PM  Result Value Ref Range   Lactic Acid, Venous 0.4 (L) 0.5 - 1.9 mmol/L    Comment: Performed at [facility], [address] [place], [place] [address]  Blood gas, venous     Status: Abnormal   Collection Time: 05/08/23  2:46 PM  Result Value Ref Range   pH, Ven 7.36 7.25 - 7.43   pCO2, Ven 41 (L) 44 - 60 mmHg   pO2, Ven 33 32 - 45 mmHg   Bicarbonate 23.2 20.0 - 28.0 mmol/L   Acid-base deficit 2.2 (H) 0.0 - 2.0 mmol/L   O2 Saturation 53.3 %   Patient temperature 37.0    Collection site VENOUS     Comment: Performed at [facility], [address], [place], [place] [address]  Lactic acid, plasma     Status: None   Collection Time: 05/08/23  5:30 PM  Result Value Ref Range   Lactic Acid, Venous 0.9 0.5 - 1.9 mmol/L    Comment: Performed at [facility], [address] [place], [place] [address]  Sodium     Status: None   Collection Time: 05/08/23  5:30 PM  Result Value Ref Range   Sodium 142 135 - 145 mmol/L    Comment: REPEATED TO VERIFY   SH Performed at [facility], [address] [place], [place] [address]   Sodium     Status: None   Collection Time: 05/08/23  5:30 PM  Result Value Ref Range   Sodium 142 135 - 145 mmol/L    Comment: Performed at [facility], [address] [place], [place] [address]  Hepatic function panel     Status: Abnormal   Collection Time: 05/08/23  5:30 PM  Result Value Ref Range   Total Protein 4.5 (L) 6.5 - 8.1 g/dL   Albumin 2.2 (L) 3.5 - 5.0 g/dL   AST 13 (L) 15 - 41 U/L   ALT 9 0 - 44 U/L   Alkaline  Phosphatase 90 38 - 126 U/L   Total Bilirubin 0.4 <1.2 mg/dL   Bilirubin, Direct <5.3 0.0 - 0.2 mg/dL   Indirect Bilirubin NOT CALCULATED 0.3 - 0.9 mg/dL    Comment: Performed at [facility], [address] [place], [place] [address]   CT ANGIO HEAD NECK W WO CM Result Date: 05/08/2023 CLINICAL DATA:  Weakness beginning last night.  Syncopal episode. EXAM: CT ANGIOGRAPHY HEAD AND NECK WITH AND WITHOUT CONTRAST TECHNIQUE: Multidetector CT imaging of the head and neck was performed using the standard protocol during bolus administration of intravenous contrast. Multiplanar CT image reconstructions and MIPs were obtained to evaluate the vascular anatomy. Carotid stenosis measurements (when applicable) are obtained utilizing NASCET criteria, using the distal internal carotid diameter as the denominator. RADIATION DOSE REDUCTION: This exam was performed according to the departmental dose-optimization program which includes automated exposure control, adjustment of the mA and/or kV according to patient size and/or use of iterative reconstruction technique. CONTRAST:  75mL OMNIPAQUE IOHEXOL 350 MG/ML SOLN COMPARISON:  CT head without contrast 05/08/2023 and 01/09/2021. FINDINGS: CTA NECK FINDINGS Aortic arch: Atherosclerotic calcifications are present in the aortic arch. No aneurysm or stenosis is present. Great vessel origins are within normal limits. Right carotid system: The right common carotid artery is within normal limits. The bifurcation is unremarkable. Mild tortuosity of the cervical right ICA is present without significant stenosis. Left carotid system: The left common carotid artery is within normal limits.  Minimal calcification is present at the bifurcation. Mild tortuosity of the cervical left ICA is present without significant stenosis. Vertebral arteries: The left vertebral artery is slightly dominant. Both vertebral arteries originate from the subclavian arteries without significant  stenosis. No significant stenosis is present in either vertebral artery in the neck. Skeleton: Mild degenerative changes are present cervical spine. Vertebral body heights and alignment are normal. No focal osseous lesions are present. Other neck: Soft tissues the neck are otherwise unremarkable. Salivary glands are within normal limits. Thyroid is normal. No significant adenopathy is present. No focal mucosal or submucosal lesions are present. Upper chest: The lung apices are clear. The thoracic inlet is within normal limits. Review of the MIP images confirms the above findings CTA HEAD FINDINGS Anterior circulation: Atherosclerotic calcifications are present within the cavernous internal carotid arteries bilaterally without focal stenosis through the ICA termini. The A1 and M1 segments are normal. The anterior communicating artery is patent. No definite anterior communicating artery is present. MCA bifurcations are within normal limits bilaterally. The ACA and MCA branch vessels are normal. No aneurysm is present. Posterior circulation: The PICA origins are visualized and normal. The vertebrobasilar junction basilar artery normal. The superior cerebellar arteries are patent bilaterally. Both posterior cerebral arteries originate from basilar tip. The PCA branch vessels are normal bilaterally. No aneurysm is present. Venous sinuses: The dural sinuses are patent. The straight sinus and deep cerebral veins are intact. Cortical veins are within normal limits. No significant vascular malformation is evident. Anatomic variants: None Review of the MIP images confirms the above findings IMPRESSION: 1. No emergent large vessel occlusion. 2. Minimal atherosclerotic changes at the carotid bifurcations and cavernous internal carotid arteries bilaterally without significant stenosis. 3. Mild tortuosity of the cervical internal carotid arteries bilaterally likely reflects chronic hypertension. 4. Mild degenerative changes of  the cervical spine. 5.  Aortic Atherosclerosis (ICD10-I70.0). Electronically Signed   By: Marin Roberts M.D.   On: 05/08/2023 18:20   CT Head Wo Contrast Result Date: 05/08/2023 CLINICAL DATA:  Head trauma, minor (Age >= 65y); Neck trauma (Age >= 65y) EXAM: CT HEAD WITHOUT CONTRAST CT CERVICAL SPINE WITHOUT CONTRAST TECHNIQUE: Multidetector CT imaging of the head and cervical spine was performed following the standard protocol without intravenous contrast. Multiplanar CT image reconstructions of the cervical spine were also generated. RADIATION DOSE REDUCTION: This exam was performed according to the departmental dose-optimization program which includes automated exposure control, adjustment of the mA and/or kV according to patient size and/or use of iterative reconstruction technique. COMPARISON:  None Available. FINDINGS: CT HEAD FINDINGS Brain: No hemorrhage. No hydrocephalus. No extra-axial fluid collection. 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 No mass effect. No mass lesion. Vascular: No hyperdense vessel or unexpected calcification. Skull: Normal. Negative for fracture or focal lesion. Sinuses/Orbits: No middle ear or mastoid effusion. Paranasal sinuses notable for mucosal thickening in the right sphenoid sinus. Orbits are unremarkable. Other: None. CT CERVICAL SPINE FINDINGS Alignment: Normal. Skull base and vertebrae: No acute fracture. No primary bone lesion or focal pathologic process. Soft tissues and spinal canal: No prevertebral fluid or swelling. No visible canal hematoma. Disc levels:  No evidence of high-grade stenosis. Upper chest: Negative. Other: None IMPRESSION: 1. 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 Recommend brain MRI for further evaluation. 2. No CT evidence of intracranial injury 3. No acute fracture or traumatic subluxation of the cervical spine. Electronically Signed   By: Lorenza Cambridge  M.D.   On:  05/08/2023 13:11   CT Cervical Spine Wo Contrast Result Date: 05/08/2023 CLINICAL DATA:  Head trauma, minor (Age >= 65y); Neck trauma (Age >= 65y) EXAM: CT HEAD WITHOUT CONTRAST CT CERVICAL SPINE WITHOUT CONTRAST TECHNIQUE: Multidetector CT imaging of the head and cervical spine was performed following the standard protocol without intravenous contrast. Multiplanar CT image reconstructions of the cervical spine were also generated. RADIATION DOSE REDUCTION: This exam was performed according to the departmental dose-optimization program which includes automated exposure control, adjustment of the mA and/or kV according to patient size and/or use of iterative reconstruction technique. COMPARISON:  None Available. FINDINGS: CT HEAD FINDINGS Brain: No hemorrhage. No hydrocephalus. No extra-axial fluid collection. 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 No mass effect. No mass lesion. Vascular: No hyperdense vessel or unexpected calcification. Skull: Normal. Negative for fracture or focal lesion. Sinuses/Orbits: No middle ear or mastoid effusion. Paranasal sinuses notable for mucosal thickening in the right sphenoid sinus. Orbits are unremarkable. Other: None. CT CERVICAL SPINE FINDINGS Alignment: Normal. Skull base and vertebrae: No acute fracture. No primary bone lesion or focal pathologic process. Soft tissues and spinal canal: No prevertebral fluid or swelling. No visible canal hematoma. Disc levels:  No evidence of high-grade stenosis. Upper chest: Negative. Other: None IMPRESSION: 1. 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 Recommend brain MRI for further evaluation. 2. No CT evidence of intracranial injury 3. No acute fracture or traumatic subluxation of the cervical spine. Electronically Signed   By: Lorenza Cambridge M.D.   On: 05/08/2023 13:11    Pending Labs Unresulted Labs (From admission, onward)     Start     Ordered   05/09/23 0500   Lipid panel  (Labs)  Tomorrow morning,   R       Comments: Fasting    05/08/23 1435   05/08/23 1800  HIV Antibody (routine testing w rflx)  Once,   R        05/08/23 1800   05/08/23 1454  Sodium  Now then every 4 hours,   TIMED      05/08/23 1453   05/08/23 1434  Hemoglobin A1c  (Labs)  Once,   R       Comments: To assess prior glycemic control    05/08/23 1435            Vitals/Pain Today's Vitals   05/08/23 1830 05/08/23 1919 05/08/23 1921 05/08/23 1922  BP: 111/72   118/72  Pulse: 80   74  Resp: 17   19  Temp:    97.9 F (36.6 C)  TempSrc:    Oral  SpO2: 98%   99%  Weight:      Height:      PainSc:  4  4  4      Isolation Precautions No active isolations  Medications Medications   stroke: early stages of recovery book (has no administration in time range)  0.9 %  sodium chloride infusion (has no administration in time range)  acetaminophen (TYLENOL) tablet 650 mg (650 mg Oral Given 05/08/23 1926)    Or  acetaminophen (TYLENOL) 160 MG/5ML solution 650 mg ( Per Tube See Alternative 05/08/23 1926)    Or  acetaminophen (TYLENOL) suppository 650 mg ( Rectal See Alternative 05/08/23 1926)  senna-docusate (Senokot-S) tablet 1 tablet (has no administration in time range)  enoxaparin (LOVENOX) injection 40 mg (40 mg Subcutaneous Given 05/08/23 1757)  aspirin  EC tablet 81 mg (has no administration in time range)  labetalol (NORMODYNE) injection 10 mg (has no administration in time range)  pantoprazole (PROTONIX) EC tablet 40 mg (40 mg Oral Given 05/08/23 1757)  sodium chloride 0.9 % bolus 1,000 mL (1,000 mLs Intravenous New Bag/Given 05/08/23 1249)  sodium chloride 0.9 % bolus 1,000 mL (1,000 mLs Intravenous New Bag/Given 05/08/23 1344)  aspirin chewable tablet 324 mg (324 mg Oral Given 05/08/23 1357)  iohexol (OMNIPAQUE) 350 MG/ML injection 75 mL (75 mLs Intravenous Contrast Given 05/08/23 1709)    Mobility       Focused Assessments Neuro Assessment  Handoff:  Swallow screen pass? Yes  Cardiac Rhythm: Normal sinus rhythm NIH Stroke Scale  Dizziness Present: No Headache Present: Yes Interval: Shift assessment Level of Consciousness (1a.)   : Alert, keenly responsive LOC Questions (1b. )   : Answers both questions correctly LOC Commands (1c. )   : Performs both tasks correctly Best Gaze (2. )  : Normal Visual (3. )  : No visual loss (pt reports blurry vision but not identified on exam) Facial Palsy (4. )    : Normal symmetrical movements Motor Arm, Left (5a. )   : No drift Motor Arm, Right (5b. ) : No drift Motor Leg, Left (6a. )  : No drift Motor Leg, Right (6b. ) : No drift Limb Ataxia (7. ): Absent Sensory (8. )  : Normal, no sensory loss Best Language (9. )  : No aphasia Dysarthria (10. ): Normal Extinction/Inattention (11.)   : No Abnormality Complete NIHSS TOTAL: 0 Last date known well: 05/07/23 Last time known well: 2300 Neuro Assessment: Within Defined Limits Neuro Checks:   Initial (05/08/23 1400)  Has TPA been given? No If patient is a Neuro Trauma and patient is going to OR before floor call report to 4N Charge nurse: [phone] or [phone]   R Recommendations: See Admitting Provider Note  Report given to:   Additional Notes:

## 2023-05-08 NOTE — ED Provider Notes (Signed)
[facility] Provider Note    Event Date/Time   First MD Initiated Contact with Patient 05/08/23 1107     (approximate)   History   Weakness   HPI  Christine Reyes is a 67 y.o. female presents to the emergency department with weakness.  Patient states that she was in her normal state of health yesterday and went to bed at approximately 8 PM.  States that she woke up around midnight and had significant weakness whenever walking to the bathroom and felt like she was going to pass out.  States that she had an episode of syncope and hit her head with mild headache since the fall.  States that since that time she has generalized weakness and every time she goes to stand up she feels weak.  Denies new changes of any of her home medications.  Not on anticoagulation.  Endorses normal urination.  Endorses normal p.o. intake yesterday and ate dinner last night.  Denies nausea, vomiting or diarrhea.     Physical Exam   Triage Vital Signs: ED Triage Vitals  Encounter Vitals Group     BP 05/08/23 0913 (!) 140/73     Systolic BP Percentile --      Diastolic BP Percentile --      Pulse Rate 05/08/23 0913 76     Resp 05/08/23 0913 18     Temp 05/08/23 0913 97.7 F (36.5 C)     Temp Source 05/08/23 0913 Oral     SpO2 05/08/23 0913 97 %     Weight --      Height --      Head Circumference --      Peak Flow --      Pain Score 05/08/23 0912 0     Pain Loc --      Pain Education --      Exclude from Growth Chart --     Most recent vital signs: Vitals:   05/08/23 1330 05/08/23 1400  BP: (!) 152/74 (!) 132/117  Pulse: 66 73  Resp: 17 16  Temp:    SpO2: 97% 97%    Physical Exam Constitutional:      Appearance: She is well-developed.  HENT:     Head: Atraumatic.  Eyes:     Conjunctiva/sclera: Conjunctivae normal.  Cardiovascular:     Rate and Rhythm: Regular rhythm.  Pulmonary:     Effort: No respiratory distress.  Abdominal:     General: There is no  distension.  Musculoskeletal:        General: Normal range of motion.     Cervical back: Normal range of motion.  Skin:    General: Skin is warm and dry.     Capillary Refill: Capillary refill takes 2 to 3 seconds.  Neurological:     Mental Status: She is alert. Mental status is at baseline.     GCS: GCS eye subscore is 4. GCS verbal subscore is 5. GCS motor subscore is 6.     Cranial Nerves: Cranial nerves 2-12 are intact.     Sensory: Sensation is intact.     Motor: Motor function is intact.     Coordination: Coordination is intact.     IMPRESSION / MDM / ASSESSMENT AND PLAN / ED COURSE  I reviewed the triage vital signs and the nursing notes.  Differential diagnosis including electrolyte abnormality, dehydration, intracranial hemorrhage, infarction, urinary tract infection  Patient's last known well was midnight.  Outside of the  window for TNK.  Not made an activated code stroke.  EKG  I, Corena Herter, the attending physician, personally viewed and interpreted this ECG.   Rate: Normal  Rhythm: Normal sinus  Axis: Normal  Intervals: Normal  ST&T Change: None  No tachycardic or bradycardic dysrhythmias while on cardiac telemetry.  RADIOLOGY I independently reviewed imaging, my interpretation of imaging: CT scan of the head without signs of intracranial hemorrhage.  Discussed with radiologist and concern for acute occipital infarct.  LABS (all labs ordered are listed, but only abnormal results are displayed) Labs interpreted as -    Labs Reviewed  BASIC METABOLIC PANEL - Abnormal; Notable for the following components:      Result Value   Sodium 125 (*)    Chloride 94 (*)    CO2 16 (*)    Calcium 7.1 (*)    All other components within normal limits  CBC - Abnormal; Notable for the following components:   RBC 3.71 (*)    Hemoglobin 11.8 (*)    Platelets 402 (*)    All other components within normal limits  URINALYSIS, ROUTINE W REFLEX MICROSCOPIC - Abnormal;  Notable for the following components:   Color, Urine YELLOW (*)    APPearance HAZY (*)    All other components within normal limits  LACTIC ACID, PLASMA - Abnormal; Notable for the following components:   Lactic Acid, Venous 0.4 (*)    All other components within normal limits  OSMOLALITY - Abnormal; Notable for the following components:   Osmolality 273 (*)    All other components within normal limits  BLOOD GAS, VENOUS - Abnormal; Notable for the following components:   pCO2, Ven 41 (*)    Acid-base deficit 2.2 (*)    All other components within normal limits  OSMOLALITY, URINE  SODIUM, URINE, RANDOM  CREATININE, URINE, RANDOM  LACTIC ACID, PLASMA  HIV ANTIBODY (ROUTINE TESTING W REFLEX)  HEMOGLOBIN A1C  SODIUM  SODIUM  SODIUM  SODIUM  HEPATIC FUNCTION PANEL  TROPONIN I (HIGH SENSITIVITY)     MDM    Patient found to have significant hyponatremia with a sodium of 125 and a low bicarb of 16.  Mildly elevated lactic acid.  No obvious source of an infectious process and no signs of urinary tract infection.  Appears to have hypovolemia, given 1 L of IV fluids and will reevaluate.  Orthostatic blood pressures were positive with significant hypotension with standing.  Given a second liter of IV fluids.  Patient is outside of the window for TNK.  Concern for acute CVA.  Consulted hospitalist for admission.  PROCEDURES:  Critical Care performed: yes  .Critical Care  Performed by: Corena Herter, MD Authorized by: Corena Herter, MD   Critical care provider statement:    Critical care time (minutes):  45   Critical care time was exclusive of:  Separately billable procedures and treating other patients   Critical care was necessary to treat or prevent imminent or life-threatening deterioration of the following conditions:  Metabolic crisis and CNS failure or compromise   Critical care was time spent personally by me on the following activities:  Development of treatment plan  with patient or surrogate, discussions with consultants, evaluation of patient's response to treatment, examination of patient, ordering and review of laboratory studies, ordering and review of radiographic studies, ordering and performing treatments and interventions, pulse oximetry, re-evaluation of patient's condition and review of old charts   Patient's presentation is most consistent with acute presentation  with potential threat to life or bodily function.   MEDICATIONS ORDERED IN ED: Medications   stroke: early stages of recovery book (has no administration in time range)  0.9 %  sodium chloride infusion (has no administration in time range)  acetaminophen (TYLENOL) tablet 650 mg (has no administration in time range)    Or  acetaminophen (TYLENOL) 160 MG/5ML solution 650 mg (has no administration in time range)    Or  acetaminophen (TYLENOL) suppository 650 mg (has no administration in time range)  senna-docusate (Senokot-S) tablet 1 tablet (has no administration in time range)  enoxaparin (LOVENOX) injection 40 mg (has no administration in time range)  aspirin EC tablet 81 mg (has no administration in time range)  labetalol (NORMODYNE) injection 10 mg (has no administration in time range)  pantoprazole (PROTONIX) EC tablet 40 mg (has no administration in time range)  sodium chloride 0.9 % bolus 1,000 mL (1,000 mLs Intravenous New Bag/Given 05/08/23 1249)  sodium chloride 0.9 % bolus 1,000 mL (1,000 mLs Intravenous New Bag/Given 05/08/23 1344)  aspirin chewable tablet 324 mg (324 mg Oral Given 05/08/23 1357)    FINAL CLINICAL IMPRESSION(S) / ED DIAGNOSES   Final diagnoses:  Weakness  Hyponatremia  Cerebrovascular accident (CVA), unspecified mechanism (HCC)     Rx / DC Orders   ED Discharge Orders     None        Note:  This document was prepared using Dragon voice recognition software and may include unintentional dictation errors.   Corena Herter, MD 05/08/23  1659

## 2023-05-08 NOTE — Progress Notes (Signed)
       CROSS COVER NOTE  NAME: Christine Reyes MRN: [id number] DOB : [birth year] ATTENDING PHYSICIAN: Floydene Flock, MD    Date of Service   05/08/2023   HPI/Events of Note   MRI results called from [facility] MPRESSION: 1. Acute infarcts in the bilateral occipital lobes, left greater than right, which correlate with the hypodensity seen on the same-day CT. The bilateral occipital infarcts are associated with foci of hemosiderin deposition, which may represent thromboemboli. 2. Additional punctate acute infarcts in the right cerebellum and possible acute infarct in the right lateral pons.    Interventions   Assessment/Plan: Neurology management plavix and aspirin initiated. Echo already ordered. Lipids and A1C ordered - secondary risk factor modification No change in current management      Donnie Mesa NP [facility] Cross Cover 7pm-7am - check amion for availability Pager [phone]

## 2023-05-08 NOTE — Consult Note (Signed)
NEUROLOGY CONSULT NOTE   Date of service: May 08, 2023 Patient Name: Christine Reyes MRN:  [id number] DOB:  [birth year] Chief Complaint: "Confusion" Requesting Provider: Floydene Flock, MD  History of Present Illness  Christine Reyes is a 67 y.o. female  has a past medical history of Anxiety, Arthritis, Headache, Hypertension, Iron deficiency anemia (06/17/2018), Obesity, and Sleep apnea.   She presents today with confusion that started yesterday.She also complains of the sensation of generalized weakness she feels like she has problems with her memory as well.  She feels like she may have some trouble with her vision, but that has been going on for much longer than the past few weeks.   LKW: Wednesday Modified rankin score: 0-Completely asymptomatic and back to baseline post- stroke IV Thrombolysis: No, out of window EVT: No, out of window  NIHSS components Score: Comment  1a Level of Conscious 0[x]  1[]  2[]  3[]      1b LOC Questions 0[]  1[x]  2[]       1c LOC Commands 0[x]  1[]  2[]       2 Best Gaze 0[x]  1[]  2[]       3 Visual 0[x]  1[]  2[]  3[]      4 Facial Palsy 0[x]  1[]  2[]  3[]      5a Motor Arm - left 0[x]  1[]  2[]  3[]  4[]  UN[]    5b Motor Arm - Right 0[x]  1[]  2[]  3[]  4[]  UN[]    6a Motor Leg - Left 0[x]  1[]  2[]  3[]  4[]  UN[]    6b Motor Leg - Right 0[x]  1[]  2[]  3[]  4[]  UN[]    7 Limb Ataxia 0[x]  1[]  2[]  3[]  UN[]     8 Sensory 0[x]  1[]  2[]  UN[]      9 Best Language 0[x]  1[]  2[]  3[]      10 Dysarthria 0[x]  1[]  2[]  UN[]      11 Extinct. and Inattention 0[x]  1[]  2[]       TOTAL: 1    Past History   Past Medical History:  Diagnosis Date   Anxiety    Arthritis    Headache    Hypertension    Iron deficiency anemia 06/17/2018   Obesity    Sleep apnea     Past Surgical History:  Procedure Laterality Date   ABDOMINAL HYSTERECTOMY     BREAST EXCISIONAL BIOPSY Right 1989   CHOLECYSTECTOMY     COLONOSCOPY WITH ESOPHAGOGASTRODUODENOSCOPY (EGD)     csection x2     DILATION AND CURETTAGE  OF UTERUS     ESOPHAGOGASTRODUODENOSCOPY (EGD) WITH PROPOFOL N/A 12/14/2015   Procedure: ESOPHAGOGASTRODUODENOSCOPY (EGD) WITH PROPOFOL;  Surgeon: Scot Jun, MD;  Location: [facility] ENDOSCOPY;  Service: Endoscopy;  Laterality: N/A;   GASTRIC BYPASS     JOINT REPLACEMENT     left knee surgery     TOTAL KNEE ARTHROPLASTY Right 06/22/2017   Procedure: TOTAL KNEE ARTHROPLASTY;  Surgeon: Lyndle Herrlich, MD;  Location: [facility] ORS;  Service: Orthopedics;  Laterality: Right;    Family History: Family History  Problem Relation Age of Onset   Hypertension Mother    Arthritis Mother    Cancer Father    Bipolar disorder Daughter    Breast cancer Neg Hx     Social History  reports that she quit smoking about 7 years ago. Her smoking use included cigarettes. She has quit using smokeless tobacco. She reports current alcohol use of about 2.0 standard drinks of alcohol per week. She reports that she does not use drugs.  Allergies  Allergen Reactions   Morphine And Codeine Itching  Tramadol Itching    Medications   Current Facility-Administered Medications:    [START ON 05/09/2023]  stroke: early stages of recovery book, , Does not apply, Once, Floydene Flock, MD   0.9 %  sodium chloride infusion, , Intravenous, Continuous, Alvester Morin Francoise Schaumann, MD   acetaminophen (TYLENOL) tablet 650 mg, 650 mg, Oral, Q4H PRN, 650 mg at 05/08/23 1926 **OR** acetaminophen (TYLENOL) 160 MG/5ML solution 650 mg, 650 mg, Per Tube, Q4H PRN **OR** acetaminophen (TYLENOL) suppository 650 mg, 650 mg, Rectal, Q4H PRN, Floydene Flock, MD   [START ON 05/09/2023] aspirin EC tablet 81 mg, 81 mg, Oral, Daily, Floydene Flock, MD   enoxaparin (LOVENOX) injection 40 mg, 40 mg, Subcutaneous, Q24H, Floydene Flock, MD, 40 mg at 05/08/23 1757   labetalol (NORMODYNE) injection 10 mg, 10 mg, Intravenous, Q2H PRN, Floydene Flock, MD   pantoprazole (PROTONIX) EC tablet 40 mg, 40 mg, Oral, Daily, Floydene Flock, MD, 40 mg at  05/08/23 1757   senna-docusate (Senokot-S) tablet 1 tablet, 1 tablet, Oral, QHS PRN, Floydene Flock, MD  Current Outpatient Medications:    Aspirin-Salicylamide-Caffeine (ARTHRITIS STRENGTH BC POWDER PO), Take by mouth., Disp: , Rfl:    gabapentin (NEURONTIN) 300 MG capsule, Take 1 capsule (300 mg total) by mouth 2 (two) times daily., Disp: 60 capsule, Rfl: 2   Iron-Vitamin C 65-125 MG TABS, Take 1 tablet by mouth daily., Disp: 30 tablet, Rfl: 3   naloxone (NARCAN) 4 MG/0.1ML LIQD nasal spray kit, Inhale 4 mg into the lungs as needed., Disp: , Rfl:    olmesartan-hydrochlorothiazide (BENICAR HCT) 40-25 MG tablet, Take 1 tablet by mouth daily., Disp: , Rfl: 5   ondansetron (ZOFRAN-ODT) 4 MG disintegrating tablet, Take 1 tablet (4 mg total) by mouth every 8 (eight) hours as needed for nausea or vomiting., Disp: 20 tablet, Rfl: 0   Oxycodone HCl 20 MG TABS, Take 20 mg by mouth 2 (two) times daily as needed., Disp: , Rfl:    pantoprazole (PROTONIX) 40 MG tablet, Take 40 mg by mouth 2 (two) times daily as needed (heartburn). , Disp: , Rfl:    dicyclomine (BENTYL) 20 MG tablet, Take 1 tablet (20 mg total) by mouth 4 (four) times daily -  before meals and at bedtime for 7 days. (Patient not taking: Reported on 05/08/2023), Disp: 28 tablet, Rfl: 0   ibuprofen (ADVIL) 600 MG tablet, Take 1 tablet (600 mg total) by mouth every 8 (eight) hours as needed for moderate pain. (Patient not taking: Reported on 05/08/2023), Disp: 20 tablet, Rfl: 0   Oxycodone HCl 20 MG TABS, Take by mouth. (Patient not taking: Reported on 02/19/2021), Disp: , Rfl:   Vitals   Vitals:   05/08/23 1535 05/08/23 1537 05/08/23 1830 05/08/23 1922  BP:   111/72 118/72  Pulse:   80 74  Resp:   17 19  Temp:    97.9 F (36.6 C)  TempSrc:    Oral  SpO2:   98% 99%  Weight: 83.4 kg     Height:  4\' 9"  (1.448 m)      Body mass index is 39.77 kg/m.  Physical Exam   Constitutional: Appears well-developed and  well-nourished.  Neurologic Examination    Neuro: Mental Status: She gives the month as January, but is able to be her age.  She is not able to give me any year.  She does have a she is in the hospital. Cranial Nerves: II: She is able to count  fingers in all four fields. Pupils are equal, round, and reactive to light.   III,IV, VI: EOMI without ptosis or diploplia.  V: Facial sensation is symmetric to temperature VII: Facial movement is symmetric.  VIII: hearing is intact to voice X: Uvula elevates symmetrically XII: tongue is midline without atrophy or fasciculations.  Motor: Tone is normal. Bulk is normal. 5/5 strength was present in all four extremities. Sensory: Sensation is symmetric to light touch and temperature in the arms and legs. Cerebellar: FNF without clear ataxia    Labs/Imaging/Neurodiagnostic studies   CBC:  Recent Labs  Lab 05/18/2023 0926  WBC 6.9  HGB 11.8*  HCT 36.9  MCV 99.5  PLT 402*   Basic Metabolic Panel:  Lab Results  Component Value Date   NA 142 May 18, 2023   NA 142 2023-05-18   K 4.8 May 18, 2023   CO2 16 (L) 2023-05-18   GLUCOSE 72 2023-05-18   BUN 18 05/18/2023   CREATININE 0.85 May 18, 2023   CALCIUM 7.1 (L) 05-18-2023   GFRNONAA >60 05/18/2023   GFRAA >60 11/04/2018   Lipid Panel: No results found for: "LDLCALC" HgbA1c: No results found for: "HGBA1C" Urine Drug Screen: No results found for: "LABOPIA", "COCAINSCRNUR", "LABBENZ", "AMPHETMU", "THCU", "LABBARB"  Alcohol Level No results found for: "ETH" INR  Lab Results  Component Value Date   INR 1.2 01/09/2021   APTT  Lab Results  Component Value Date   APTT 29 01/09/2021   AED levels: No results found for: "PHENYTOIN", "ZONISAMIDE", "LAMOTRIGINE", "LEVETIRACETA"  CT Head without contrast(Personally reviewed): Subacute appearing stroke in the left parieto-occipital lobe  CT angio without any clear etiology for stroke  ASSESSMENT   Christine Reyes is a 67 y.o. female   has a past medical history of Anxiety, Arthritis, Headache, Hypertension, Iron deficiency anemia (06/17/2018), Obesity, and Sleep apnea.  She has a subacute appearing stroke on CT which fits with her confusion.  She will need to be admitted for therapy evaluation as well as secondary risk factor modification.   RECOMMENDATIONS  Echo, telemetry Lipids, A1c MRI brain Plavix 75 mg daily after 300 mg load Aspirin 81 mg daily PT, OT, ST ______________________________________________________________________    Signed, Ritta Slot, MD [facility]

## 2023-05-08 NOTE — Assessment & Plan Note (Signed)
 Allow for permissive HTN in setting of CVA evaluation  Prn IV labetalol for SBP>220 or DBP>110

## 2023-05-09 ENCOUNTER — Inpatient Hospital Stay: Payer: 59

## 2023-05-09 DIAGNOSIS — I6349 Cerebral infarction due to embolism of other cerebral artery: Secondary | ICD-10-CM

## 2023-05-09 LAB — LIPID PANEL
Cholesterol: 132 mg/dL (ref 0–200)
HDL: 31 mg/dL — ABNORMAL LOW (ref >40)
LDL Cholesterol: 89 mg/dL (ref 0–99)
Total CHOL/HDL Ratio: 4.3 {ratio}
Triglycerides: 60 mg/dL (ref <150)
VLDL: 12 mg/dL (ref 0–40)

## 2023-05-09 LAB — SODIUM: Sodium: 137 mmol/L (ref 135–145)

## 2023-05-09 MED ORDER — BUTALBITAL-APAP-CAFFEINE 50-325-40 MG PO TABS
1.0000 | ORAL_TABLET | Freq: Once | ORAL | Status: AC
  Administered 2023-05-09: 1 via ORAL
  Filled 2023-05-09: qty 1

## 2023-05-09 MED ORDER — DICLOFENAC SODIUM 1 % EX GEL
4.0000 g | Freq: Four times a day (QID) | CUTANEOUS | Status: DC | PRN
  Administered 2023-05-09: 4 g via TOPICAL
  Filled 2023-05-09: qty 100

## 2023-05-09 MED ORDER — BUTALBITAL-APAP-CAFFEINE 50-325-40 MG PO TABS
1.0000 | ORAL_TABLET | Freq: Once | ORAL | Status: AC
  Administered 2023-05-10: 1 via ORAL
  Filled 2023-05-09: qty 1

## 2023-05-09 MED ORDER — METHOCARBAMOL 500 MG PO TABS
500.0000 mg | ORAL_TABLET | Freq: Once | ORAL | Status: AC
  Administered 2023-05-09: 500 mg via ORAL
  Filled 2023-05-09: qty 1

## 2023-05-09 MED ORDER — ATORVASTATIN CALCIUM 20 MG PO TABS
40.0000 mg | ORAL_TABLET | Freq: Every day | ORAL | Status: DC
  Administered 2023-05-09 – 2023-05-14 (×6): 40 mg via ORAL
  Filled 2023-05-09 (×6): qty 2

## 2023-05-09 NOTE — Evaluation (Signed)
Physical Therapy Evaluation Patient Details Name: Christine Reyes MRN: [id number] DOB: 1955/07/10 Today's Date: 05/09/2023  History of Present Illness  Pt is a 67 y.o. female presenting to hospital 05/08/23 with c/o weakness; episode of syncope (hit her head; mild HA since fall).  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  Pt admitted with CVA, hyponatremia, and hypertensive disorder.  PMH includes htn, obesity s/p gastric bypass, R TKA 2019, L knee sx.  Clinical Impression  Prior to recent medical concerns, pt was independent with ambulation; lives with her mother in 1 level home with ramp to enter.  6/10 HA, neck, and R shoulder pain reported during session (nurse notified).  Currently pt is SBA with bed mobility; min assist with transfers; and min assist with ambulation 12 feet with RW use.  Pt reporting B LE weakness and feeling unsteady during sessions activities.  Pt would currently benefit from skilled PT to address noted impairments and functional limitations (see below for any additional details).  Upon hospital discharge, pt would benefit from ongoing therapy.     If plan is discharge home, recommend the following: A little help with walking and/or transfers;A little help with bathing/dressing/bathroom;Assistance with cooking/housework;Assist for transportation;Help with stairs or ramp for entrance   Can travel by private vehicle        Equipment Recommendations Rolling walker (2 wheels) (youth sized)  Recommendations for Other Services       Functional Status Assessment Patient has had a recent decline in their functional status and demonstrates the ability to make significant improvements in function in a reasonable and predictable amount of time.     Precautions / Restrictions Precautions Precautions: Fall Restrictions Weight Bearing Restrictions Per Provider Order: No       Mobility  Bed Mobility Overal bed mobility: Needs Assistance Bed Mobility: Supine to Sit, Sit to Supine     Supine to sit: Supervision, HOB elevated Sit to supine: Supervision, HOB elevated   General bed mobility comments: increased effort to perform on own    Transfers Overall transfer level: Needs assistance Equipment used: Rolling walker (2 wheels) Transfers: Sit to/from Stand, Bed to chair/wheelchair/BSC Sit to Stand: Min assist   Step pivot transfers: Min assist (BSC to bed no AD use; recliner to bed with RW use)       General transfer comment: x1 trial standing from bed and x1 trial standing from recliner (vc's for UE/LE placement and overall technique)    Ambulation/Gait Ambulation/Gait assistance: Min assist Gait Distance (Feet): 12 Feet Assistive device: Rolling walker (2 wheels) Gait Pattern/deviations: Decreased step length - right, Decreased step length - left Gait velocity: decreased     General Gait Details: mild decreased stance time L LE; assist to steady  Careers information officer     Tilt Bed    Modified Rankin (Stroke Patients Only)       Balance Overall balance assessment: Needs assistance Sitting-balance support: No upper extremity supported, Feet supported Sitting balance-Leahy Scale: Good Sitting balance - Comments: steady reaching within BOS   Standing balance support: Bilateral upper extremity supported, During functional activity, Reliant on assistive device for balance Standing balance-Leahy Scale: Poor Standing balance comment: assist to steady with dynamic standing activities  Pertinent Vitals/Pain Pain Assessment Pain Assessment: 0-10 Pain Score: 6  Pain Location: HA, neck, and R shoulder Pain Descriptors / Indicators: Headache, Aching, Sore Pain Intervention(s): Limited activity within patient's tolerance, Monitored during session, Repositioned, Other (comment)  (RN notified regarding pt's pain) HR 62-86 bpm during sessions activities and BP 138/77 end of session at rest; no c/o dizziness during session.    Home Living Family/patient expects to be discharged to:: Private residence Living Arrangements: Parent (Pt's mom) Available Help at Discharge: Family;Available PRN/intermittently Type of Home: House Home Access: Stairs to enter;Ramped entrance Entrance Stairs-Rails: Can reach both Entrance Stairs-Number of Steps: 3   Home Layout: One level Home Equipment: Grab bars - toilet      Prior Function Prior Level of Function : Independent/Modified Independent;History of Falls (last six months)             Mobility Comments: Ambulatory with no AD. 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     Extremity/Trunk Assessment   Upper Extremity Assessment Upper Extremity Assessment: Defer to OT evaluation    Lower Extremity Assessment Lower Extremity Assessment: LLE deficits/detail (R LE WFL; intact B LE light touch and proprioception) LLE Deficits / Details: hip flexion 3-/5; knee flexion 3+/5; knee extension 3+/5; DF 3/5    Cervical / Trunk Assessment Cervical / Trunk Assessment: Normal  Communication   Communication Communication: No apparent difficulties Cueing Techniques: Verbal cues  Cognition Arousal: Alert Behavior During Therapy: WFL for tasks assessed/performed Overall Cognitive Status: Within Functional Limits for tasks assessed                                          General Comments  Pt agreeable to PT session.    Exercises  Transfer/gait training   Assessment/Plan    PT Assessment Patient needs continued PT services  PT Problem List Decreased strength;Decreased balance;Decreased mobility;Decreased knowledge of use of DME;Decreased knowledge of precautions;Pain       PT Treatment Interventions DME instruction;Gait  training;Functional mobility training;Therapeutic activities;Therapeutic exercise;Balance training;Patient/family education    PT Goals (Current goals can be found in the Care Plan section)  Acute Rehab PT Goals Patient Stated Goal: to improve walking PT Goal Formulation: With patient Time For Goal Achievement: 05/23/23 Potential to Achieve Goals: Good    Frequency Min 1X/week     Co-evaluation               AM-PAC PT "6 Clicks" Mobility  Outcome Measure Help needed turning from your back to your side while in a flat bed without using bedrails?: None Help needed moving from lying on your back to sitting on the side of a flat bed without using bedrails?: A Little Help needed moving to and from a bed to a chair (including a wheelchair)?: A Little Help needed standing up from a chair using your arms (e.g., wheelchair or bedside chair)?: A Little Help needed to walk in hospital room?: A Little Help needed climbing 3-5 steps with a railing? : A Lot 6 Click Score: 18    End of Session Equipment Utilized During Treatment: Gait belt Activity Tolerance: Patient tolerated treatment well Patient left: in bed;with call bell/phone within reach;with bed alarm set;Other (comment) (B LE's elevated via pillow with heels floating; transport present to take pt for testing) Nurse Communication: Mobility status;Precautions;Patient requests pain meds  PT Visit Diagnosis: Unsteadiness on feet (R26.81);Other abnormalities of gait and mobility (R26.89);Muscle weakness (generalized) (M62.81);Hemiplegia and hemiparesis Hemiplegia - Right/Left: Left Hemiplegia - caused by: Cerebral infarction    Time: 1138-1207 PT Time Calculation (min) (ACUTE ONLY): 29 min   Charges:   PT Evaluation $PT Eval Low Complexity: 1 Low PT Treatments $Therapeutic Activity: 8-22 mins PT General Charges $$ ACUTE PT VISIT: 1 Visit        Hendricks Limes, PT 05/09/23, 1:13 PM

## 2023-05-09 NOTE — Progress Notes (Signed)
Inpatient Rehab Admissions Coordinator Note:   Per therapy patient was screened for CIR candidacy by Vartan Kerins Luvenia Starch, CCC-SLP. At this time, pt appears to be a potential candidate for CIR. I will place an order for rehab consult for full assessment, per our protocol.  Please contact me any with questions.Christine Phoenix, MS, CCC-SLP Admissions Coordinator [phone] 05/09/23 5:45 PM

## 2023-05-09 NOTE — Plan of Care (Signed)
  Problem: Education: Goal: Knowledge of disease or condition will improve Outcome: Progressing   Problem: Ischemic Stroke/TIA Tissue Perfusion: Goal: Complications of ischemic stroke/TIA will be minimized Outcome: Progressing   Problem: Coping: Goal: Will verbalize positive feelings about self Outcome: Progressing   Problem: Health Behavior/Discharge Planning: Goal: Ability to manage health-related needs will improve Outcome: Progressing   Problem: Self-Care: Goal: Ability to participate in self-care as condition permits will improve Outcome: Progressing   Problem: Education: Goal: Knowledge of General Education information will improve Description: Including pain rating scale, medication(s)/side effects and non-pharmacologic comfort measures Outcome: Progressing   Problem: Health Behavior/Discharge Planning: Goal: Ability to manage health-related needs will improve Outcome: Progressing   Problem: Activity: Goal: Risk for activity intolerance will decrease Outcome: Progressing   Problem: Nutrition: Goal: Adequate nutrition will be maintained Outcome: Progressing   Problem: Coping: Goal: Level of anxiety will decrease Outcome: Progressing   Problem: Elimination: Goal: Will not experience complications related to bowel motility Outcome: Progressing   Problem: Pain Management: Goal: General experience of comfort will improve Outcome: Progressing   Problem: Safety: Goal: Ability to remain free from injury will improve Outcome: Progressing   Problem: Skin Integrity: Goal: Risk for impaired skin integrity will decrease Outcome: Progressing

## 2023-05-09 NOTE — Progress Notes (Signed)
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/2019   Lumbar facet arthropathy 01/18/2019   Lumbar spondylosis 01/18/2019   Lumbar degenerative disc disease 01/18/2019   Primary osteoarthritis of left knee 01/18/2019   Chronic pain syndrome 01/18/2019   Acute diastolic congestive heart failure (HCC) 01/18/2019   Iron deficiency anemia 06/17/2018   Status post bariatric surgery 03/31/2018   BMI 33.0-33.9,adult 03/31/2018   History of total knee arthroplasty 07/15/2017   History of total right knee replacement 06/22/2017   Gastroesophageal reflux disease without esophagitis 03/20/2016   Functional diarrhea 03/20/2016   Pleural effusion, right 03/19/2016   Morbid obesity (HCC) 03/06/2016   Hypertensive disorder 07/24/2015   Obstructive sleep apnea on CPAP 07/24/2015   Gallstone 07/24/2015   Primary osteoarthritis of both knees 07/04/2015   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   Arthritis, senescent 09/30/2013   Low back pain 09/30/2013   Past Medical History:  Past Medical History:  Diagnosis Date   Anxiety    Arthritis    Headache    Hypertension    Iron deficiency anemia 06/17/2018   Obesity    Sleep apnea    Past Surgical History:  Past Surgical History:  Procedure Laterality Date   ABDOMINAL HYSTERECTOMY     BREAST EXCISIONAL BIOPSY Right 1989   CHOLECYSTECTOMY     COLONOSCOPY WITH ESOPHAGOGASTRODUODENOSCOPY (EGD)     csection x2     DILATION AND CURETTAGE OF UTERUS     ESOPHAGOGASTRODUODENOSCOPY (EGD) WITH PROPOFOL N/A 12/14/2015   Procedure:  ESOPHAGOGASTRODUODENOSCOPY (EGD) WITH PROPOFOL;  Surgeon: Scot Jun, MD;  Location: [facility] ENDOSCOPY;  Service: Endoscopy;  Laterality: N/A;   GASTRIC BYPASS     JOINT REPLACEMENT     left knee surgery     TOTAL KNEE ARTHROPLASTY Right 06/22/2017   Procedure: TOTAL KNEE ARTHROPLASTY;  Surgeon: Lyndle Herrlich, MD;  Location: [facility] ORS;  Service: Orthopedics;  Laterality: Right;   HPI:  PMHx Anxiety, Arthritis, Headache, Hypertension, Iron deficiency anemia (06/17/2018), Obesity, and Sleep apnea.  She has a subacute appearing stroke on CT. MRI 05/08/23 demonstrates acute infarcts in the bilateral occipital lobes and additional punctate acute infarcts in the right cerebellum and  possible acute infarct in the right lateral pons.   Assessment / Plan / Recommendation Clinical Impression   Ms. Detar presents with mild cognitive-communication deficits per today's evaluation. Pt reports c/o memory challenges, ongoing and visual changes including R neglect. During initial conversation, pt noted to require extended time for organization of thoughts/ideas which is change from baseline per pt report. Prior to hospitalization, pt reports successfully managing her and her mothers care/schedule/medications/finances/etc. Pt completes Mini Mental Status Examination scoring 27/30 which is WNL. However, SLP notes extended time required for provision of answers for orientation and used sign in room for stating name of hospital. Pt endorses she is local to area and presumably would know name of hospital d/t reported visits previously with mother. Unable to complete serial 7s, did successfully spell "world" backwards as alternative. Reading impaired d/t R neglect stating only sees word "close." Visual and verbal cues aided in visual scanning to read rest but did not follow  instruction. Unable to copy design, appears 2/2 visual deficits. Pt is endorsing change in cognition but appears functional for acute stay. ST  recommends f/u at next venue to facilitate return to baseline and ability to successfully complete moderately complex cognitive task in home environment.     SLP Assessment  SLP Recommendation/Assessment: All further Speech Lanaguage Pathology  needs can be addressed in the next venue of care SLP Visit Diagnosis: Cognitive communication deficit (R41.841)    Recommendations for follow up therapy are one component of a multi-disciplinary discharge planning process, led by the attending physician.  Recommendations may be updated based on patient status, additional functional criteria and insurance authorization.    Follow Up Recommendations  Other (comment) (HH vs OP per OT/PT recommendations)    Assistance Recommended at Discharge  Intermittent Supervision/Assistance  Functional Status Assessment Patient has had a recent decline in their functional status and demonstrates the ability to make significant improvements in function in a reasonable and predictable amount of time.  Frequency and Duration           SLP Evaluation Cognition  Overall Cognitive Status: Impaired/Different from baseline Arousal/Alertness: Awake/alert Orientation Level: Oriented X4 Memory: Impaired Memory Impairment: Retrieval deficit Awareness: Appears intact Problem Solving: Appears intact Executive Function: Organizing;Reasoning Reasoning: Appears intact Organizing: Impaired Organizing Impairment: Functional complex;Verbal complex Safety/Judgment: Appears intact       Comprehension  Auditory Comprehension Overall Auditory Comprehension: Appears within functional limits for tasks assessed Reading Comprehension Reading Status: Impaired Interfering Components: Right neglect/inattention;Visual perceptual Effective Techniques: Verbal cueing;Visual cueing    Expression Expression Primary Mode of Expression: Verbal Verbal Expression Overall Verbal Expression: Impaired Level of Generative/Spontaneous  Verbalization: Music therapist Expression Written Expression: Within Functional Limits   Oral / Motor  Oral Motor/Sensory Function Overall Oral Motor/Sensory Function: Within functional limits Motor Speech Overall Motor Speech: Appears within functional limits for tasks assessed            BONNETTE WESSNER 05/09/2023, 10:48 AM

## 2023-05-09 NOTE — Plan of Care (Signed)
  Problem: Education: Goal: Knowledge of disease or condition will improve Outcome: Progressing Goal: Knowledge of secondary prevention will improve (MUST DOCUMENT ALL) Outcome: Progressing Goal: Knowledge of patient specific risk factors will improve Loraine Leriche N/A or DELETE if not current risk factor) Outcome: Progressing   Problem: Ischemic Stroke/TIA Tissue Perfusion: Goal: Complications of ischemic stroke/TIA will be minimized Outcome: Progressing   Problem: Coping: Goal: Will verbalize positive feelings about self Outcome: Progressing   Problem: Coping: Goal: Will verbalize positive feelings about self Outcome: Progressing Goal: Will identify appropriate support needs Outcome: Progressing   Problem: Health Behavior/Discharge Planning: Goal: Ability to manage health-related needs will improve Outcome: Progressing Goal: Goals will be collaboratively established with patient/family Outcome: Progressing   Problem: Self-Care: Goal: Ability to participate in self-care as condition permits will improve Outcome: Progressing Goal: Verbalization of feelings and concerns over difficulty with self-care will improve Outcome: Progressing Goal: Ability to communicate needs accurately will improve Outcome: Progressing   Problem: Nutrition: Goal: Risk of aspiration will decrease Outcome: Progressing Goal: Dietary intake will improve Outcome: Progressing   Problem: Education: Goal: Knowledge of General Education information will improve Description: Including pain rating scale, medication(s)/side effects and non-pharmacologic comfort measures Outcome: Progressing   Problem: Health Behavior/Discharge Planning: Goal: Ability to manage health-related needs will improve Outcome: Progressing   Problem: Clinical Measurements: Goal: Ability to maintain clinical measurements within normal limits will improve Outcome: Progressing Goal: Will remain free from infection Outcome:  Progressing Goal: Diagnostic test results will improve Outcome: Progressing Goal: Respiratory complications will improve Outcome: Progressing Goal: Cardiovascular complication will be avoided Outcome: Progressing   Problem: Activity: Goal: Risk for activity intolerance will decrease Outcome: Progressing   Problem: Nutrition: Goal: Adequate nutrition will be maintained Outcome: Progressing   Problem: Coping: Goal: Level of anxiety will decrease Outcome: Progressing   Problem: Elimination: Goal: Will not experience complications related to bowel motility Outcome: Progressing Goal: Will not experience complications related to urinary retention Outcome: Progressing   Problem: Pain Management: Goal: General experience of comfort will improve Outcome: Progressing   Problem: Safety: Goal: Ability to remain free from injury will improve Outcome: Progressing   Problem: Skin Integrity: Goal: Risk for impaired skin integrity will decrease Outcome: Progressing

## 2023-05-09 NOTE — Evaluation (Signed)
Occupational Therapy Evaluation Patient Details Name: Christine Reyes MRN: [id number] DOB: 11/29/1955 Today's Date: 05/09/2023   History of Present Illness Pt is a 67 y.o. female presenting to hospital 05/08/23 with c/o weakness; episode of syncope (hit her head; mild HA since fall).  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  Pt admitted with CVA, hyponatremia, and hypertensive disorder.  PMH includes htn, obesity s/p gastric bypass, R TKA 2019, L knee sx.   Clinical Impression   Chart reviewed, pt greeted in bed, oriented to self, place, not oriented to date/situation. Increased time for processing throughout. PTA pt reports she is MOD I in ADL/IADL. Pt presents with deficits in LUE function, strength, activity tolerance, balance, vision affecting safe and optimal ADL completion. Pt performed bed mobility with supervision, STS with MIN A, short amb transfer to bsc with MIN-MOD A via HHA, toileting with MIN A, amb in room with HHA with MIN -MOD A approx 6'. Pt endorses dizziness throughout, vitals taken and can be seen below. Pt will benefit from intensive rehab to facilitate optimal ADL performance, return to PLOF. Pt is left in bed, all needs met. OT will follow acutely.   Supine: 130/69 (MAP 88) HR 72bpm Sitting: 141/92 (MAP 108) HR 70 bpm  Standing 157/77 (MAP 102) HR 69 bpm      If plan is discharge home, recommend the following: A little help with bathing/dressing/bathroom;A lot of help with walking and/or transfers;Assistance with cooking/housework;Direct supervision/assist for medications management;Assist for transportation;Help with stairs or ramp for entrance    Functional Status Assessment  Patient has had a recent decline in their functional status and demonstrates the ability to make significant improvements in function in a reasonable and predictable amount of time.   Equipment Recommendations  Other (comment) (defer to next venue of care)    Recommendations for Other Services Rehab consult     Precautions / Restrictions Precautions Precautions: Fall Restrictions Weight Bearing Restrictions Per Provider Order: No      Mobility Bed Mobility Overal bed mobility: Needs Assistance Bed Mobility: Supine to Sit, Sit to Supine     Supine to sit: Supervision, HOB elevated Sit to supine: Supervision, HOB elevated        Transfers                          Balance Overall balance assessment: Needs assistance Sitting-balance support: No upper extremity supported, Feet supported Sitting balance-Leahy Scale: Good     Standing balance support: Single extremity supported Standing balance-Leahy Scale: Poor                             ADL either performed or assessed with clinical judgement   ADL Overall ADL's : Needs assistance/impaired Eating/Feeding: Supervision/ safety;Sitting   Grooming: Wash/dry face;Sitting;Set up               Lower Body Dressing: Moderate assistance;Sitting/lateral leans   Toilet Transfer: Minimal assistance;Moderate assistance;BSC/3in1 Statistician Details (indicate cue type and reason): HHA to bedside commode, short amb transfer Toileting- Clothing Manipulation and Hygiene: Minimal assistance;Sitting/lateral lean       Functional mobility during ADLs:  (approx 6' in room with MIN-MOD A with HHA, pt reports feeling dizzy therefore returned to seated on edge of bed)       Vision Baseline Vision/History: 1  Wears glasses Patient Visual Report: Blurring of vision;Eye fatigue/eye pain/headache Vision Assessment?: Yes;Vision impaired- to be further tested in functional context Eye Alignment: Within Functional Limits Ocular Range of Motion: Within Functional Limits Alignment/Gaze Preference: Within Defined Limits Tracking/Visual Pursuits: Able to track stimulus in all quads without  difficulty Additional Comments: clock drawing task- able to draw a clock and correct time with increased time; Unable to read clock time on wall or board in front of her     Perception Perception: Within Functional Limits       Praxis Praxis:  (will continue to assess)       Pertinent Vitals/Pain Pain Assessment Pain Assessment: 0-10 Pain Score: 5  Pain Location: HA Pain Descriptors / Indicators: Headache Pain Intervention(s): Limited activity within patient's tolerance, Monitored during session, Repositioned     Extremity/Trunk Assessment Upper Extremity Assessment Upper Extremity Assessment: Left hand dominant;LUE deficits/detail (RUE grossly 4/5 throughout) LUE Deficits / Details: AROM: shoulder flexion approx 3/4 full AROM; elbow/wrist/hand appear WFL; MMT: shoulder flexion: 3/5, elbow flexion/extension 3+/5, wrist flexion/extension 3+/5; mildly impaired grip strength; sensation appears intact however will continue to assess; LUE Coordination: decreased fine motor;decreased gross motor   Lower Extremity Assessment Lower Extremity Assessment: Defer to PT evaluation LLE Deficits / Details: hip flexion 3-/5; knee flexion 3+/5; knee extension 3+/5; DF 3/5   Cervical / Trunk Assessment Cervical / Trunk Assessment: Normal   Communication Communication Communication: Difficulty following commands/understanding Following commands: Follows one step commands with increased time Cueing Techniques: Verbal cues   Cognition Arousal: Alert Behavior During Therapy: WFL for tasks assessed/performed Overall Cognitive Status: No family/caregiver present to determine baseline cognitive functioning Area of Impairment: Safety/judgement, Following commands, Awareness, Problem solving                       Following Commands: Follows one step commands with increased time   Awareness: Emergent Problem Solving: Requires verbal cues       General Comments       Exercises Other  Exercises Other Exercises: edu re: role of OT, role of rehab,discharge recommendations, importance of oob mobility, strategies to improve LUE function   Shoulder Instructions      Home Living Family/patient expects to be discharged to:: Private residence Living Arrangements: Parent (Pt's mom) Available Help at Discharge: Family;Available PRN/intermittently Type of Home: House Home Access: Stairs to enter;Ramped entrance Entrance Stairs-Number of Steps: 3 Entrance Stairs-Rails: Can reach both Home Layout: One level     Bathroom Shower/Tub: Chief Strategy Officer: Standard Bathroom Accessibility: Yes   Home Equipment: Grab bars - toilet      Lives With: Other (Comment) (elderly mother)    Prior Functioning/Environment Prior Level of Function : Independent/Modified Independent;History of Falls (last six months)             Mobility Comments: Ambulatory with no AD. 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        OT Problem List: Decreased strength;Decreased activity tolerance;Decreased range of motion;Decreased knowledge of use of DME or AE;Decreased safety awareness;Decreased cognition;Impaired balance (sitting and/or standing);Impaired vision/perception;Decreased knowledge of precautions;Impaired UE functional use      OT Treatment/Interventions: Self-care/ADL training;Therapeutic exercise;Patient/family education;Neuromuscular education;Balance training;Splinting;Energy conservation;Therapeutic activities;DME and/or AE instruction;Cognitive remediation/compensation    OT Goals(Current goals can be found in the care plan section) Acute Rehab OT Goals Patient Stated Goal: return to PLOF OT Goal Formulation: With patient Time For Goal  Achievement: 05/23/23 Potential to Achieve Goals: Good ADL Goals Pt Will Perform Grooming: with modified independence;sitting;standing Pt Will Perform Lower  Body Dressing: with modified independence;sit to/from stand;sitting/lateral leans Pt Will Transfer to Toilet: with modified independence;ambulating Pt Will Perform Toileting - Clothing Manipulation and hygiene: with modified independence;sitting/lateral leans;sit to/from stand Pt/caregiver will Perform Home Exercise Program: Left upper extremity;With written HEP provided;Increased strength  OT Frequency: Min 1X/week    Co-evaluation              AM-PAC OT "6 Clicks" Daily Activity     Outcome Measure Help from another person eating meals?: None Help from another person taking care of personal grooming?: None Help from another person toileting, which includes using toliet, bedpan, or urinal?: A Little Help from another person bathing (including washing, rinsing, drying)?: A Lot Help from another person to put on and taking off regular upper body clothing?: A Little Help from another person to put on and taking off regular lower body clothing?: A Lot 6 Click Score: 18   End of Session Equipment Utilized During Treatment: Gait belt  Activity Tolerance: Patient tolerated treatment well Patient left: in bed;with call bell/phone within reach;with bed alarm set  OT Visit Diagnosis: Other abnormalities of gait and mobility (R26.89);Muscle weakness (generalized) (M62.81);Other symptoms and signs involving the nervous system (R29.898);Hemiplegia and hemiparesis                Time: 9604-5409 OT Time Calculation (min): 25 min Charges:  OT General Charges $OT Visit: 1 Visit OT Evaluation $OT Eval Moderate Complexity: 1 Mod  Oleta Mouse, OTD OTR/L  05/09/23, 1:41 PM

## 2023-05-09 NOTE — Progress Notes (Signed)
PROGRESS NOTE  Christine Reyes    DOB: Jun 01, 1955, 67 y.o.  GNF:[id number]    Code Status: Full Code   DOA: 05/08/2023   LOS: 1   Brief hospital course  Christine Reyes is a 67 y.o. 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   Er course: afebrile, blood pressure 130s to 150s over 70s to 110s.  Satting well on room air.  White count 6.9, hemoglobin 11.8, platelets 402, lactate 0.4.  Troponin within normal limits.  Urinalysis stable.  Creatinine 0.85.  Sodium 125.  Found to be orthostatic positive in the ER.  CT head with acute to subacute cortical infarcts in the left occipital lobe. MRI brain: multiple acute infarcts- bilateral occipital lobes, punctate right cerebellum and possible right lateral pons. CTA head and neck- no emergent large vessel occlusion.   LE doppler negative for DVT.  Echo is pending.  Neurology is consulted.   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 No DVT in LE doppler. No severe occlusive disease on head/neck CTA. Symptoms have improved and complains of visual field loss, confusion, and headache.  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 Not recommended to be on eliquis any more but she states that she takes it intermittently when she has knee pain. Last time was about 2 weeks ago. - normal DVT ppx at this time pending further embolic workup.      Hyponatremia- Na 125 on presentation- resolved  Gastroesophageal reflux disease without esophagitis Ppi    Hypertensive disorder Allow for permissive HTN in setting of CVA evaluation  Prn IV labetalol for SBP>220 or DBP>110  Body mass index is 39.77 kg/m.  VTE ppx: enoxaparin (LOVENOX) injection 40 mg Start: 05/08/23 1600  Diet:     Diet   Diet regular Room service appropriate? Yes; Fluid consistency: Thin   Consultants: Neurology   Subjective 05/09/23    Pt reports having a headache and visual field deficiencies that are intermittent. Denies nausea, focal weakness.    Objective   Vitals:   05/08/23 2016 05/08/23 2044 05/09/23 0434 05/09/23 0748  BP: (!) 129/53  120/66 139/79  Pulse: 66 68 64 71  Resp:   19 16  Temp: (!) 97.3 F (36.3 C)  98.5 F (36.9 C) 98.6 F (37 C)  TempSrc:    Oral  SpO2:  98% 100% 100%  Weight:      Height:        Intake/Output Summary (Last 24 hours) at 05/09/2023 1416 Last data filed at 05/08/2023 1449 Gross per 24 hour  Intake 8 ml  Output --  Net 8 ml   Filed Weights   05/08/23 1535  Weight: 83.4 kg    Physical Exam:  General: awake, alert, NAD HEENT: atraumatic, clear conjunctiva, anicteric sclera, MMM, hearing grossly normal Respiratory: normal respiratory effort. Cardiovascular: quick capillary refill, normal S1/S2, RRR, no JVD, murmurs Gastrointestinal: soft, NT, ND Nervous: A&O x3. no gross focal neurologic deficits, normal speech.  Acuity decreased and non-specific Extremities: moves all equally, no edema, normal tone Skin: dry, intact, normal temperature, normal color. No rashes, lesions or ulcers on exposed skin Psychiatry: normal mood, congruent affect  Labs   I have personally reviewed the following labs and imaging studies CBC    Component Value Date/Time   WBC 6.9 05/08/2023 0926   RBC 3.71 (L) 05/08/2023 0926   HGB 11.8 (L) 05/08/2023 0926   HCT 36.9 05/08/2023 0926   PLT 402 (H) 05/08/2023 0926   MCV  99.5 05/08/2023 0926   MCH 31.8 05/08/2023 0926   MCHC 32.0 05/08/2023 0926   RDW 13.3 05/08/2023 0926   LYMPHSABS 2.5 02/04/2021 1332   MONOABS 0.7 02/04/2021 1332   EOSABS 0.1 02/04/2021 1332   BASOSABS 0.0 02/04/2021 1332      Latest Ref Rng & Units 05/09/2023    2:54 AM 05/08/2023   10:51 PM 05/08/2023    5:30 PM  BMP  Sodium 135 - 145 mmol/L 137  138  142    142     US Venous Img Lower Bilateral (DVT) Result Date: 05/09/2023 CLINICAL DATA:  Lower extremity pain. History of prior left posterior tibial vein DVT in 2022. EXAM: BILATERAL LOWER EXTREMITY VENOUS DOPPLER ULTRASOUND TECHNIQUE: Gray-scale sonography with graded compression, as well as color Doppler and duplex ultrasound were performed to evaluate the lower extremity deep venous systems from the level of the common femoral vein and including the common femoral, femoral, profunda femoral, popliteal and calf veins including the posterior tibial, peroneal and gastrocnemius veins when visible. The superficial great saphenous vein was also interrogated. Spectral Doppler was utilized to evaluate flow at rest and with distal augmentation maneuvers in the common femoral, femoral and popliteal veins. COMPARISON:  Report from a prior lower extremity venous duplex study at [facility] dated 12/02/2020 FINDINGS: RIGHT LOWER EXTREMITY Common Femoral Vein: No evidence of thrombus. Normal compressibility, respiratory phasicity and response to augmentation. Saphenofemoral Junction: No evidence of thrombus. Normal compressibility and flow on color Doppler imaging. Profunda Femoral Vein: No evidence of thrombus. Normal compressibility and flow on color Doppler imaging. Femoral Vein: No evidence of thrombus. Normal compressibility, respiratory phasicity and response to augmentation. Popliteal Vein: No evidence of thrombus. Normal compressibility, respiratory phasicity and response to augmentation. Calf Veins: No evidence of thrombus. Normal  compressibility and flow on color Doppler imaging. Superficial Great Saphenous Vein: No evidence of thrombus. Normal compressibility. Venous Reflux:  None. Other Findings: No evidence of superficial thrombophlebitis or abnormal fluid collection. LEFT LOWER EXTREMITY Common Femoral Vein: No evidence of thrombus. Normal compressibility, respiratory phasicity and response to augmentation. Saphenofemoral Junction: No evidence of thrombus. Normal compressibility and flow on color Doppler imaging. Profunda Femoral Vein: No evidence of thrombus. Normal compressibility and flow on color Doppler imaging. Femoral Vein: No evidence of thrombus. Normal compressibility, respiratory phasicity and response to augmentation. Popliteal Vein: No evidence of thrombus. Normal compressibility, respiratory phasicity and response to augmentation. Calf Veins: No evidence of thrombus. No evidence of further left posterior tibial vein thrombus or visible chronic thrombus. Normal compressibility and flow on color Doppler imaging. Superficial Great Saphenous Vein: No evidence of thrombus. Normal compressibility. Venous Reflux:  None. Other Findings: No evidence of superficial thrombophlebitis or abnormal fluid collection. IMPRESSION: No evidence of deep venous thrombosis in either lower extremity. Resolution of left posterior tibial vein DVT. Electronically Signed   By: Irish Lack M.D.   On: 05/09/2023 13:00   MR BRAIN WO CONTRAST Result Date: 05/08/2023 CLINICAL  DATA:  Stroke suspected, weakness that started last night around 11 p.m. EXAM: MRI HEAD WITHOUT CONTRAST TECHNIQUE: Multiplanar, multiecho pulse sequences of the brain and surrounding structures were obtained without intravenous contrast. COMPARISON:  No prior MRI available, correlation is made with 05/08/2023 CT head FINDINGS: Brain: Restricted diffusion with ADC correlate in the left occipital lobe, both cortex and subcortical white matter (series 9, images 18-27), which  correlates with the hypodensity seen on the same-day CT. Additional smaller area of restricted diffusion with ADC correlate in the right occipital cortex (series 9, images 25-29). The bilateral occipital infarcts are associated with foci of hemosiderin deposition (series 13, image 32 on the right and 25 on the left), which may represent thromboemboli. These areas are associated with increased T2 hyperintense signal. Additional punctate foci of restricted diffusion in the right cerebellum (series 9, images 17-19 and 21). Possible restricted diffusion with ADC correlate in the right lateral pons (series 9, image 19). No acute hemorrhage, mass, mass effect, or midline shift. No hydrocephalus or extra-axial collection. Pituitary and craniocervical junction within normal limits. Remote infarcts in the left cerebellum. Cerebral volume is normal for age. Vascular: Normal arterial flow voids. Skull and upper cervical spine: Normal marrow signal. Sinuses/Orbits: Clear paranasal sinuses. No acute finding in the orbits. Other: The mastoid air cells are well aerated. IMPRESSION: 1. Acute infarcts in the bilateral occipital lobes, left greater than right, which correlate with the hypodensity seen on the same-day CT. The bilateral occipital infarcts are associated with foci of hemosiderin deposition, which may represent thromboemboli. 2. Additional punctate acute infarcts in the right cerebellum and possible acute infarct in the right lateral pons. These results will be called to the ordering clinician or representative by the Radiologist Assistant, and communication documented in the PACS or [employer]. Electronically Signed   By: Wiliam Ke M.D.   On: 05/08/2023 21:57   CT ANGIO HEAD NECK W WO CM Result Date: 05/08/2023 CLINICAL DATA:  Weakness beginning last night.  Syncopal episode. EXAM: CT ANGIOGRAPHY HEAD AND NECK WITH AND WITHOUT CONTRAST TECHNIQUE: Multidetector CT imaging of the head and neck was performed  using the standard protocol during bolus administration of intravenous contrast. Multiplanar CT image reconstructions and MIPs were obtained to evaluate the vascular anatomy. Carotid stenosis measurements (when applicable) are obtained utilizing NASCET criteria, using the distal internal carotid diameter as the denominator. RADIATION DOSE REDUCTION: This exam was performed according to the departmental dose-optimization program which includes automated exposure control, adjustment of the mA and/or kV according to patient size and/or use of iterative reconstruction technique. CONTRAST:  75mL OMNIPAQUE IOHEXOL 350 MG/ML SOLN COMPARISON:  CT head without contrast 05/08/2023 and 01/09/2021. FINDINGS: CTA NECK FINDINGS Aortic arch: Atherosclerotic calcifications are present in the aortic arch. No aneurysm or stenosis is present. Great vessel origins are within normal limits. Right carotid system: The right common carotid artery is within normal limits. The bifurcation is unremarkable. Mild tortuosity of the cervical right ICA is present without significant stenosis. Left carotid system: The left common carotid artery is within normal limits. Minimal calcification is present at the bifurcation. Mild tortuosity of the cervical left ICA is present without significant stenosis. Vertebral arteries: The left vertebral artery is slightly dominant. Both vertebral arteries originate from the subclavian arteries without significant stenosis. No significant stenosis is present in either vertebral artery in the neck. Skeleton: Mild degenerative changes are present cervical spine. Vertebral body heights and alignment are normal. No focal osseous lesions are present. Other neck:  Soft tissues the neck are otherwise unremarkable. Salivary glands are within normal limits. Thyroid is normal. No significant adenopathy is present. No focal mucosal or submucosal lesions are present. Upper chest: The lung apices are clear. The thoracic inlet  is within normal limits. Review of the MIP images confirms the above findings CTA HEAD FINDINGS Anterior circulation: Atherosclerotic calcifications are present within the cavernous internal carotid arteries bilaterally without focal stenosis through the ICA termini. The A1 and M1 segments are normal. The anterior communicating artery is patent. No definite anterior communicating artery is present. MCA bifurcations are within normal limits bilaterally. The ACA and MCA branch vessels are normal. No aneurysm is present. Posterior circulation: The PICA origins are visualized and normal. The vertebrobasilar junction basilar artery normal. The superior cerebellar arteries are patent bilaterally. Both posterior cerebral arteries originate from basilar tip. The PCA branch vessels are normal bilaterally. No aneurysm is present. Venous sinuses: The dural sinuses are patent. The straight sinus and deep cerebral veins are intact. Cortical veins are within normal limits. No significant vascular malformation is evident. Anatomic variants: None Review of the MIP images confirms the above findings IMPRESSION: 1. No emergent large vessel occlusion. 2. Minimal atherosclerotic changes at the carotid bifurcations and cavernous internal carotid arteries bilaterally without significant stenosis. 3. Mild tortuosity of the cervical internal carotid arteries bilaterally likely reflects chronic hypertension. 4. Mild degenerative changes of the cervical spine. 5.  Aortic Atherosclerosis (ICD10-I70.0). Electronically Signed   By: Marin Roberts M.D.   On: 05/08/2023 18:20   CT Head Wo Contrast Result Date: 05/08/2023 CLINICAL DATA:  Head trauma, minor (Age >= 65y); Neck trauma (Age >= 65y) EXAM: CT HEAD WITHOUT CONTRAST CT CERVICAL SPINE WITHOUT CONTRAST TECHNIQUE: Multidetector CT imaging of the head and cervical spine was performed following the standard protocol without intravenous contrast. Multiplanar CT image reconstructions  of the cervical spine were also generated. RADIATION DOSE REDUCTION: This exam was performed according to the departmental dose-optimization program which includes automated exposure control, adjustment of the mA and/or kV according to patient size and/or use of iterative reconstruction technique. COMPARISON:  None Available. FINDINGS: CT HEAD FINDINGS Brain: No hemorrhage. No hydrocephalus. No extra-axial fluid collection. 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 No mass effect. No mass lesion. Vascular: No hyperdense vessel or unexpected calcification. Skull: Normal. Negative for fracture or focal lesion. Sinuses/Orbits: No middle ear or mastoid effusion. Paranasal sinuses notable for mucosal thickening in the right sphenoid sinus. Orbits are unremarkable. Other: None. CT CERVICAL SPINE FINDINGS Alignment: Normal. Skull base and vertebrae: No acute fracture. No primary bone lesion or focal pathologic process. Soft tissues and spinal canal: No prevertebral fluid or swelling. No visible canal hematoma. Disc levels:  No evidence of high-grade stenosis. Upper chest: Negative. Other: None IMPRESSION: 1. 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 Recommend brain MRI for further evaluation. 2. No CT evidence of intracranial injury 3. No acute fracture or traumatic subluxation of the cervical spine. Electronically Signed   By: Lorenza Cambridge M.D.   On: 05/08/2023 13:11   CT Cervical Spine Wo Contrast Result Date: 05/08/2023 CLINICAL DATA:  Head trauma, minor (Age >= 65y); Neck trauma (Age >= 65y) EXAM: CT HEAD WITHOUT CONTRAST CT CERVICAL SPINE WITHOUT CONTRAST TECHNIQUE: Multidetector CT imaging of the head and cervical spine was performed following the standard protocol without intravenous contrast. Multiplanar CT image reconstructions of the cervical spine were also generated. RADIATION DOSE REDUCTION: This  exam was performed according to the  departmental dose-optimization program which includes automated exposure control, adjustment of the mA and/or kV according to patient size and/or use of iterative reconstruction technique. COMPARISON:  None Available. FINDINGS: CT HEAD FINDINGS Brain: No hemorrhage. No hydrocephalus. No extra-axial fluid collection. 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 No mass effect. No mass lesion. Vascular: No hyperdense vessel or unexpected calcification. Skull: Normal. Negative for fracture or focal lesion. Sinuses/Orbits: No middle ear or mastoid effusion. Paranasal sinuses notable for mucosal thickening in the right sphenoid sinus. Orbits are unremarkable. Other: None. CT CERVICAL SPINE FINDINGS Alignment: Normal. Skull base and vertebrae: No acute fracture. No primary bone lesion or focal pathologic process. Soft tissues and spinal canal: No prevertebral fluid or swelling. No visible canal hematoma. Disc levels:  No evidence of high-grade stenosis. Upper chest: Negative. Other: None IMPRESSION: 1. 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 Recommend brain MRI for further evaluation. 2. No CT evidence of intracranial injury 3. No acute fracture or traumatic subluxation of the cervical spine. Electronically Signed   By: Lorenza Cambridge M.D.   On: 05/08/2023 13:11    Disposition Plan & Communication  Patient status: Inpatient  Admitted From: Home Planned disposition location: Home vs CIR? Anticipated discharge date: 12/30 pending neurology clearance, long term cardiac monitoring placement  Family Communication: call daughter    Author: Leeroy Bock, DO [facility] 05/09/2023, 2:16 PM   Available by Epic secure chat 7AM-7PM. If 7PM-7AM, please contact night-coverage.  [facility] contact information found on [email or web address].

## 2023-05-09 NOTE — TOC CM/SW Note (Signed)
Transition of Care [facility]) - Inpatient Brief Assessment   Patient Details  Name: Christine Reyes MRN: [id number] Date of Birth: [birth year]  [facility] CM/SW Contact:    Rodney Langton, RN Phone Number: 05/09/2023, 1:11 PM   Clinical Narrative:  Brief assessment completed, noted recommendations for inpatient rehab.  TOC will continue to follow for needs.   Transition of Care Asessment: Insurance and Status: Insurance coverage has been reviewed Patient has primary care physician: Yes Home environment has been reviewed: Yes Prior level of function:: Independent Prior/Current Home Services: No current home services Social Drivers of Health Review: SDOH reviewed no interventions necessary Readmission risk has been reviewed: Yes Transition of care needs: transition of care needs identified, TOC will continue to follow

## 2023-05-09 NOTE — Progress Notes (Signed)
NEUROLOGY CONSULT FOLLOW UP NOTE   Date of service: May 09, 2023 Patient Name: Christine Reyes MRN:  [id number] DOB:  09/29/1955  Brief HPI  Christine Reyes is a 67 y.o. female who presented with confusion and difficulty walking and was found to have multifocal posterior circulation infarcts.   Interval Hx/subjective   No significant changes Vitals   Vitals:   05/08/23 2016 05/08/23 2044 05/09/23 0434 05/09/23 0748  BP: (!) 129/53  120/66 139/79  Pulse: 66 68 64 71  Resp:   19 16  Temp: (!) 97.3 F (36.3 C)  98.5 F (36.9 C) 98.6 F (37 C)  TempSrc:    Oral  SpO2:  98% 100% 100%  Weight:      Height:         Body mass index is 39.77 kg/m.  Physical Exam   General: In bed, NAD  Neurologic Examination   MS: Awake, alert, interactive and appropriate CN: She is able to identify finger wiggling in all four visual fields, but she is much more in certain of finger counting on the right side (though she is able to do it) she has hesitancy before finally getting a ride.  Face is symmetric Motor: She has bilateral leg weakness, I suspected it was due to effort yesterday, but appears to be true weakness.  She is able to lift her arms against gravity without drift. Sensory: Intact to light touch  Labs and Diagnostic Imaging   CBC:  Recent Labs  Lab 05/08/23 0926  WBC 6.9  HGB 11.8*  HCT 36.9  MCV 99.5  PLT 402*    Basic Metabolic Panel:  Lab Results  Component Value Date   NA 137 05/09/2023   K 4.8 05/08/2023   CO2 16 (L) 05/08/2023   GLUCOSE 72 05/08/2023   BUN 18 05/08/2023   CREATININE 0.85 05/08/2023   CALCIUM 7.1 (L) 05/08/2023   GFRNONAA >60 05/08/2023   GFRAA >60 11/04/2018   Lipid Panel:  Lab Results  Component Value Date   LDLCALC 89 05/09/2023   HgbA1c:  Lab Results  Component Value Date   HGBA1C 4.3 (L) 05/08/2023   Urine Drug Screen: No results found for: "LABOPIA", "COCAINSCRNUR", "LABBENZ", "AMPHETMU", "THCU", "LABBARB"  Alcohol  Level No results found for: "[facility]" INR  Lab Results  Component Value Date   INR 1.2 01/09/2021   APTT  Lab Results  Component Value Date   APTT 29 01/09/2021    Impression   Christine Reyes is a 67 y.o. female with multifocal posterior circulation infarcts.  The appearance is most consistent with an embolic phenomenon.  She will need workup for such.  There is no clear posterior circulation source on CTA head and neck.  Echo is pending.  With her history of DVT, I think that doing a lower extremity ultrasound would not be unreasonable.  Unless there is a clear reason for anticoagulation, though she was intermittently taking Eliquis prior to this, I would favor dual antiplatelet therapy.  Recommendations  Continue aspirin and Plavix for 3 weeks followed by aspirin monotherapy Atorvastatin 40 mg nightly Follow-up echo If no clear etiology on TTE, I would favor prolonged cardiac monitoring as a next step in evaluation. PT, OT, ST ______________________________________________________________________   Stormy Card, MD [facility] [phone]  If 7pm- 7am, please page neurology on call as listed in AMION.

## 2023-05-10 DIAGNOSIS — I6349 Cerebral infarction due to embolism of other cerebral artery: Secondary | ICD-10-CM | POA: Diagnosis not present

## 2023-05-10 MED ORDER — TRAZODONE HCL 50 MG PO TABS
100.0000 mg | ORAL_TABLET | Freq: Every day | ORAL | Status: DC
  Administered 2023-05-10 – 2023-05-13 (×4): 100 mg via ORAL
  Filled 2023-05-10 (×4): qty 2

## 2023-05-10 MED ORDER — BACLOFEN 10 MG PO TABS
5.0000 mg | ORAL_TABLET | Freq: Three times a day (TID) | ORAL | Status: DC | PRN
  Administered 2023-05-10 – 2023-05-12 (×4): 5 mg via ORAL
  Filled 2023-05-10 (×4): qty 1

## 2023-05-10 NOTE — Progress Notes (Signed)
Physical Therapy Treatment Patient Details Name: Christine Reyes MRN: [id number] DOB: [birth year] Today's Date: 05/10/2023   History of Present Illness Pt is a 67 y.o. female presenting to hospital 05/08/23 with c/o weakness; episode of syncope (hit her head; mild HA since fall).  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  Pt admitted with CVA, hyponatremia, and hypertensive disorder.  PMH includes htn, obesity s/p gastric bypass, R TKA 2019, L knee sx.    PT Comments  Pt resting in bed upon PT arrival; pt agreeable to therapy.  No c/o pain.  Pt reports feeling better today in general but still having visual impairments.  During session pt SBA with bed mobility; min assist with transfers (d/t pt unsteady initially upon standing); and CGA to min assist to ambulate 60 feet with youth sized RW use.  Pt reporting feeling unsteady during ambulation (pt appearing unsteady requiring CGA to min assist for safety/balance); limited distance ambulating d/t c/o mild dizziness/lightheadedness and generalized weakness (symptoms improved once resting in bed).  Will continue to focus on strengthening, balance, and progressive functional mobility during hospitalization.   If plan is discharge home, recommend the following: A little help with walking and/or transfers;A little help with bathing/dressing/bathroom;Assistance with cooking/housework;Assist for transportation;Help with stairs or ramp for entrance   Can travel by private vehicle        Equipment Recommendations  Rolling walker (2 wheels) (youth sized)    Recommendations for Other Services       Precautions / Restrictions Precautions Precautions: Fall Restrictions Weight Bearing Restrictions Per Provider Order: No     Mobility  Bed Mobility Overal bed mobility: Needs Assistance Bed Mobility: Supine to Sit, Sit to Supine     Supine to sit:  Supervision, HOB elevated Sit to supine: Supervision, HOB elevated   General bed mobility comments: increased effort to perform on own    Transfers Overall transfer level: Needs assistance Equipment used: Rolling walker (2 wheels) Transfers: Sit to/from Stand Sit to Stand: Min assist           General transfer comment: x2 trials standing from bed; assist to steady; vc's for UE placement    Ambulation/Gait Ambulation/Gait assistance: Contact guard assist, Min assist Gait Distance (Feet): 60 Feet Assistive device: Rolling walker (2 wheels) (youth sized) Gait Pattern/deviations: Decreased step length - right, Decreased step length - left Gait velocity: decreased     General Gait Details: mild decreased stance time L LE; assist to steady intermittently   Stairs             Wheelchair Mobility     Tilt Bed    Modified Rankin (Stroke Patients Only)       Balance Overall balance assessment: Needs assistance Sitting-balance support: No upper extremity supported, Feet supported Sitting balance-Leahy Scale: Good Sitting balance - Comments: steady reaching within BOS   Standing balance support: Bilateral upper extremity supported, During functional activity, Reliant on assistive device for balance Standing balance-Leahy Scale: Poor Standing balance comment: intermittent assist to steady with ambulation (with walker use)                            Cognition Arousal: Alert Behavior During Therapy: WFL for tasks assessed/performed Overall Cognitive Status: No family/caregiver present to determine baseline cognitive functioning Area of Impairment: Safety/judgement, Following commands  Following Commands: Follows multi-step commands with increased time Safety/Judgement: Decreased awareness of deficits   Problem Solving: Requires verbal cues          Exercises      General Comments  Nursing cleared pt for  participation in physical therapy.  Pt agreeable to PT session.      Pertinent Vitals/Pain Pain Assessment Pain Assessment: No/denies pain Pain Score: 0-No pain Pain Intervention(s): Limited activity within patient's tolerance, Monitored during session Vitals (HR and SpO2 on room air) stable and WNL throughout treatment session.    Home Living                          Prior Function            PT Goals (current goals can now be found in the care plan section) Acute Rehab PT Goals Patient Stated Goal: to improve walking PT Goal Formulation: With patient Time For Goal Achievement: 05/23/23 Potential to Achieve Goals: Good Progress towards PT goals: Progressing toward goals    Frequency    Min 1X/week      PT Plan      Co-evaluation              AM-PAC PT "6 Clicks" Mobility   Outcome Measure  Help needed turning from your back to your side while in a flat bed without using bedrails?: None Help needed moving from lying on your back to sitting on the side of a flat bed without using bedrails?: A Little Help needed moving to and from a bed to a chair (including a wheelchair)?: A Little Help needed standing up from a chair using your arms (e.g., wheelchair or bedside chair)?: A Little Help needed to walk in hospital room?: A Little Help needed climbing 3-5 steps with a railing? : A Lot 6 Click Score: 18    End of Session Equipment Utilized During Treatment: Gait belt Activity Tolerance: Patient tolerated treatment well Patient left: in bed;with call bell/phone within reach;with bed alarm set Nurse Communication: Mobility status;Precautions PT Visit Diagnosis: Unsteadiness on feet (R26.81);Other abnormalities of gait and mobility (R26.89);Muscle weakness (generalized) (M62.81);Hemiplegia and hemiparesis Hemiplegia - Right/Left: Left Hemiplegia - caused by: Cerebral infarction     Time: 1610-9604 PT Time Calculation (min) (ACUTE ONLY): 17  min  Charges:    $Gait Training: 8-22 mins PT General Charges $$ ACUTE PT VISIT: 1 Visit                     Hendricks Limes, PT 05/10/23, 3:04 PM

## 2023-05-10 NOTE — Progress Notes (Signed)
PROGRESS NOTE  Christine Reyes    DOB: August 17, 1955, 67 y.o.  YHC:[id number]    Code Status: Full Code   DOA: 05/08/2023   LOS: 2   Brief hospital course  Christine Reyes is a 67 y.o. 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   Er course: afebrile, blood pressure 130s to 150s over 70s to 110s.  Satting well on room air.  White count 6.9, hemoglobin 11.8, platelets 402, lactate 0.4.  Troponin within normal limits.  Urinalysis stable.  Creatinine 0.85.  Sodium 125.  Found to be orthostatic positive in the ER.  CT head with acute to subacute cortical infarcts in the left occipital lobe. MRI brain: multiple acute infarcts- bilateral occipital lobes, punctate right cerebellum and possible right lateral pons. CTA head and neck- no emergent large vessel occlusion.   LE doppler negative for DVT.  Echo is still pending.  Neurology is consulted.  PT/OT recommending CIR.   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 No DVT in LE doppler. No severe occlusive disease on head/neck CTA. Symptoms have improved and complains of visual field loss, confusion, and headache.  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 Not recommended to be on eliquis any more but she states that she takes it intermittently when she has knee pain. Last time was about 2 weeks ago. - normal DVT ppx at this time  pending further embolic workup.     Hyponatremia- Na 125 on presentation- resolved  Gastroesophageal reflux disease without esophagitis Ppi    Hypertensive disorder- well controlled  Body mass index is 39.77 kg/m.  VTE ppx: enoxaparin (LOVENOX) injection 40 mg Start: 05/08/23 1600  Diet:     Diet   Diet regular Room service appropriate? Yes; Fluid consistency: Thin   Consultants: Neurology   Subjective 05/10/23    Pt reports doing well. Continues to have some visual field deficits. Headache has improved.    Objective   Vitals:   05/09/23 1602 05/09/23 2042 05/10/23 0005 05/10/23 0411  BP: 129/64 136/67 138/73 138/70  Pulse: 75 63 72 63  Resp: 16 19 18 18   Temp: 98.6 F (37 C) 98.2 F (36.8 C) 98.4 F (36.9 C) 97.8 F (36.6 C)  TempSrc: Oral     SpO2: 100% 98% 92% 95%  Weight:      Height:       No intake or output data in the 24 hours ending 05/10/23 0717  Filed Weights   05/08/23 1535  Weight: 83.4 kg    Physical Exam:  General: awake, alert, NAD HEENT: atraumatic, clear conjunctiva, anicteric sclera, MMM, hearing grossly normal Respiratory: normal respiratory effort. Cardiovascular: quick capillary refill, normal S1/S2, RRR, no JVD, murmurs Gastrointestinal: soft, NT, ND Nervous: A&O x3. no gross focal neurologic deficits, normal speech. Acuity decreased and non-specific Extremities: moves all equally, no edema, normal tone Skin: dry, intact, normal temperature, normal color. No rashes, lesions or ulcers on exposed skin Psychiatry: normal mood,  congruent affect  Labs   I have personally reviewed the following labs and imaging studies CBC    Component Value Date/Time   WBC 6.9 05/08/2023 0926   RBC 3.71 (L) 05/08/2023 0926   HGB 11.8 (L) 05/08/2023 0926   HCT 36.9 05/08/2023 0926   PLT 402 (H) 05/08/2023 0926   MCV 99.5 05/08/2023 0926   MCH 31.8 05/08/2023 0926   MCHC 32.0 05/08/2023 0926   RDW 13.3 05/08/2023 0926   LYMPHSABS 2.5 02/04/2021  1332   MONOABS 0.7 02/04/2021 1332   EOSABS 0.1 02/04/2021 1332   BASOSABS 0.0 02/04/2021 1332      Latest Ref Rng & Units 05/09/2023    2:54 AM 05/08/2023   10:51 PM 05/08/2023    5:30 PM  BMP  Sodium 135 - 145 mmol/L 137  138  142    142     US Venous Img Lower Bilateral (DVT) Result Date: 05/09/2023 CLINICAL DATA:  Lower extremity pain. History of prior left posterior tibial vein DVT in 2022. EXAM: BILATERAL LOWER EXTREMITY VENOUS DOPPLER ULTRASOUND TECHNIQUE: Gray-scale sonography with graded compression, as well as color Doppler and duplex ultrasound were performed to evaluate the lower extremity deep venous systems from the level of the common femoral vein and including the common femoral, femoral, profunda femoral, popliteal and calf veins including the posterior tibial, peroneal and gastrocnemius veins when visible. The superficial great saphenous vein was also interrogated. Spectral Doppler was utilized to evaluate flow at rest and with distal augmentation maneuvers in the common femoral, femoral and popliteal veins. COMPARISON:  Report from a prior lower extremity venous duplex study at [facility] dated 12/02/2020 FINDINGS: RIGHT LOWER EXTREMITY Common Femoral Vein: No evidence of thrombus. Normal compressibility, respiratory phasicity and response to augmentation. Saphenofemoral Junction: No evidence of thrombus. Normal compressibility and flow on color Doppler imaging. Profunda Femoral Vein: No evidence of thrombus. Normal compressibility and flow on color Doppler imaging. Femoral Vein: No evidence of thrombus. Normal compressibility, respiratory phasicity and response to augmentation. Popliteal Vein: No evidence of thrombus. Normal compressibility, respiratory phasicity and response to augmentation. Calf Veins: No evidence of thrombus. Normal compressibility and flow on color Doppler imaging. Superficial Great Saphenous Vein: No evidence of thrombus. Normal compressibility. Venous Reflux:   None. Other Findings: No evidence of superficial thrombophlebitis or abnormal fluid collection. LEFT LOWER EXTREMITY Common Femoral Vein: No evidence of thrombus. Normal compressibility, respiratory phasicity and response to augmentation. Saphenofemoral Junction: No evidence of thrombus. Normal compressibility and flow on color Doppler imaging. Profunda Femoral Vein: No evidence of thrombus. Normal compressibility and flow on color Doppler imaging. Femoral Vein: No evidence of thrombus. Normal compressibility, respiratory phasicity and response to augmentation. Popliteal Vein: No evidence of thrombus. Normal compressibility, respiratory phasicity and response to augmentation. Calf Veins: No evidence of thrombus. No evidence of further left posterior tibial vein thrombus or visible chronic thrombus. Normal compressibility and flow on color Doppler imaging. Superficial Great Saphenous Vein: No evidence of thrombus. Normal compressibility. Venous Reflux:  None. Other Findings: No evidence of superficial thrombophlebitis or abnormal fluid collection. IMPRESSION: No evidence of deep venous thrombosis in either lower extremity. Resolution of left posterior tibial vein DVT. Electronically Signed   By: Irish Lack M.D.   On: 05/09/2023 13:00   MR BRAIN WO CONTRAST Result Date: 05/08/2023 CLINICAL DATA:  Stroke suspected, weakness that started last night around 11 p.m. EXAM: MRI HEAD WITHOUT CONTRAST TECHNIQUE: Multiplanar, multiecho pulse sequences of the brain and surrounding structures were obtained  without intravenous contrast. COMPARISON:  No prior MRI available, correlation is made with 05/08/2023 CT head FINDINGS: Brain: Restricted diffusion with ADC correlate in the left occipital lobe, both cortex and subcortical white matter (series 9, images 18-27), which correlates with the hypodensity seen on the same-day CT. Additional smaller area of restricted diffusion with ADC correlate in the right occipital  cortex (series 9, images 25-29). The bilateral occipital infarcts are associated with foci of hemosiderin deposition (series 13, image 32 on the right and 25 on the left), which may represent thromboemboli. These areas are associated with increased T2 hyperintense signal. Additional punctate foci of restricted diffusion in the right cerebellum (series 9, images 17-19 and 21). Possible restricted diffusion with ADC correlate in the right lateral pons (series 9, image 19). No acute hemorrhage, mass, mass effect, or midline shift. No hydrocephalus or extra-axial collection. Pituitary and craniocervical junction within normal limits. Remote infarcts in the left cerebellum. Cerebral volume is normal for age. Vascular: Normal arterial flow voids. Skull and upper cervical spine: Normal marrow signal. Sinuses/Orbits: Clear paranasal sinuses. No acute finding in the orbits. Other: The mastoid air cells are well aerated. IMPRESSION: 1. Acute infarcts in the bilateral occipital lobes, left greater than right, which correlate with the hypodensity seen on the same-day CT. The bilateral occipital infarcts are associated with foci of hemosiderin deposition, which may represent thromboemboli. 2. Additional punctate acute infarcts in the right cerebellum and possible acute infarct in the right lateral pons. These results will be called to the ordering clinician or representative by the Radiologist Assistant, and communication documented in the PACS or [employer]. Electronically Signed   By: Wiliam Ke M.D.   On: 05/08/2023 21:57   CT ANGIO HEAD NECK W WO CM Result Date: 05/08/2023 CLINICAL DATA:  Weakness beginning last night.  Syncopal episode. EXAM: CT ANGIOGRAPHY HEAD AND NECK WITH AND WITHOUT CONTRAST TECHNIQUE: Multidetector CT imaging of the head and neck was performed using the standard protocol during bolus administration of intravenous contrast. Multiplanar CT image reconstructions and MIPs were obtained to  evaluate the vascular anatomy. Carotid stenosis measurements (when applicable) are obtained utilizing NASCET criteria, using the distal internal carotid diameter as the denominator. RADIATION DOSE REDUCTION: This exam was performed according to the departmental dose-optimization program which includes automated exposure control, adjustment of the mA and/or kV according to patient size and/or use of iterative reconstruction technique. CONTRAST:  75mL OMNIPAQUE IOHEXOL 350 MG/ML SOLN COMPARISON:  CT head without contrast 05/08/2023 and 01/09/2021. FINDINGS: CTA NECK FINDINGS Aortic arch: Atherosclerotic calcifications are present in the aortic arch. No aneurysm or stenosis is present. Great vessel origins are within normal limits. Right carotid system: The right common carotid artery is within normal limits. The bifurcation is unremarkable. Mild tortuosity of the cervical right ICA is present without significant stenosis. Left carotid system: The left common carotid artery is within normal limits. Minimal calcification is present at the bifurcation. Mild tortuosity of the cervical left ICA is present without significant stenosis. Vertebral arteries: The left vertebral artery is slightly dominant. Both vertebral arteries originate from the subclavian arteries without significant stenosis. No significant stenosis is present in either vertebral artery in the neck. Skeleton: Mild degenerative changes are present cervical spine. Vertebral body heights and alignment are normal. No focal osseous lesions are present. Other neck: Soft tissues the neck are otherwise unremarkable. Salivary glands are within normal limits. Thyroid is normal. No significant adenopathy is present. No focal mucosal or submucosal lesions are present. Upper  chest: The lung apices are clear. The thoracic inlet is within normal limits. Review of the MIP images confirms the above findings CTA HEAD FINDINGS Anterior circulation: Atherosclerotic  calcifications are present within the cavernous internal carotid arteries bilaterally without focal stenosis through the ICA termini. The A1 and M1 segments are normal. The anterior communicating artery is patent. No definite anterior communicating artery is present. MCA bifurcations are within normal limits bilaterally. The ACA and MCA branch vessels are normal. No aneurysm is present. Posterior circulation: The PICA origins are visualized and normal. The vertebrobasilar junction basilar artery normal. The superior cerebellar arteries are patent bilaterally. Both posterior cerebral arteries originate from basilar tip. The PCA branch vessels are normal bilaterally. No aneurysm is present. Venous sinuses: The dural sinuses are patent. The straight sinus and deep cerebral veins are intact. Cortical veins are within normal limits. No significant vascular malformation is evident. Anatomic variants: None Review of the MIP images confirms the above findings IMPRESSION: 1. No emergent large vessel occlusion. 2. Minimal atherosclerotic changes at the carotid bifurcations and cavernous internal carotid arteries bilaterally without significant stenosis. 3. Mild tortuosity of the cervical internal carotid arteries bilaterally likely reflects chronic hypertension. 4. Mild degenerative changes of the cervical spine. 5.  Aortic Atherosclerosis (ICD10-I70.0). Electronically Signed   By: Marin Roberts M.D.   On: 05/08/2023 18:20   CT Head Wo Contrast Result Date: 05/08/2023 CLINICAL DATA:  Head trauma, minor (Age >= 65y); Neck trauma (Age >= 65y) EXAM: CT HEAD WITHOUT CONTRAST CT CERVICAL SPINE WITHOUT CONTRAST TECHNIQUE: Multidetector CT imaging of the head and cervical spine was performed following the standard protocol without intravenous contrast. Multiplanar CT image reconstructions of the cervical spine were also generated. RADIATION DOSE REDUCTION: This exam was performed according to the departmental  dose-optimization program which includes automated exposure control, adjustment of the mA and/or kV according to patient size and/or use of iterative reconstruction technique. COMPARISON:  None Available. FINDINGS: CT HEAD FINDINGS Brain: No hemorrhage. No hydrocephalus. No extra-axial fluid collection. 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 No mass effect. No mass lesion. Vascular: No hyperdense vessel or unexpected calcification. Skull: Normal. Negative for fracture or focal lesion. Sinuses/Orbits: No middle ear or mastoid effusion. Paranasal sinuses notable for mucosal thickening in the right sphenoid sinus. Orbits are unremarkable. Other: None. CT CERVICAL SPINE FINDINGS Alignment: Normal. Skull base and vertebrae: No acute fracture. No primary bone lesion or focal pathologic process. Soft tissues and spinal canal: No prevertebral fluid or swelling. No visible canal hematoma. Disc levels:  No evidence of high-grade stenosis. Upper chest: Negative. Other: None IMPRESSION: 1. 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 Recommend brain MRI for further evaluation. 2. No CT evidence of intracranial injury 3. No acute fracture or traumatic subluxation of the cervical spine. Electronically Signed   By: Lorenza Cambridge M.D.   On: 05/08/2023 13:11   CT Cervical Spine Wo Contrast Result Date: 05/08/2023 CLINICAL DATA:  Head trauma, minor (Age >= 65y); Neck trauma (Age >= 65y) EXAM: CT HEAD WITHOUT CONTRAST CT CERVICAL SPINE WITHOUT CONTRAST TECHNIQUE: Multidetector CT imaging of the head and cervical spine was performed following the standard protocol without intravenous contrast. Multiplanar CT image reconstructions of the cervical spine were also generated. RADIATION DOSE REDUCTION: This exam was performed according to the departmental dose-optimization program which includes automated exposure control, adjustment of the mA and/or kV according to patient  size and/or use of iterative reconstruction  technique. COMPARISON:  None Available. FINDINGS: CT HEAD FINDINGS Brain: No hemorrhage. No hydrocephalus. No extra-axial fluid collection. 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 No mass effect. No mass lesion. Vascular: No hyperdense vessel or unexpected calcification. Skull: Normal. Negative for fracture or focal lesion. Sinuses/Orbits: No middle ear or mastoid effusion. Paranasal sinuses notable for mucosal thickening in the right sphenoid sinus. Orbits are unremarkable. Other: None. CT CERVICAL SPINE FINDINGS Alignment: Normal. Skull base and vertebrae: No acute fracture. No primary bone lesion or focal pathologic process. Soft tissues and spinal canal: No prevertebral fluid or swelling. No visible canal hematoma. Disc levels:  No evidence of high-grade stenosis. Upper chest: Negative. Other: None IMPRESSION: 1. 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 Recommend brain MRI for further evaluation. 2. No CT evidence of intracranial injury 3. No acute fracture or traumatic subluxation of the cervical spine. Electronically Signed   By: Lorenza Cambridge M.D.   On: 05/08/2023 13:11    Disposition Plan & Communication  Patient status: Inpatient  Admitted From: Home Planned disposition location: Home vs CIR? Anticipated discharge date: 12/30 pending neurology clearance, long term cardiac monitoring placement, CIR admission  Family Communication: daughter on phone   Author: Leeroy Bock, DO [facility] 05/10/2023, 7:17 AM   Available by Epic secure chat 7AM-7PM. If 7PM-7AM, please contact night-coverage.  [facility] contact information found on [email or web address].

## 2023-05-10 NOTE — PMR Pre-admission (Signed)
 PMR Admission Coordinator Pre-Admission Assessment  Patient: Christine Reyes is an 67 y.o., female MRN: [id number] DOB: October 30, 1955 Height: 4' 9 (144.8 cm) Weight: 83.4 kg  Insurance Information HMO: yes    PPO:      PCP:      IPA:      80/20:      OTHER:  PRIMARY: UHC Medicare Dual Complete      Policy#: [id number]      Subscriber: patient CM Name: Eleanor      Phone#: [phone] option #3     Fax#: [phone] Pre-Cert#: [id number]   approved 1/2 until 05/20/23  Employer:  Benefits:  Phone #: [email or web address]     Name:  Eff. Date: 05/13/23    Deduct: $240 ($0 met)      Out of Pocket Max: $9350 ($0 met)      Life Max: NA CIR: $1,565 co-pay/admission      SNF: 20 full days Outpatient: 80% coverage     Co-Pay: 20% co-insurance Home Health: 100% coverage      Co-Pay:  DME: 80% coverage     Co-Pay: 20% co-insurance Providers: in-network  SECONDARY: Medicaid of [place]      Policy#: [id number]     Phone#: [phone]  Financial Counselor:       Phone#:   The "Data Collection Information Summary" for patients in Inpatient Rehabilitation Facilities with attached "Privacy Act Statement-Health Care Records" was provided and verbally reviewed with: Patient and Family  Emergency Contact Information Contact Information     Name Relation Home Work Mobile   [place] Daughter   [phone]      Other Contacts     Name Relation Home Work Mobile   [place] Granddaughter [phone]        Current Medical History  Patient Admitting Diagnosis: CVA  History of Present Illness:  67 year old right-handed female with history significant for anxiety, hypertension, iron  deficiency anemia, obesity status post gastric bypass with BMI 39.77, quit smoking 7 years ago, right TKA 2019 complicated by DVT.  Presented to [facility] 05/08/2023 after reported fall with altered mental status/headache, generalized weakness as well as blurred vision.  Cranial CT scan showed acute to subacute cortical  infarct in the left occipital lobe.  CT cervical spine negative.  CTA of head and neck with no emergent large vessel occlusion.  MRI showed acute infarct in the bilateral occipital lobes, left greater than right.  The bilateral occipital infarcts with foci of hemosiderin deposition possibly representing thromboemboli.  Additional punctate acute infarct in the right cerebellum and possible acute infarct in the right lateral pons.  Patient did not receive tPA.  Bilateral lower extremity ultrasound showed no evidence of DVT in either lower extremity and resolution of left posterior tibial vein DVT.  Admission chemistries unremarkable except sodium of 125, CO2 of 16, osmolality 273, urine osmolality 686, free T4 0.56, TSH 4.077.  Echocardiogram with ejection fraction of 55 to 60% no wall motion abnormalities grade 1 diastolic dysfunction.  Neurology follow-up placed on aspirin  81 mg daily and Plavix  75 mg daily for CVA prophylaxis x 3 weeks then aspirin  alone.  Lovenox  added for DVT prophylaxis.  Hyponatremia resolved after initial fluid restriction latest sodium of 141.  Tolerating a regular consistency diet.   Complete NIHSS TOTAL: 14  Patient's medical record from [facility] has been reviewed by the rehabilitation admission coordinator and physician.  Past Medical History  Past Medical History:  Diagnosis Date   Anxiety    Arthritis  Headache    Hypertension    Iron  deficiency anemia 06/17/2018   Obesity    Sleep apnea    Has the patient had major surgery during 100 days prior to admission? No  Family History   family history includes Arthritis in her mother; Bipolar disorder in her daughter; Cancer in her father; Hypertension in her mother.  Current Medications  Current Facility-Administered Medications:    acetaminophen  (TYLENOL ) tablet 650 mg, 650 mg, Oral, TID, 650 mg at 05/13/23 2119 **FOLLOWED BY** acetaminophen  (TYLENOL ) tablet 650 mg, 650 mg, Oral, Q6H PRN,  Von Bellis, MD   alum & mag hydroxide-simeth (MAALOX/MYLANTA) 200-200-20 MG/5ML suspension 30 mL, 30 mL, Oral, Q4H PRN, Von Bellis, MD   aspirin  EC tablet 81 mg, 81 mg, Oral, Daily, Eldonna Elspeth PARAS, MD, 81 mg at 05/13/23 9068   atorvastatin  (LIPITOR) tablet 40 mg, 40 mg, Oral, Daily, Michaela Aisha SQUIBB, MD, 40 mg at 05/13/23 9075   baclofen  (LIORESAL ) tablet 5 mg, 5 mg, Oral, TID PRN, Lenon Marien CROME, MD, 5 mg at 05/12/23 1317   [COMPLETED] clopidogrel  (PLAVIX ) tablet 300 mg, 300 mg, Oral, Once, 300 mg at 05/08/23 2233 **AND** clopidogrel  (PLAVIX ) tablet 75 mg, 75 mg, Oral, Daily, Michaela Aisha SQUIBB, MD, 75 mg at 05/13/23 9075   [EXPIRED] diclofenac  Sodium (VOLTAREN ) 1 % topical gel 4 g, 4 g, Topical, TID, 4 g at 05/12/23 1534 **FOLLOWED BY** diclofenac  Sodium (VOLTAREN ) 1 % topical gel 4 g, 4 g, Topical, TID PRN, Von Bellis, MD   enoxaparin  (LOVENOX ) injection 40 mg, 40 mg, Subcutaneous, Q24H, Eldonna Elspeth PARAS, MD, 40 mg at 05/13/23 1612   folic acid  (FOLVITE ) tablet 1 mg, 1 mg, Oral, Daily, Von Bellis, MD, 1 mg at 05/13/23 9075   gabapentin  (NEURONTIN ) capsule 300 mg, 300 mg, Oral, BID, Von Bellis, MD, 300 mg at 05/13/23 2120   labetalol  (NORMODYNE ) injection 10 mg, 10 mg, Intravenous, Q2H PRN, Eldonna Elspeth PARAS, MD   oxyCODONE  (Oxy IR/ROXICODONE ) immediate release tablet 5 mg, 5 mg, Oral, Q6H PRN, Von Bellis, MD, 5 mg at 05/14/23 9370   pantoprazole  (PROTONIX ) EC tablet 40 mg, 40 mg, Oral, Daily, Eldonna Elspeth PARAS, MD, 40 mg at 05/13/23 9075   traZODone  (DESYREL ) tablet 100 mg, 100 mg, Oral, QHS, Lenon Marien CROME, MD, 100 mg at 05/13/23 2119  Patients Current Diet:  Diet Order             Diet regular Room service appropriate? Yes; Fluid consistency: Thin  Diet effective now                  Precautions / Restrictions Precautions Precautions: Fall Restrictions Weight Bearing Restrictions Per Provider Order: No   Has the patient had 2 or more falls  or a fall with injury in the past year? No  Prior Activity Level Community (5-7x/wk): drives, gets out of house daily  Prior Functional Level Self Care: Did the patient need help bathing, dressing, using the toilet or eating? Independent  Indoor Mobility: Did the patient need assistance with walking from room to room (with or without device)? Independent  Stairs: Did the patient need assistance with internal or external stairs (with or without device)? Independent  Functional Cognition: Did the patient need help planning regular tasks such as shopping or remembering to take medications? Independent  Patient Information Are you of Hispanic, Latino/a,or Spanish origin?: A. No, not of Hispanic, Latino/a, or Spanish origin What is your race?: B. Black or African American Do you need or  want an interpreter to communicate with a doctor or health care staff?: 0. No  Patient's Response To:  Health Literacy and Transportation Is the patient able to respond to health literacy and transportation needs?: Yes Health Literacy - How often do you need to have someone help you when you read instructions, pamphlets, or other written material from your doctor or pharmacy?: Never In the past 12 months, has lack of transportation kept you from medical appointments or from getting medications?: No In the past 12 months, has lack of transportation kept you from meetings, work, or from getting things needed for daily living?: No  Home Assistive Devices / Equipment Home Equipment: Grab bars - toilet  Prior Device Use: Indicate devices/aids used by the patient prior to current illness, exacerbation or injury? None of the above  Current Functional Level Cognition  Arousal/Alertness: Awake/alert Overall Cognitive Status: No family/caregiver present to determine baseline cognitive functioning Orientation Level: Oriented X4 Following Commands: Follows multi-step commands with increased time Safety/Judgement:  Decreased awareness of deficits Memory: Impaired Memory Impairment: Retrieval deficit Awareness: Appears intact Problem Solving: Appears intact Executive Function: Organizing, Reasoning Reasoning: Appears intact Organizing: Impaired Organizing Impairment: Functional complex, Verbal complex Safety/Judgment: Appears intact    Extremity Assessment (includes Sensation/Coordination)  Upper Extremity Assessment: Left hand dominant, LUE deficits/detail, Generalized weakness LUE Deficits / Details: 3/4 full ROM remains in shoulders likely d/t pain, continued weakness throughout LUE Coordination: decreased fine motor, decreased gross motor  Lower Extremity Assessment: Defer to PT evaluation LLE Deficits / Details: hip flexion 3-/5; knee flexion 3+/5; knee extension 3+/5; DF 3/5    ADLs  Overall ADL's : Needs assistance/impaired Eating/Feeding: Supervision/ safety, Sitting Grooming: Wash/dry face, Sitting, Set up, Wash/dry hands, Standing, Contact guard assist Upper Body Bathing: Supervision/ safety, Sitting Lower Body Bathing: Sit to/from stand, Minimal assistance Lower Body Bathing Details (indicate cue type and reason): Min A to bathe bil feet d/t pt unable to reach Lower Body Dressing: Maximal assistance, Sitting/lateral leans Lower Body Dressing Details (indicate cue type and reason): to doff/don socks seated in recliner Toilet Transfer: Regular Toilet, Contact guard assist, Grab bars Toilet Transfer Details (indicate cue type and reason): CGA for toilet transfer using grab bar and RW Toileting- Clothing Manipulation and Hygiene: Contact guard assist, Sitting/lateral lean Toileting - Clothing Manipulation Details (indicate cue type and reason): CGA for hygiene on toilet Functional mobility during ADLs:  (approx 6' in room with MIN-MOD A with HHA, pt reports feeling dizzy therefore returned to seated on edge of bed)    Mobility  Overal bed mobility: Needs Assistance Bed Mobility: Sit to  Supine Supine to sit: Supervision, Used rails, HOB elevated Sit to supine: Modified independent (Device/Increase time), HOB elevated General bed mobility comments: increased time and effort to reach EOB today-possibly due to pain    Transfers  Overall transfer level: Needs assistance Equipment used: Rolling walker (2 wheels) Transfers: Sit to/from Stand Sit to Stand: Min assist Bed to/from chair/wheelchair/BSC transfer type:: Step pivot Step pivot transfers: Min assist General transfer comment: assist to steady standing with RW use    Ambulation / Gait / Stairs / Wheelchair Mobility  Ambulation/Gait Ambulation/Gait assistance: Contact guard assist, Min assist Gait Distance (Feet): 100 Feet Assistive device: Rolling walker (2 wheels) (youth sized) Gait Pattern/deviations: Decreased step length - right, Decreased step length - left General Gait Details: mild decreased stance time L LE; assist to steady intermittently with fatigue; R hip drop during R LE advancement; 2 mild loss of balance requiring min  assist to steady; more purposeful/effortful steps noted Gait velocity: decreased/slower gait speed today    Posture / Balance Dynamic Sitting Balance Sitting balance - Comments: steady sitting reaching within BOS Balance Overall balance assessment: Needs assistance Sitting-balance support: No upper extremity supported, Feet supported Sitting balance-Leahy Scale: Good Sitting balance - Comments: steady sitting reaching within BOS Standing balance support: Bilateral upper extremity supported, During functional activity, Reliant on assistive device for balance Standing balance-Leahy Scale: Poor Standing balance comment: CGA to min assist for ambulation with RW use; min assist static standing no UE support    Special needs/care consideration Zio patch to be ordered to apply at discharge   Previous Home Environment  Living Arrangements: Parent  Lives With: Other (Comment) (mother. Mother  will be going to live with sister soon) Available Help at Discharge: Family, Available PRN/intermittently Type of Home: House Home Layout: One level Home Access: Ramped entrance Entrance Stairs-Rails: Can reach both Entrance Stairs-Number of Steps: 3 Bathroom Shower/Tub: Engineer[employer]: Handicapped height Bathroom Accessibility: Yes How Accessible: Accessible via walker Home Care Services: No  Discharge Living Setting Plans for Discharge Living Setting: Patient's home Type of Home at Discharge: House Discharge Home Layout: One level Discharge Home Access: Ramped entrance Discharge Bathroom Shower/Tub: Tub/shower unit Discharge Bathroom Toilet: Handicapped height Discharge Bathroom Accessibility: Yes How Accessible: Accessible via walker Does the patient have any problems obtaining your medications?: No  Social/Family/Support Systems Anticipated Caregiver: [name] Daring, daughter and pt's granddaughter Anticipated Caregiver's Contact Information: [phone] Caregiver Availability: Intermittent Discharge Plan Discussed with Primary Caregiver: Yes Is Caregiver In Agreement with Plan?: Yes Does Caregiver/Family have Issues with Lodging/Transportation while Pt is in Rehab?: No  Goals Patient/Family Goal for Rehab: supervision PT, supervision to min OT, supervision SLP Expected length of stay: ELOS 10 to 14 days Pt/Family Agrees to Admission and willing to participate: Yes Program Orientation Provided & Reviewed with Pt/Caregiver Including Roles  & Responsibilities: Yes  Decrease burden of Care through IP rehab admission: NA  Possible need for SNF placement upon discharge: Not anticipated  Patient Condition: I have reviewed medical records from [facility], spoken with CM, and patient and daughter. I discussed via phone for inpatient rehabilitation assessment.  Patient will benefit from ongoing PT, OT, and SLP, can actively participate in 3  hours of therapy a day 5 days of the week, and can make measurable gains during the admission.  Patient will also benefit from the coordinated team approach during an Inpatient Acute Rehabilitation admission.  The patient will receive intensive therapy as well as Rehabilitation physician, nursing, social worker, and care management interventions.  Due to safety, disease management, medication administration, pain management, and patient education the patient requires 24 hour a day rehabilitation nursing.  The patient is currently min assist with mobility and basic ADLs.  Discharge setting and therapy post discharge at home with home health is anticipated.  Patient has agreed to participate in the Acute Inpatient Rehabilitation Program and will admit today.  Preadmission Screen Completed By:  Tinnie SHAUNNA Yvone Delayne, 05/14/2023 10:18 AM ______________________________________________________________________   Discussed status with Dr. Emeline on 05/14/23  at 10:18 AM  and received approval for admission today.  Admission Coordinator:  Tinnie SHAUNNA Yvone Delayne, CCC-SLP, time 10:18 AM Pattricia 05/14/23    Assessment/Plan: Diagnosis: L occipital CVA  Does the need for close, 24 hr/day Medical supervision in concert with the patient's rehab needs make it unreasonable for this patient to be served in a less intensive setting?  Yes Co-Morbidities requiring supervision/potential complications: Hyponatremia, GERD, hypotension, spasticity, poor pain control Due to safety, disease management, medication administration, pain management, and patient education, does the patient require 24 hr/day rehab nursing? Yes Does the patient require coordinated care of a physician, rehab nurse, PT, OT, and SLP to address physical and functional deficits in the context of the above medical diagnosis(es)? Yes Addressing deficits in the following areas: balance, endurance, locomotion, strength, transferring, bathing, dressing, feeding,  grooming, toileting, cognition, speech, language, and psychosocial support Can the patient actively participate in an intensive therapy program of at least 3 hrs of therapy 5 days a week? Yes The potential for patient to make measurable gains while on inpatient rehab is good Anticipated functional outcomes upon discharge from inpatient rehab: supervision PT, supervision OT, supervision SLP Estimated rehab length of stay to reach the above functional goals is: 10-14 days Anticipated discharge destination: Home 10. Overall Rehab/Functional Prognosis: good   MD Signature:  Joesph JAYSON Likes, DO 05/14/2023

## 2023-05-10 NOTE — Progress Notes (Signed)
Inpatient Rehab Admissions:  Inpatient Rehab Consult received.  I talked to pt on the telephone for rehabilitation assessment and to discuss goals and expectations of an inpatient rehab admission.  Discussed average length of stay, insurance authorization requirement, and discharge home after completion of CIR. Pt acknowledged understanding and would like to pursue CIR. Pt gave permission to contact daughter Jayme Cloud. Spoke with [place] on the telephone. She also acknowledged understanding of CIR goals and expectations. She is supportive of pt pursuing CIR. She confirmed that she and pt's granddaughter will be able to provide support for pt after discharge. Will continue to follow.  Signed: Wolfgang Phoenix, MS, CCC-SLP Admissions Coordinator [phone]

## 2023-05-11 ENCOUNTER — Inpatient Hospital Stay
Admit: 2023-05-11 | Discharge: 2023-05-11 | Disposition: A | Discharge status: Home / Self Care | Payer: 59 | Attending: Family Medicine | Admitting: Family Medicine

## 2023-05-11 DIAGNOSIS — I639 Cerebral infarction, unspecified: Secondary | ICD-10-CM | POA: Diagnosis not present

## 2023-05-11 LAB — ECHOCARDIOGRAM COMPLETE
AR max vel: 2.06 cm2
AV Area VTI: 2.06 cm2
AV Area mean vel: 1.87 cm2
AV Mean grad: 6 mm[Hg]
AV Peak grad: 13 mm[Hg]
Ao pk vel: 1.8 m/s
Area-P 1/2: 2.73 cm2
Calc EF: 56.7 %
Height: 57 in
MV VTI: 2.46 cm2
S' Lateral: 2.7 cm
Single Plane A2C EF: 51.2 %
Single Plane A4C EF: 60.8 %
Weight: 2940.8 [oz_av]

## 2023-05-11 LAB — CBC
HCT: 32.9 % — ABNORMAL LOW (ref 36.0–46.0)
Hemoglobin: 11 g/dL — ABNORMAL LOW (ref 12.0–15.0)
MCH: 32.3 pg (ref 26.0–34.0)
MCHC: 33.4 g/dL (ref 30.0–36.0)
MCV: 96.5 fL (ref 80.0–100.0)
Platelets: 319 10*3/uL (ref 150–400)
RBC: 3.41 MIL/uL — ABNORMAL LOW (ref 3.87–5.11)
RDW: 13 % (ref 11.5–15.5)
WBC: 5.3 10*3/uL (ref 4.0–10.5)
nRBC: 0 % (ref 0.0–0.2)

## 2023-05-11 LAB — BASIC METABOLIC PANEL
Anion gap: 9 (ref 5–15)
BUN: 13 mg/dL (ref 8–23)
CO2: 21 mmol/L — ABNORMAL LOW (ref 22–32)
Calcium: 8.3 mg/dL — ABNORMAL LOW (ref 8.9–10.3)
Chloride: 111 mmol/L (ref 98–111)
Creatinine, Ser: 0.83 mg/dL (ref 0.44–1.00)
GFR, Estimated: >60 mL/min (ref >60)
Glucose, Bld: 82 mg/dL (ref 70–99)
Potassium: 4.3 mmol/L (ref 3.5–5.1)
Sodium: 141 mmol/L (ref 135–145)

## 2023-05-11 LAB — IRON AND TIBC
Iron: 82 ug/dL (ref 28–170)
Saturation Ratios: 24 % (ref 10.4–31.8)
TIBC: 336 ug/dL (ref 250–450)
UIBC: 254 ug/dL

## 2023-05-11 LAB — FOLATE: Folate: 6.4 ng/mL (ref >5.9)

## 2023-05-11 LAB — VITAMIN D 25 HYDROXY (VIT D DEFICIENCY, FRACTURES): Vit D, 25-Hydroxy: 34.31 ng/mL (ref 30–100)

## 2023-05-11 LAB — PHOSPHORUS: Phosphorus: 3.4 mg/dL (ref 2.5–4.6)

## 2023-05-11 LAB — VITAMIN B12: Vitamin B-12: 590 pg/mL (ref 180–914)

## 2023-05-11 LAB — TSH: TSH: 4.077 u[IU]/mL (ref 0.350–4.500)

## 2023-05-11 LAB — GLUCOSE, CAPILLARY: Glucose-Capillary: 79 mg/dL (ref 70–99)

## 2023-05-11 LAB — MAGNESIUM: Magnesium: 1.8 mg/dL (ref 1.7–2.4)

## 2023-05-11 LAB — T4, FREE: Free T4: 0.56 ng/dL — ABNORMAL LOW (ref 0.61–1.12)

## 2023-05-11 MED ORDER — FOLIC ACID 1 MG PO TABS
1.0000 mg | ORAL_TABLET | Freq: Every day | ORAL | Status: DC
  Administered 2023-05-11 – 2023-05-14 (×4): 1 mg via ORAL
  Filled 2023-05-11 (×4): qty 1

## 2023-05-11 MED ORDER — OXYCODONE HCL 5 MG PO TABS
5.0000 mg | ORAL_TABLET | Freq: Four times a day (QID) | ORAL | Status: DC | PRN
  Administered 2023-05-11 – 2023-05-14 (×6): 5 mg via ORAL
  Filled 2023-05-11 (×6): qty 1

## 2023-05-11 MED ORDER — GABAPENTIN 300 MG PO CAPS
300.0000 mg | ORAL_CAPSULE | Freq: Two times a day (BID) | ORAL | Status: DC
  Administered 2023-05-11 – 2023-05-14 (×7): 300 mg via ORAL
  Filled 2023-05-11 (×7): qty 1

## 2023-05-11 NOTE — Progress Notes (Addendum)
PROGRESS NOTE  Christine Reyes    DOB: [birth year], 67 y.o.  OZH:[id number]    Code Status: Full Code   DOA: 05/08/2023   LOS: 3   Brief hospital course  Christine Reyes is a 67 y.o. 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   Er course: afebrile, blood pressure 130s to 150s over 70s to 110s.  Satting well on room air.  White count 6.9, hemoglobin 11.8, platelets 402, lactate 0.4.  Troponin within normal limits.  Urinalysis stable.  Creatinine 0.85.  Sodium 125.  Found to be orthostatic positive in the ER.  CT head with acute to subacute cortical infarcts in the left occipital lobe. MRI brain: multiple acute infarcts- bilateral occipital lobes, punctate right cerebellum and possible right lateral pons. CTA head and neck- no emergent large vessel occlusion.   LE doppler negative for DVT.  Echo is still pending.  Neurology is consulted.  PT/OT recommending CIR.   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 No DVT in LE doppler. No severe occlusive disease on head/neck CTA. Symptoms have improved and complains of visual field loss, confusion, and headache.  - continuous telemetry, long-term placement per cards prior to dc - echo pending Neurology consulted  Follow up recommendations - aspirin, plavix for 3 weeks, followed by aspirin only - high dose statin  H/o DVT following R knee arthroplasty in remote history. Not recommended to be on eliquis any more but she states that she takes it intermittently when she has knee pain. Last time was about 2 weeks  ago. - normal DVT ppx at this time pending further embolic workup.     Hyponatremia- Na 125 on presentation- resolved  Gastroesophageal reflux disease without esophagitis Ppi    Hypertensive disorder- well controlled  Folic acid level 6.4, at lower end, started folic acid 1 mg p.o. daily.  Follow-up with PCP to repeat folic acid level after 3 to 6 months.  Abnormal thyroid function test Patient has been previously tested for hypo versus hypothyroid TSH 4.07 wnl but at higher end, free T4 level 0.56 low Recommended to repeat thyroid profile in 4 to 6 weeks and follow-up with endocrine as an outpatient for further management.   Body mass index is 39.77 kg/m.  VTE ppx: enoxaparin (LOVENOX) injection 40 mg Start: 05/08/23 1600  Diet:     Diet   Diet regular Room service appropriate? Yes; Fluid consistency: Thin   Consultants: Neurology   Subjective 05/11/23    Pt reports doing well. Continues to have some visual field deficits. Headache has improved.    Objective   Vitals:   05/11/23 0020 05/11/23 0429 05/11/23 0804 05/11/23 1205  BP: 118/70 129/72 117/72 131/71  Pulse: 68 82 74 66  Resp: 18 20 17 17   Temp: 98.6 F (37 C) 97.8 F (36.6 C) 97.9 F (36.6 C) 97.9 F (36.6 C)  TempSrc:   Oral   SpO2: 100% 100% 100% 100%  Weight:      Height:       No intake or output data in the 24 hours ending 05/11/23 1454  Filed Weights   05/08/23  1535  Weight: 83.4 kg    Physical Exam:  General: awake, alert, NAD HEENT: atraumatic, clear conjunctiva, anicteric sclera, MMM, hearing grossly normal Respiratory: normal respiratory effort. Cardiovascular: quick capillary refill, normal S1/S2, RRR, no JVD, murmurs Gastrointestinal: soft, NT, ND Nervous: A&O x3. normal speech.  Decreased strength in bilateral lower extremities, power 3/5, poor vision and loss of peripheral vision in the right eye, poor historian, difficult to examine. Extremities: moves all equally, no edema,  normal tone Skin: dry, intact, normal temperature, normal color. No rashes, lesions or ulcers on exposed skin Psychiatry: normal mood, congruent affect  Labs   I have personally reviewed the following labs and imaging studies CBC    Component Value Date/Time   WBC 5.3 05/11/2023 1120   RBC 3.41 (L) 05/11/2023 1120   HGB 11.0 (L) 05/11/2023 1120   HCT 32.9 (L) 05/11/2023 1120   PLT 319 05/11/2023 1120   MCV 96.5 05/11/2023 1120   MCH 32.3 05/11/2023 1120   MCHC 33.4 05/11/2023 1120   RDW 13.0 05/11/2023 1120   LYMPHSABS 2.5 02/04/2021 1332   MONOABS 0.7 02/04/2021 1332   EOSABS 0.1 02/04/2021 1332   BASOSABS 0.0 02/04/2021 1332      Latest Ref Rng & Units 05/11/2023   11:20 AM 05/09/2023    2:54 AM 05/08/2023   10:51 PM  BMP  Glucose 70 - 99 mg/dL 82     BUN 8 - 23 mg/dL 13     Creatinine 1.61 - 1.00 mg/dL 0.96     Sodium 045 - 409 mmol/L 141  137  138   Potassium 3.5 - 5.1 mmol/L 4.3     Chloride 98 - 111 mmol/L 111     CO2 22 - 32 mmol/L 21     Calcium 8.9 - 10.3 mg/dL 8.3       No results found.   Disposition Plan & Communication  Patient status: Inpatient  Admitted From: Home Planned disposition location: Home vs CIR? Anticipated discharge date: 12/31 pending neurology clearance, long term cardiac monitoring placement, CIR admission  Family Communication: daughter at bedside  Author: Gillis Santa, MD [facility] 05/11/2023, 2:54 PM   Available by Epic secure chat 7AM-7PM. If 7PM-7AM, please contact night-coverage.  [facility] contact information found on [email or web address].

## 2023-05-11 NOTE — Progress Notes (Signed)
*  PRELIMINARY RESULTS* Echocardiogram 2D Echocardiogram has been performed.  Christine Reyes 05/11/2023, 3:50 PM

## 2023-05-11 NOTE — Progress Notes (Signed)
Physical Therapy Treatment Patient Details Name: Christine Reyes MRN: [id number] DOB: 10/01/1955 Today's Date: 05/11/2023   History of Present Illness Pt is a 67 y.o. female presenting to hospital 05/08/23 with c/o weakness; episode of syncope (hit her head; mild HA since fall).  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  Pt admitted with CVA, hyponatremia, and hypertensive disorder.  PMH includes htn, obesity s/p gastric bypass, R TKA 2019, L knee sx.    PT Comments  Pt resting in bed upon PT arrival; agreeable to therapy; pt reporting vision still blurry/impaired.  Pt reporting 8/10 posterior L neck pain at rest beginning/end of session and 8/10 R hip pain post ambulation: nurse notified of pt's request for pain medication.  During session pt modified independent with bed mobility; CGA to stand up to RW; min assist to stand without UE support; CGA to ambulate 100 feet with walker use (pt feeling unsteady and appearing more unsteady with fatigue so CGA provided for safety); and pt mod assist attempting to walk with single UE support (L)--pt able to take one step forward with R LE but unable to advance L LE (pt reporting feeling like she was going to fall).  Pt assisted back to bed with all needs in reach end of session.  Will continue to focus on strengthening, balance, and progressive functional mobility during hospitalization.    If plan is discharge home, recommend the following: A little help with walking and/or transfers;A little help with bathing/dressing/bathroom;Assistance with cooking/housework;Assist for transportation;Help with stairs or ramp for entrance   Can travel by private vehicle        Equipment Recommendations  Rolling walker (2 wheels) (youth sized)    Recommendations for Other Services       Precautions / Restrictions Precautions Precautions:  Fall Restrictions Weight Bearing Restrictions Per Provider Order: No     Mobility  Bed Mobility Overal bed mobility: Needs Assistance Bed Mobility: Supine to Sit, Sit to Supine     Supine to sit: Modified independent (Device/Increase time), HOB elevated Sit to supine: Modified independent (Device/Increase time), HOB elevated   General bed mobility comments: increased effort to perform on own    Transfers Overall transfer level: Needs assistance Equipment used: Rolling walker (2 wheels), None Transfers: Sit to/from Stand Sit to Stand: Contact guard assist, Min assist           General transfer comment: CGA to stand up to RW; min assist to stand without UE support (assist to steady)    Ambulation/Gait Ambulation/Gait assistance: Contact guard assist, Mod assist Gait Distance (Feet):  (100) Assistive device: Rolling walker (2 wheels) (youth sized) Gait Pattern/deviations: Decreased step length - right, Decreased step length - left Gait velocity: decreased     General Gait Details: mild decreased stance time L LE; assist to steady intermittently with fatigue; attempted ambulation with single UE support (L) but pt only able to take step forward with R LE (unable to advance L LE d/t feeling like she was going to fall)   Optometrist     Tilt Bed    Modified Rankin (Stroke Patients Only)       Balance Overall balance assessment: Needs assistance Sitting-balance support: No upper extremity supported, Feet supported Sitting balance-Leahy Scale: Normal Sitting balance - Comments: steady reaching outside BOS  Standing balance comment: CGA to min assist static standing no UE support                            Cognition Arousal: Alert Behavior During Therapy: WFL for tasks assessed/performed Overall Cognitive Status: No family/caregiver present to determine baseline cognitive functioning Area of Impairment:  Safety/judgement, Following commands                       Following Commands: Follows multi-step commands with increased time Safety/Judgement: Decreased awareness of deficits   Problem Solving: Requires verbal cues          Exercises      General Comments        Pertinent Vitals/Pain Pain Assessment Pain Assessment: 0-10 Pain Score: 8  Pain Location: R hip and posterior L neck Pain Descriptors / Indicators: Aching Pain Intervention(s): Limited activity within patient's tolerance, Monitored during session, Repositioned, Patient requesting pain meds-RN notified HR 76-89 bpm during sessions activities.    Home Living                          Prior Function            PT Goals (current goals can now be found in the care plan section) Acute Rehab PT Goals Patient Stated Goal: to improve walking PT Goal Formulation: With patient Time For Goal Achievement: 05/23/23 Potential to Achieve Goals: Good Progress towards PT goals: Progressing toward goals    Frequency    Min 1X/week      PT Plan      Co-evaluation              AM-PAC PT "6 Clicks" Mobility   Outcome Measure  Help needed turning from your back to your side while in a flat bed without using bedrails?: None Help needed moving from lying on your back to sitting on the side of a flat bed without using bedrails?: None Help needed moving to and from a bed to a chair (including a wheelchair)?: A Little Help needed standing up from a chair using your arms (e.g., wheelchair or bedside chair)?: A Little Help needed to walk in hospital room?: A Little Help needed climbing 3-5 steps with a railing? : A Lot 6 Click Score: 19    End of Session Equipment Utilized During Treatment: Gait belt Activity Tolerance: Patient tolerated treatment well Patient left: in bed;with call bell/phone within reach;with bed alarm set Nurse Communication: Mobility status;Precautions PT Visit Diagnosis:  Unsteadiness on feet (R26.81);Other abnormalities of gait and mobility (R26.89);Muscle weakness (generalized) (M62.81);Hemiplegia and hemiparesis Hemiplegia - Right/Left: Left Hemiplegia - caused by: Cerebral infarction     Time: 1135-1159 PT Time Calculation (min) (ACUTE ONLY): 24 min  Charges:    $Gait Training: 8-22 mins $Therapeutic Activity: 8-22 mins PT General Charges $$ ACUTE PT VISIT: 1 Visit                     Hendricks Limes, PT 05/11/23, 12:21 PM

## 2023-05-11 NOTE — Care Management Important Message (Signed)
Important Message  Patient Details  Name: Christine Reyes MRN: [id number] Date of Birth: 1956/04/19   Important Message Given:  Yes - Medicare IM     Cristela Blue, CMA 05/11/2023, 3:11 PM

## 2023-05-11 NOTE — Progress Notes (Signed)
Occupational Therapy Treatment Patient Details Name: Christine Reyes MRN: [id number] DOB: October 17, 1955 Today's Date: 05/11/2023   History of present illness Pt is a 67 y.o. female presenting to hospital 05/08/23 with c/o weakness; episode of syncope (hit her head; mild HA since fall).  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  Pt admitted with CVA, hyponatremia, and hypertensive disorder.  PMH includes htn, obesity s/p gastric bypass, R TKA 2019, L knee sx.   OT comments  Pt is supine in bed on arrival. Pleasant and agreeable to OT session. She mentioned pain in her neck seemingly min/moderate. Pt performed bed mobility with MOD I and increased effort. CGA for STS from EOB to RW and CGA for mobility into the bathroom, as well as for toilet transfer using grab bar. Able to perform hygiene with SBA on toilet and stand at sink to wash hands with CGA. Pt set up for bathing tasks seated in recliner. UB bathing performed with SUP/set up seated and pt needed Min A for bathing of bil feet and CGA to maintain balance during peri-care. Max A to doff/don new socks. She needed Min A for SPT from recliner back to bed without AD. She remains unsteady without AD use and high fall risk.  Pt returned to bed with all needs in place and will cont to require skilled acute OT services to maximize her safety and IND to return to PLOF.       If plan is discharge home, recommend the following:  A little help with bathing/dressing/bathroom;A lot of help with walking and/or transfers;Assistance with cooking/housework;Direct supervision/assist for medications management;Assist for transportation;Help with stairs or ramp for entrance   Equipment Recommendations  Other (comment) (defer)    Recommendations for Other Services      Precautions / Restrictions Precautions Precautions: Fall Restrictions Weight Bearing  Restrictions Per Provider Order: No       Mobility Bed Mobility Overal bed mobility: Needs Assistance Bed Mobility: Supine to Sit, Sit to Supine     Supine to sit: Modified independent (Device/Increase time), HOB elevated Sit to supine: Modified independent (Device/Increase time), HOB elevated   General bed mobility comments: increased effort    Transfers Overall transfer level: Needs assistance Equipment used: Rolling walker (2 wheels), None Transfers: Sit to/from Stand Sit to Stand: Contact guard assist, Min assist     Step pivot transfers: Min assist     General transfer comment: CGA to stand from EOB to RW; CGA for mobility using RW to bathroom<>back and Min A for step pivot transfer without AD use from recliner back to bed after bathing     Balance Overall balance assessment: Needs assistance Sitting-balance support: No upper extremity supported, Feet supported Sitting balance-Leahy Scale: Good Sitting balance - Comments: steady reaching outside BOS for bathing, excluding inability to reach her feet   Standing balance support: Bilateral upper extremity supported, During functional activity, Reliant on assistive device for balance Standing balance-Leahy Scale: Poor Standing balance comment: CGA to min assist static standing no UE support                           ADL either performed or assessed with clinical judgement   ADL Overall ADL's : Needs assistance/impaired     Grooming: Wash/dry face;Sitting;Set up;Wash/dry hands;Standing;Contact guard assist   Upper Body Bathing: Supervision/ safety;Sitting   Lower Body Bathing: Sit  to/from stand;Minimal assistance Lower Body Bathing Details (indicate cue type and reason): Min A to bathe bil feet d/t pt unable to reach     Lower Body Dressing: Maximal assistance;Sitting/lateral leans Lower Body Dressing Details (indicate cue type and reason): to doff/don socks seated in recliner Toilet Transfer: Regular  Toilet;Contact guard assist;Grab bars Toilet Transfer Details (indicate cue type and reason): CGA for toilet transfer using grab bar and RW Toileting- Clothing Manipulation and Hygiene: Contact guard assist;Sitting/lateral lean Toileting - Clothing Manipulation Details (indicate cue type and reason): CGA for hygiene on toilet            Extremity/Trunk Assessment              Vision       Perception     Praxis      Cognition Arousal: Alert Behavior During Therapy: WFL for tasks assessed/performed Overall Cognitive Status: No family/caregiver present to determine baseline cognitive functioning Area of Impairment: Safety/judgement, Following commands                       Following Commands: Follows multi-step commands with increased time Safety/Judgement: Decreased awareness of deficits   Problem Solving: Requires verbal cues          Exercises      Shoulder Instructions       General Comments HR up to 125 on tele with activity    Pertinent Vitals/ Pain       Pain Assessment Pain Assessment: Faces Faces Pain Scale: Hurts a little bit Pain Location: R hip and posterior L neck Pain Descriptors / Indicators: Aching Pain Intervention(s): Monitored during session, Limited activity within patient's tolerance, Repositioned  Home Living                                          Prior Functioning/Environment              Frequency  Min 1X/week        Progress Toward Goals  OT Goals(current goals can now be found in the care plan section)  Progress towards OT goals: Progressing toward goals  Acute Rehab OT Goals Patient Stated Goal: return to PLOF OT Goal Formulation: With patient Time For Goal Achievement: 05/23/23 Potential to Achieve Goals: Good  Plan      Co-evaluation                 AM-PAC OT "6 Clicks" Daily Activity     Outcome Measure   Help from another person eating meals?: None Help from  another person taking care of personal grooming?: None Help from another person toileting, which includes using toliet, bedpan, or urinal?: A Little Help from another person bathing (including washing, rinsing, drying)?: A Lot Help from another person to put on and taking off regular upper body clothing?: A Little Help from another person to put on and taking off regular lower body clothing?: A Lot 6 Click Score: 18    End of Session Equipment Utilized During Treatment: Rolling walker (2 wheels)  OT Visit Diagnosis: Other abnormalities of gait and mobility (R26.89);Muscle weakness (generalized) (M62.81);Other symptoms and signs involving the nervous system (R29.898);Hemiplegia and hemiparesis   Activity Tolerance Patient tolerated treatment well   Patient Left in bed;with call bell/phone within reach;with bed alarm set   Nurse Communication  Time: 1610-9604 OT Time Calculation (min): 25 min  Charges: OT General Charges $OT Visit: 1 Visit OT Treatments $Self Care/Home Management : 23-37 mins  Lane Eland, OTR/L  05/11/23, 3:19 PM   Michal Strzelecki E Nareh Matzke 05/11/2023, 3:16 PM

## 2023-05-11 NOTE — Progress Notes (Signed)
Inpatient Rehab Admissions Coordinator:  Started insurance authorization. Will continue to follow.   Gayland Curry, [place], [place] Admissions Coordinator [phone]

## 2023-05-12 ENCOUNTER — Inpatient Hospital Stay: Payer: 59 | Attending: Nurse Practitioner

## 2023-05-12 ENCOUNTER — Telehealth: Payer: Self-pay | Admitting: *Deleted

## 2023-05-12 ENCOUNTER — Inpatient Hospital Stay (HOSPITAL_COMMUNITY)
Admit: 2023-05-12 | Discharge: 2023-05-12 | Disposition: A | Discharge status: Home / Self Care | Payer: 59 | Attending: Student | Admitting: Student

## 2023-05-12 DIAGNOSIS — I6389 Other cerebral infarction: Secondary | ICD-10-CM | POA: Diagnosis not present

## 2023-05-12 DIAGNOSIS — I639 Cerebral infarction, unspecified: Secondary | ICD-10-CM

## 2023-05-12 LAB — ECHOCARDIOGRAM COMPLETE BUBBLE STUDY
AR max vel: 2.57 cm2
AV Area VTI: 2.67 cm2
AV Area mean vel: 2.98 cm2
AV Mean grad: 2 mm[Hg]
AV Peak grad: 5.3 mm[Hg]
Ao pk vel: 1.15 m/s
Area-P 1/2: 4.15 cm2
MV VTI: 1.95 cm2
S' Lateral: 2.5 cm

## 2023-05-12 MED ORDER — ACETAMINOPHEN 325 MG PO TABS
650.0000 mg | ORAL_TABLET | Freq: Three times a day (TID) | ORAL | Status: DC
  Administered 2023-05-12 – 2023-05-14 (×5): 650 mg via ORAL
  Filled 2023-05-12 (×6): qty 2

## 2023-05-12 MED ORDER — ACETAMINOPHEN 325 MG PO TABS: 650.0000 mg | ORAL_TABLET | Freq: Four times a day (QID) | ORAL | Status: DC | PRN

## 2023-05-12 MED ORDER — ALUM & MAG HYDROXIDE-SIMETH 200-200-20 MG/5ML PO SUSP: 30.0000 mL | ORAL | Status: DC | PRN

## 2023-05-12 MED ORDER — ONDANSETRON HCL 4 MG/2ML IJ SOLN
4.0000 mg | Freq: Once | INTRAMUSCULAR | Status: AC
  Administered 2023-05-12: 4 mg via INTRAVENOUS
  Filled 2023-05-12: qty 2

## 2023-05-12 MED ORDER — DICLOFENAC SODIUM 1 % EX GEL
4.0000 g | Freq: Three times a day (TID) | CUTANEOUS | Status: AC
  Administered 2023-05-12: 4 g via TOPICAL
  Filled 2023-05-12: qty 100

## 2023-05-12 MED ORDER — METHOCARBAMOL 500 MG PO TABS
500.0000 mg | ORAL_TABLET | Freq: Once | ORAL | Status: AC
  Administered 2023-05-12: 500 mg via ORAL
  Filled 2023-05-12: qty 1

## 2023-05-12 MED ORDER — DICLOFENAC SODIUM 1 % EX GEL
4.0000 g | Freq: Three times a day (TID) | CUTANEOUS | Status: DC | PRN
Start: 2023-05-13 — End: 2023-05-14
  Filled 2023-05-12: qty 100

## 2023-05-12 NOTE — H&P (Incomplete)
Physical Medicine and Rehabilitation Admission H&P    Chief Complaint  Patient presents with   Weakness  : HPI: Christine Reyes. Dangelo is a 67 year old right-handed female with history significant for anxiety, hypertension, iron deficiency anemia, obesity status post gastric bypass with BMI 39.77, quit smoking 7 years ago, right TKA 2019 complicated by DVT.  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  1 level home 3 steps to entry.  Independent prior to admission.  There is a daughter as well as a granddaughter with good support.  Presented to [facility] 05/08/2023 after reported fall with altered mental status/headache, generalized weakness as well as blurred vision.  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  CT cervical spine negative.  CTA of head and neck with no emergent large vessel occlusion.  MRI showed acute infarct in the bilateral occipital lobes, left greater than right.  The bilateral occipital infarcts with foci of hemosiderin deposition possibly representing thromboemboli.  Additional punctate acute infarct in the right cerebellum and possible acute infarct in the right lateral pons.  Patient did not receive tPA.  Bilateral lower extremity ultrasound showed no evidence of DVT in either lower extremity and resolution of left posterior tibial vein DVT.  Admission chemistries unremarkable except sodium of 125, CO2 of 16, osmolality 273, urine osmolality 686, free T4 0.56, TSH 4.077.  Echocardiogram with ejection fraction of 55 to 60% no wall motion abnormalities grade 1 diastolic dysfunction.  Neurology follow-up placed on aspirin 81 mg daily and Plavix 75 mg daily for CVA prophylaxis x 3 weeks then aspirin alone.  Lovenox added for DVT prophylaxis.  Hyponatremia resolved after initial fluid restriction latest sodium of 141.  Tolerating a regular consistency diet.  Therapy evaluations completed due to patient's decreased  functional mobility was admitted for a comprehensive rehab program.  Review of Systems  Constitutional:  Negative for chills and fever.  HENT:  Negative for hearing loss.   Eyes:  Positive for blurred vision.  Respiratory:  Negative for cough, shortness of breath and wheezing.   Cardiovascular:  Negative for chest pain, palpitations and leg swelling.  Gastrointestinal:  Positive for constipation. Negative for heartburn, nausea and vomiting.       GERD  Genitourinary:  Negative for dysuria, flank pain and hematuria.  Musculoskeletal:  Positive for falls, joint pain and myalgias.  Skin:  Negative for rash.  Neurological:  Positive for weakness and headaches.  Psychiatric/Behavioral:  The patient has insomnia.        Anxiety  All other systems reviewed and are negative.  Past Medical History:  Diagnosis Date   Anxiety    Arthritis    Headache    Hypertension    Iron deficiency anemia 06/17/2018   Obesity    Sleep apnea    Past Surgical History:  Procedure Laterality Date   ABDOMINAL HYSTERECTOMY     BREAST EXCISIONAL BIOPSY Right 1989   CHOLECYSTECTOMY     COLONOSCOPY WITH ESOPHAGOGASTRODUODENOSCOPY (EGD)     csection x2     DILATION AND CURETTAGE OF UTERUS     ESOPHAGOGASTRODUODENOSCOPY (EGD) WITH PROPOFOL N/A 12/14/2015   Procedure: ESOPHAGOGASTRODUODENOSCOPY (EGD) WITH PROPOFOL;  Surgeon: Scot Jun, MD;  Location: [facility] ENDOSCOPY;  Service: Endoscopy;  Laterality: N/A;   GASTRIC BYPASS     JOINT REPLACEMENT     left knee surgery     TOTAL KNEE ARTHROPLASTY Right 06/22/2017   Procedure:  TOTAL KNEE ARTHROPLASTY;  Surgeon: Lyndle Herrlich, MD;  Location: [facility] ORS;  Service: Orthopedics;  Laterality: Right;   Family History  Problem Relation Age of Onset   Hypertension Mother    Arthritis Mother    Cancer Father    Bipolar disorder Daughter    Breast cancer Neg Hx    Social History:  reports that she quit smoking about 7 years ago. Her smoking use included cigarettes.  She has quit using smokeless tobacco. She reports current alcohol use of about 2.0 standard drinks of alcohol per week. She reports that she does not use drugs. Allergies:  Allergies  Allergen Reactions   Morphine And Codeine Itching   Tramadol Itching   Medications Prior to Admission  Medication Sig Dispense Refill   Aspirin-Salicylamide-Caffeine (ARTHRITIS STRENGTH BC POWDER PO) Take by mouth.     gabapentin (NEURONTIN) 300 MG capsule Take 1 capsule (300 mg total) by mouth 2 (two) times daily. 60 capsule 2   Iron-Vitamin C 65-125 MG TABS Take 1 tablet by mouth daily. 30 tablet 3   naloxone (NARCAN) 4 MG/0.1ML LIQD nasal spray kit Inhale 4 mg into the lungs as needed.     olmesartan-hydrochlorothiazide (BENICAR HCT) 40-25 MG tablet Take 1 tablet by mouth daily.  5   ondansetron (ZOFRAN-ODT) 4 MG disintegrating tablet Take 1 tablet (4 mg total) by mouth every 8 (eight) hours as needed for nausea or vomiting. 20 tablet 0   Oxycodone HCl 20 MG TABS Take 20 mg by mouth 2 (two) times daily as needed.     pantoprazole (PROTONIX) 40 MG tablet Take 40 mg by mouth 2 (two) times daily as needed (heartburn).      dicyclomine (BENTYL) 20 MG tablet Take 1 tablet (20 mg total) by mouth 4 (four) times daily -  before meals and at bedtime for 7 days. (Patient not taking: Reported on 05/08/2023) 28 tablet 0   ibuprofen (ADVIL) 600 MG tablet Take 1 tablet (600 mg total) by mouth every 8 (eight) hours as needed for moderate pain. (Patient not taking: Reported on 05/08/2023) 20 tablet 0   Oxycodone HCl 20 MG TABS Take by mouth. (Patient not taking: Reported on 02/19/2021)        Home: Home Living Family/patient expects to be discharged to:: Private residence Living Arrangements: Parent Available Help at Discharge: Family, Available PRN/intermittently Type of Home: House Home Access: Ramped entrance Entrance Stairs-Number of Steps: 3 Entrance Stairs-Rails: Can reach both Home Layout: One level Bathroom  Shower/Tub: Engineer, manufacturing systems: Handicapped height Bathroom Accessibility: Yes Home Equipment: Grab bars - toilet  Lives With: Other (Comment) (mother. Mother will be going to live with sister soon)   Functional History: Prior Function Prior Level of Function : Independent/Modified Independent, History of Falls (last six months) Mobility Comments: Ambulatory with no AD. 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  Functional Status:  Mobility: Bed Mobility Overal bed mobility: Needs Assistance Bed Mobility: Supine to Sit, Sit to Supine Supine to sit: Modified independent (Device/Increase time), HOB elevated Sit to supine: Modified independent (Device/Increase time), HOB elevated General bed mobility comments: increased effort Transfers Overall transfer level: Needs assistance Equipment used: Rolling walker (2 wheels), None Transfers: Sit to/from Stand Sit to Stand: Contact guard assist, Min assist Bed to/from chair/wheelchair/BSC transfer type:: Step pivot Step pivot transfers: Min assist General transfer comment: CGA to stand from EOB to RW; CGA for mobility using RW to  bathroom<>back and Min A for step pivot transfer without AD use from recliner back to bed after bathing Ambulation/Gait Ambulation/Gait assistance: Contact guard assist, Mod assist Gait Distance (Feet):  (100) Assistive device: Rolling walker (2 wheels) (youth sized) Gait Pattern/deviations: Decreased step length - right, Decreased step length - left General Gait Details: mild decreased stance time L LE; assist to steady intermittently with fatigue; attempted ambulation with single UE support (L) but pt only able to take step forward with R LE (unable to advance L LE d/t feeling like she was going to fall) Gait velocity: decreased    ADL: ADL Overall ADL's : Needs assistance/impaired Eating/Feeding: Supervision/ safety,  Sitting Grooming: Wash/dry face, Sitting, Set up, Wash/dry hands, Standing, Contact guard assist Upper Body Bathing: Supervision/ safety, Sitting Lower Body Bathing: Sit to/from stand, Minimal assistance Lower Body Bathing Details (indicate cue type and reason): Min A to bathe bil feet d/t pt unable to reach Lower Body Dressing: Maximal assistance, Sitting/lateral leans Lower Body Dressing Details (indicate cue type and reason): to doff/don socks seated in recliner Toilet Transfer: Regular Toilet, Contact guard assist, Grab bars Toilet Transfer Details (indicate cue type and reason): CGA for toilet transfer using grab bar and RW Toileting- Clothing Manipulation and Hygiene: Contact guard assist, Sitting/lateral lean Toileting - Clothing Manipulation Details (indicate cue type and reason): CGA for hygiene on toilet Functional mobility during ADLs:  (approx 6' in room with MIN-MOD A with HHA, pt reports feeling dizzy therefore returned to seated on edge of bed)  Cognition: Cognition Overall Cognitive Status: No family/caregiver present to determine baseline cognitive functioning Arousal/Alertness: Awake/alert Orientation Level: Oriented X4 Memory: Impaired Memory Impairment: Retrieval deficit Awareness: Appears intact Problem Solving: Appears intact Executive Function: Organizing, Reasoning Reasoning: Appears intact Organizing: Impaired Organizing Impairment: Functional complex, Verbal complex Safety/Judgment: Appears intact Cognition Arousal: Alert Behavior During Therapy: WFL for tasks assessed/performed Overall Cognitive Status: No family/caregiver present to determine baseline cognitive functioning Area of Impairment: Safety/judgement, Following commands Following Commands: Follows multi-step commands with increased time Safety/Judgement: Decreased awareness of deficits Awareness: Emergent Problem Solving: Requires verbal cues  Physical Exam: Blood pressure 101/64, pulse 70,  temperature (!) 97.4 F (36.3 C), temperature source Oral, resp. rate 18, height 4\' 9"  (1.448 m), weight 83.4 kg, SpO2 97%. Physical Exam Neurological:     Comments: Patient is alert.  No acute distress.  Exhibits some right neglect.  Follows simple commands.  She can provide month and appropriate age but not current year.  She did have some difficulty with serial sevens with delay in response     Results for orders placed or performed during the hospital encounter of 05/08/23 (from the past 48 hours)  Glucose, capillary     Status: None   Collection Time: 05/11/23  4:31 AM  Result Value Ref Range   Glucose-Capillary 79 70 - 99 mg/dL    Comment: Glucose reference range applies only to samples taken after fasting for at least 8 hours.  Folate     Status: None   Collection Time: 05/11/23 11:20 AM  Result Value Ref Range   Folate 6.4 >5.9 ng/mL    Comment: Performed at [facility], [address] [place], [place] [address]  Iron and TIBC     Status: None   Collection Time: 05/11/23 11:20 AM  Result Value Ref Range   Iron 82 28 - 170 ug/dL   TIBC 914 782 - 956 ug/dL   Saturation Ratios 24 10.4 - 31.8 %   UIBC  254 ug/dL    Comment: Performed at [facility], [address] [place], [place] [address]  Basic metabolic panel     Status: Abnormal   Collection Time: 05/11/23 11:20 AM  Result Value Ref Range   Sodium 141 135 - 145 mmol/L   Potassium 4.3 3.5 - 5.1 mmol/L   Chloride 111 98 - 111 mmol/L   CO2 21 (L) 22 - 32 mmol/L   Glucose, Bld 82 70 - 99 mg/dL    Comment: Glucose reference range applies only to samples taken after fasting for at least 8 hours.   BUN 13 8 - 23 mg/dL   Creatinine, Ser 9.14 0.44 - 1.00 mg/dL   Calcium 8.3 (L) 8.9 - 10.3 mg/dL   GFR, Estimated >78 >29 mL/min    Comment: (NOTE) Calculated using the CKD-EPI Creatinine Equation (2021)    Anion gap 9 5 - 15    Comment: Performed at [facility], [address]  [place], [place] [address]  CBC     Status: Abnormal   Collection Time: 05/11/23 11:20 AM  Result Value Ref Range   WBC 5.3 4.0 - 10.5 K/uL   RBC 3.41 (L) 3.87 - 5.11 MIL/uL   Hemoglobin 11.0 (L) 12.0 - 15.0 g/dL   HCT 08.6 (L) 57.8 - 46.9 %   MCV 96.5 80.0 - 100.0 fL   MCH 32.3 26.0 - 34.0 pg   MCHC 33.4 30.0 - 36.0 g/dL   RDW 62.9 52.8 - 41.3 %   Platelets 319 150 - 400 K/uL   nRBC 0.0 0.0 - 0.2 %    Comment: Performed at [facility], [address] [place], [place] [address]  Magnesium     Status: None   Collection Time: 05/11/23 11:20 AM  Result Value Ref Range   Magnesium 1.8 1.7 - 2.4 mg/dL    Comment: Performed at [facility], [address] [place], [place] [address]  Phosphorus     Status: None   Collection Time: 05/11/23 11:20 AM  Result Value Ref Range   Phosphorus 3.4 2.5 - 4.6 mg/dL    Comment: Performed at [facility], [address] [place], [place] [address]  T4, free     Status: Abnormal   Collection Time: 05/11/23 11:20 AM  Result Value Ref Range   Free T4 0.56 (L) 0.61 - 1.12 ng/dL    Comment: (NOTE) Biotin ingestion may interfere with free T4 tests. If the results are inconsistent with the TSH level, previous test results, or the clinical presentation, then consider biotin interference. If needed, order repeat testing after stopping biotin. Performed at [facility], [address] [place], [place] [address]   TSH     Status: None   Collection Time: 05/11/23 11:20 AM  Result Value Ref Range   TSH 4.077 0.350 - 4.500 uIU/mL    Comment: Performed by a 3rd Generation assay with a functional sensitivity of <=0.01 uIU/mL. Performed at [facility], [address] [place], [place] [address]   VITAMIN D 25 Hydroxy (Vit-D Deficiency, Fractures)     Status: None   Collection Time: 05/11/23 12:33 PM  Result Value Ref Range   Vit D, 25-Hydroxy 34.31 30 - 100 ng/mL    Comment: (NOTE) Vitamin D deficiency has  been defined by the Institute of Medicine  and an Endocrine Society practice guideline as a level of serum 25-OH  vitamin D less than 20 ng/mL (1,2). The Endocrine Society went  on to  further define vitamin D insufficiency as a level between 21 and 29  ng/mL (2).  1. IOM (Institute of Medicine). 2010. Dietary reference intakes for  calcium and D. [place]: [employer]. 2. Holick MF, Binkley Datto, Bischoff-Ferrari HA, et al. Evaluation,  treatment, and prevention of vitamin D deficiency: an Endocrine  Society clinical practice guideline, JCEM. 2011 Jul; 96(7): 1911-30.  Performed at [facility], [address] [place], [place] [address]   Vitamin B12     Status: None   Collection Time: 05/11/23 12:33 PM  Result Value Ref Range   Vitamin B-12 590 180 - 914 pg/mL    Comment: (NOTE) This assay is not validated for testing neonatal or myeloproliferative syndrome specimens for Vitamin B12 levels. Performed at [facility], [address], [place], [place] [address]    ECHOCARDIOGRAM COMPLETE Result Date: 05/11/2023    ECHOCARDIOGRAM REPORT   Patient Name:   Christine Reyes Date of Exam: 05/11/2023 Medical Rec #:  [id number]       Height:       57.0 in Accession #:    [id number]      Weight:       183.8 lb Date of Birth:  [birth year]      BSA:          1.735 m Patient Age:    67 years        BP:           131/71 mmHg Patient Gender: F               HR:           70 bpm. Exam Location:  [facility] Procedure: 2D Echo, Cardiac Doppler and Color Doppler Indications:     TIA  History:         Patient has no prior history of Echocardiogram examinations.                  CHF, TIA and Stroke; Risk Factors:Hypertension and Sleep Apnea.  Sonographer:     Mikki Harbor Referring Phys:  [phone] Francoise Schaumann [place]ON Diagnosing Phys: Alwyn Pea MD  Sonographer Comments: Image acquisition challenging due to respiratory motion. IMPRESSIONS  1. Left ventricular ejection fraction, by  estimation, is 55 to 60%. The left ventricle has normal function. The left ventricle has no regional wall motion abnormalities. Left ventricular diastolic parameters are consistent with Grade I diastolic dysfunction (impaired relaxation).  2. Right ventricular systolic function is normal. The right ventricular size is normal. There is normal pulmonary artery systolic pressure.  3. The mitral valve is grossly normal. No evidence of mitral valve regurgitation.  4. The aortic valve is normal in structure. Aortic valve regurgitation is not visualized. Aortic valve sclerosis is present, with no evidence of aortic valve stenosis. FINDINGS  Left Ventricle: Left ventricular ejection fraction, by estimation, is 55 to 60%. The left ventricle has normal function. The left ventricle has no regional wall motion abnormalities. The left ventricular internal cavity size was normal in size. There is  no left ventricular hypertrophy. Left ventricular diastolic parameters are consistent with Grade I diastolic dysfunction (impaired relaxation). Right Ventricle: The right ventricular size is normal. No increase in right ventricular wall thickness. Right ventricular systolic function is normal. There is normal pulmonary artery systolic pressure. The tricuspid regurgitant velocity is 2.57 m/s, and  with an assumed right atrial pressure of 3 mmHg, the estimated right ventricular systolic  pressure is 29.4 mmHg. Left Atrium: Left atrial size was normal in size. Right Atrium: Right atrial size was normal in size. Pericardium: There is no evidence of pericardial effusion. Mitral Valve: The mitral valve is grossly normal. No evidence of mitral valve regurgitation. MV peak gradient, 5.3 mmHg. The mean mitral valve gradient is 2.0 mmHg. Tricuspid Valve: The tricuspid valve is normal in structure. Tricuspid valve regurgitation is trivial. Aortic Valve: The aortic valve is normal in structure. Aortic valve regurgitation is not visualized. Aortic  valve sclerosis is present, with no evidence of aortic valve stenosis. Aortic valve mean gradient measures 6.0 mmHg. Aortic valve peak gradient measures 13.0 mmHg. Aortic valve area, by VTI measures 2.06 cm. Pulmonic Valve: The pulmonic valve was normal in structure. Pulmonic valve regurgitation is not visualized. Aorta: The ascending aorta was not well visualized. IAS/Shunts: No atrial level shunt detected by color flow Doppler.  LEFT VENTRICLE PLAX 2D LVIDd:         4.20 cm     Diastology LVIDs:         2.70 cm     LV e' medial:    10.10 cm/s LV PW:         0.80 cm     LV E/e' medial:  8.3 LV IVS:        1.10 cm     LV e' lateral:   12.40 cm/s LVOT diam:     1.80 cm     LV E/e' lateral: 6.8 LV SV:         76 LV SV Index:   44 LVOT Area:     2.54 cm  LV Volumes (MOD) LV vol d, MOD A2C: 39.1 ml LV vol d, MOD A4C: 44.7 ml LV vol s, MOD A2C: 19.1 ml LV vol s, MOD A4C: 17.5 ml LV SV MOD A2C:     20.0 ml LV SV MOD A4C:     44.7 ml LV SV MOD BP:      23.5 ml RIGHT VENTRICLE RV Basal diam:  3.00 cm RV Mid diam:    2.50 cm RV S prime:     11.40 cm/s LEFT ATRIUM             Index        RIGHT ATRIUM           Index LA diam:        3.10 cm 1.79 cm/m   RA Area:     11.70 cm LA Vol (A2C):   53.0 ml 30.54 ml/m  RA Volume:   22.60 ml  13.02 ml/m LA Vol (A4C):   40.3 ml 23.22 ml/m LA Biplane Vol: 48.7 ml 28.07 ml/m  AORTIC VALVE                     PULMONIC VALVE AV Area (Vmax):    2.06 cm      PV Vmax:       1.00 m/s AV Area (Vmean):   1.87 cm      PV Peak grad:  4.0 mmHg AV Area (VTI):     2.06 cm AV Vmax:           180.00 cm/s AV Vmean:          115.000 cm/s AV VTI:            0.370 m AV Peak Grad:      13.0 mmHg AV Mean Grad:      6.0 mmHg  LVOT Vmax:         146.00 cm/s LVOT Vmean:        84.300 cm/s LVOT VTI:          0.299 m LVOT/AV VTI ratio: 0.81  AORTA Ao Root diam: 3.00 cm MITRAL VALVE                TRICUSPID VALVE MV Area (PHT): 2.73 cm     TR Peak grad:   26.4 mmHg MV Area VTI:   2.46 cm     TR Vmax:         257.00 cm/s MV Peak grad:  5.3 mmHg MV Mean grad:  2.0 mmHg     SHUNTS MV Vmax:       1.15 m/s     Systemic VTI:  0.30 m MV Vmean:      58.3 cm/s    Systemic Diam: 1.80 cm MV Decel Time: 278 msec MV E velocity: 83.80 cm/s MV A velocity: 106.00 cm/s MV E/A ratio:  0.79 Dwayne D Callwood MD Electronically signed by Alwyn Pea MD Signature Date/Time: 05/11/2023/6:24:25 PM    Final       Blood pressure 101/64, pulse 70, temperature (!) 97.4 F (36.3 C), temperature source Oral, resp. rate 18, height 4\' 9"  (1.448 m), weight 83.4 kg, SpO2 97%.  Medical Problem List and Plan: 1. 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  Antithrombotics: -DVT/anticoagulation:  Pharmaceutical: Lovenox  -antiplatelet therapy: Aspirin 81 mg daily and Plavix 75 mg day x 3 weeks then aspirin alone 3. Pain Management: Neurontin 300 mg 3 times daily, baclofen 5 mg 3 times daily muscle spasms, Voltaren 4 g 4 times daily as needed, oxycodone 5 mg every 6 hours as needed 4. Mood/Behavior/Sleep: Trazodone 100 mg nightly.  Provide emotional support  -antipsychotic agents: N/A 5. Neuropsych/cognition: This patient is not capable of making decisions on her own behalf. 6. Skin/Wound Care: Routine skin checks 7. Fluids/Electrolytes/Nutrition: Routine in and outs with follow-up chemistries 8.  Hyponatremia.  Follow-up chemistries 9.  Hyperlipidemia.  Lipitor 10.  Obesity.  History of gastric bypass.  BMI 39.77.  Dietary follow-up 11.  GERD.  Protonix 12.  History of iron deficiency anemia.  Follow-up chemistries 13.  Permissive hypertension.  Patient on Benicar HCT 40-25 mg tablet daily prior to admission.  Resume as needed  Charlton Amor, PA-C 05/12/2023

## 2023-05-12 NOTE — Progress Notes (Signed)
 Inpatient Rehabilitation Admissions Coordinator   I have received approval for CIR admit. We can plan to admit Thursday when bed is available. I spoke to patient by phone and she is aware.  Heron Leavell, RN, MSN Rehab Admissions Coordinator [phone] 05/12/2023 5:17 PM

## 2023-05-12 NOTE — Telephone Encounter (Signed)
 Received request for zio monitor to be ordered for patient. Order placed and will call patient to verify address.    Called patient to review monitor will be shipped to her and confirmed mailing address. She verbalized understanding with no further questions at this time.

## 2023-05-12 NOTE — Progress Notes (Signed)
 Occupational Therapy Treatment Patient Details Name: Christine Reyes MRN: [id number] DOB: 05/19/1955 Today's Date: 05/12/2023   History of present illness Pt is a 67 y.o. female presenting to hospital 05/08/23 with c/o weakness; episode of syncope (hit her head; mild HA since fall).  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  Pt admitted with CVA, hyponatremia, and hypertensive disorder.  PMH includes htn, obesity s/p gastric bypass, R TKA 2019, L knee sx.   OT comments  Pt is supine in bed on arrival. Pleasant and agreeable to OT session. She reports 8/10 pain in her R posterior neck and bil shoulders radiating down to her lower back on the R side-MD and nurse notified. ROM performed at bed level to include PROM, AAROM, and AROM with pt remaining with deficits and limited by pain. She continues with weakness and coordination deficits affecting her L dominant UE. Increased effort to sit at EOB with SUP using bed rail. OT provided gentle tissue massage to neck and shoulders with application of gel pain cream. Performed memory assessment during session, pt unable to recall 3/3 words after ~1 min of conversation. Recall 2/3 words correctly. Pt able to write her name and address with increased time and focus required to complete d/t coordination and memory deficits. Pt left seated at EOB with handoff to PT. She will cont to require skilled acute OT services to maximize her safety and IND to return to PLOF.       If plan is discharge home, recommend the following:  A little help with bathing/dressing/bathroom;A lot of help with walking and/or transfers;Assistance with cooking/housework;Direct supervision/assist for medications management;Assist for transportation;Help with stairs or ramp for entrance   Equipment Recommendations  Other (comment) (defer)    Recommendations for Other Services       Precautions / Restrictions Precautions Precautions: Fall Restrictions Weight Bearing Restrictions Per Provider Order: No       Mobility Bed Mobility Overal bed mobility: Needs Assistance Bed Mobility: Sit to Supine     Supine to sit: Supervision, Used rails, HOB elevated     General bed mobility comments: increased time and effort to reach EOB today-possibly due to pain    Transfers                         Balance Overall balance assessment: Needs assistance Sitting-balance support: No upper extremity supported, Feet supported Sitting balance-Leahy Scale: Good Sitting balance - Comments: steady sitting reaching within BOS                                   ADL either performed or assessed with clinical judgement   ADL                                              Extremity/Trunk Assessment Upper Extremity Assessment Upper Extremity Assessment: Left hand dominant;LUE deficits/detail;Generalized weakness LUE Deficits / Details: 3/4 full ROM remains in shoulders likely d/t pain, continued weakness throughout LUE Coordination: decreased fine motor;decreased gross motor   Lower Extremity Assessment Lower Extremity Assessment: Defer to PT evaluation        Vision       Perception  Praxis      Cognition Arousal: Alert Behavior During Therapy: WFL for tasks assessed/performed Overall Cognitive Status: No family/caregiver present to determine baseline cognitive functioning Area of Impairment: Safety/judgement, Following commands                       Following Commands: Follows multi-step commands with increased time Safety/Judgement: Decreased awareness of deficits   Problem Solving: Requires verbal cues, Slow processing          Exercises Other Exercises Other Exercises: Performed memory assessment during session, pt unable to recall 3/3 words after ~1 min of conversation. Recall 2/3 words  correctly. Other Exercises: Pt able to write her name and address with increased time and focus required to complete d/t coordination and memory deficits. Other Exercises: BUE ROM HEP done at bed level d/t increased pain, pt with 3/4 full AROM to bil shoulders with increased pain to her R posterior neck and shoulders. x10 reps performed on BUEs, RROM to L wrist x10 reps and pt with continued weak grip.    Shoulder Instructions       General Comments      Pertinent Vitals/ Pain       Pain Assessment Pain Assessment: 0-10 Pain Score: 8  Pain Location: R lateral/posterior neck pain Pain Descriptors / Indicators: Aching, Tender, Discomfort, Guarding Pain Intervention(s): Limited activity within patient's tolerance, Monitored during session, Premedicated before session, Repositioned, Patient requesting pain meds-RN notified (MD notified)  Home Living                                          Prior Functioning/Environment              Frequency  Min 1X/week        Progress Toward Goals  OT Goals(current goals can now be found in the care plan section)  Progress towards OT goals: Progressing toward goals  Acute Rehab OT Goals Patient Stated Goal: return to PLOF OT Goal Formulation: With patient Time For Goal Achievement: 05/23/23 Potential to Achieve Goals: Good  Plan      Co-evaluation                 AM-PAC OT 6 Clicks Daily Activity     Outcome Measure   Help from another person eating meals?: None Help from another person taking care of personal grooming?: None Help from another person toileting, which includes using toliet, bedpan, or urinal?: A Little Help from another person bathing (including washing, rinsing, drying)?: A Lot Help from another person to put on and taking off regular upper body clothing?: A Little Help from another person to put on and taking off regular lower body clothing?: A Lot 6 Click Score: 18    End of  Session    OT Visit Diagnosis: Other abnormalities of gait and mobility (R26.89);Muscle weakness (generalized) (M62.81);Other symptoms and signs involving the nervous system (R29.898);Hemiplegia and hemiparesis Hemiplegia - Right/Left: Left Hemiplegia - dominant/non-dominant: Dominant   Activity Tolerance Patient tolerated treatment well   Patient Left in bed;with call bell/phone within reach;with bed alarm set   Nurse Communication          Time: 8648-8570 OT Time Calculation (min): 38 min  Charges: OT General Charges $OT Visit: 1 Visit OT Treatments $Neuromuscular Re-education: 38-52 mins  Camauri Craton, OTR/L  05/12/23, 3:24 PM   Amery Minasyan E Nicoya Friel  05/12/2023, 3:21 PM

## 2023-05-12 NOTE — Progress Notes (Signed)
*  PRELIMINARY RESULTS* Echocardiogram 2D Echocardiogram has been performed.  Cristela Blue 05/12/2023, 8:57 AM

## 2023-05-12 NOTE — Progress Notes (Addendum)
 Inpatient Rehabilitation Admissions Coordinator   I await insurance approval for possible Cir admit.  Heron Leavell, RN, MSN Rehab Admissions Coordinator [phone] 05/12/2023 10:18 AM  I spoke with patient and her sister, Myra, by phone at her bedside. They are aware that I have not received approval and likely will take 48 to 72 hrs for that determination from [facility] medicare. Auth began 12/30.  Heron Leavell, RN, MSN Rehab Admissions Coordinator [phone] 05/12/2023 12:53 PM

## 2023-05-12 NOTE — Progress Notes (Signed)
 PROGRESS NOTE  Christine Reyes    DOB: 09-Mar-1956, 67 y.o.  FMW:[id number]    Code Status: Full Code   DOA: 05/08/2023   LOS: 4   Brief hospital course  Christine Reyes is a 67 y.o. 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   Er course: afebrile, blood pressure 130s to 150s over 70s to 110s.  Satting well on room air.  White count 6.9, hemoglobin 11.8, platelets 402, lactate 0.4.  Troponin within normal limits.  Urinalysis stable.  Creatinine 0.85.  Sodium 125.  Found to be orthostatic positive in the ER.  CT head with acute to subacute cortical infarcts in the left occipital lobe. MRI brain: multiple acute infarcts- bilateral occipital lobes, punctate right cerebellum and possible right lateral pons. CTA head and neck- no emergent large vessel occlusion.   LE doppler negative for DVT.  Echo is still pending.  Neurology is consulted.  PT/OT recommending CIR.   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 No DVT in LE doppler. No severe occlusive disease on head/neck CTA. Symptoms have improved and complains of visual field loss, confusion, and headache.  TTE LVEF 55 to 60%, grade 1 diastolic dysfunction. Cardiology consulted for Zio patch for 2 weeks Continue telemetry during hospital stay. 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 Not recommended to be on eliquis any more but she states that she takes it intermittently when she has knee  pain. Last time was about 2 weeks ago.  Continue Lovenox  for DVT prophylaxis  Hyponatremia- Na 125 on presentation- resolved  Gastroesophageal reflux disease without esophagitis Ppi    Hypertensive disorder- well controlled  Folic acid  level 6.4, at lower end, started folic acid  1 mg p.o. daily.  Follow-up with PCP to repeat folic acid  level after 3 to 6 months.  Abnormal thyroid function test Patient has been previously tested for hypo versus hypothyroid TSH 4.07 wnl but at higher end, free T4 level 0.56 low Recommended to repeat thyroid profile in 4 to 6 weeks and follow-up with endocrine as an outpatient for further management.  Neck pain, most likely musculoskeletal Continue Voltaren  gel, Tylenol  Robaxin  one-time dose ordered  Body mass index is 39.77 kg/m.  VTE ppx: enoxaparin  (LOVENOX ) injection 40 mg Start: 05/08/23 1600  Diet:     Diet   Diet regular Room service appropriate? Yes; Fluid consistency: Thin   Consultants: Neurology   Subjective 05/12/23    No significant overnight events, patient still complaining of some blurry vision in the right eye, no new neurological findings, no weakness in the upper extremities, has weakness in the bilateral lower extremities, overall poor historian.   Objective   Vitals:   05/11/23 2356 05/12/23 0356 05/12/23 0748 05/12/23 1207  BP: 109/60 101/64 125/68 112/83  Pulse: 79 70 62 97  Resp: 18 18 18 18   Temp: 97.8 F (36.6 C) (!) 97.4 F (36.3 C) 97.8 F (36.6 C) 98.1 F (36.7 C)  TempSrc: Oral Oral Oral Oral  SpO2: 100% 97% 100% 95%  Weight:  Height:       No intake or output data in the 24 hours ending 05/12/23 1617  Filed Weights   05/08/23 1535  Weight: 83.4 kg    Physical Exam:  General: awake, alert, NAD HEENT: atraumatic, clear conjunctiva, anicteric sclera, MMM, hearing grossly normal Respiratory: normal respiratory effort. Cardiovascular: quick capillary refill, normal S1/S2, RRR, no JVD,  murmurs Gastrointestinal: soft, NT, ND Nervous: A&O x3. normal speech.  Decreased strength in bilateral lower extremities, power 3/5, poor vision and loss of peripheral vision in the right eye, poor historian, difficult to examine. Extremities: moves all equally, no edema, normal tone Skin: No rashes Psychiatry: normal mood, congruent affect  Labs   I have personally reviewed the following labs and imaging studies CBC    Component Value Date/Time   WBC 5.3 05/11/2023 1120   RBC 3.41 (L) 05/11/2023 1120   HGB 11.0 (L) 05/11/2023 1120   HCT 32.9 (L) 05/11/2023 1120   PLT 319 05/11/2023 1120   MCV 96.5 05/11/2023 1120   MCH 32.3 05/11/2023 1120   MCHC 33.4 05/11/2023 1120   RDW 13.0 05/11/2023 1120   LYMPHSABS 2.5 02/04/2021 1332   MONOABS 0.7 02/04/2021 1332   EOSABS 0.1 02/04/2021 1332   BASOSABS 0.0 02/04/2021 1332      Latest Ref Rng & Units 05/11/2023   11:20 AM 05/09/2023    2:54 AM 05/08/2023   10:51 PM  BMP  Glucose 70 - 99 mg/dL 82     BUN 8 - 23 mg/dL 13     Creatinine 9.55 - 1.00 mg/dL 9.16     Sodium 864 - 854 mmol/L 141  137  138   Potassium 3.5 - 5.1 mmol/L 4.3     Chloride 98 - 111 mmol/L 111     CO2 22 - 32 mmol/L 21     Calcium  8.9 - 10.3 mg/dL 8.3       ECHOCARDIOGRAM COMPLETE BUBBLE STUDY Result Date: 05/12/2023    ECHOCARDIOGRAM REPORT   Patient Name:   Christine Reyes Date of Exam: 05/12/2023 Medical Rec #:  [id number]       Height:       57.0 in Accession #:    [id number]      Weight:       183.8 lb Date of Birth:  02-21-1956      BSA:          1.735 m Patient Age:    67 years        BP:           125/68 mmHg Patient Gender: F               HR:           62 bpm. Exam Location:  [facility] Procedure: 2D Echo, Cardiac Doppler, Color Doppler and Saline Contrast Bubble            Study Indications:     Stroke 434.91 / I63.9  History:         Patient has prior history of Echocardiogram examinations, most                  recent 05/11/2023.  Sonographer:     Christopher Furnace Referring Phys:  [id number] ELVAN SOR Diagnosing Phys: Redell Cave MD  Sonographer Comments: Image acquisition challenging due to respiratory motion. IMPRESSIONS  1. Left ventricular ejection fraction, by estimation, is 55 to 60%. The left ventricle has normal function. The left ventricle has  no regional wall motion abnormalities. Left ventricular diastolic parameters are consistent with Grade I diastolic dysfunction (impaired relaxation).  2. Right ventricular systolic function is normal. The right ventricular size is normal.  3. The mitral valve is normal in structure. Trivial mitral valve regurgitation.  4. The aortic valve is tricuspid. Aortic valve regurgitation is not visualized. Aortic valve sclerosis is present, with no evidence of aortic valve stenosis.  5. The inferior vena cava is normal in size with greater than 50% respiratory variability, suggesting right atrial pressure of 3 mmHg.  6. Agitated saline contrast bubble study was negative, with no evidence of any interatrial shunt. FINDINGS  Left Ventricle: Left ventricular ejection fraction, by estimation, is 55 to 60%. The left ventricle has normal function. The left ventricle has no regional wall motion abnormalities. The left ventricular internal cavity size was normal in size. There is  no left ventricular hypertrophy. Left ventricular diastolic parameters are consistent with Grade I diastolic dysfunction (impaired relaxation). Right Ventricle: The right ventricular size is normal. No increase in right ventricular wall thickness. Right ventricular systolic function is normal. Left Atrium: Left atrial size was normal in size. Right Atrium: Right atrial size was normal in size. Pericardium: There is no evidence of pericardial effusion. Mitral Valve: The mitral valve is normal in structure. Trivial mitral valve regurgitation. MV peak gradient, 4.6 mmHg. The mean mitral valve gradient is 2.0 mmHg. Tricuspid Valve: The tricuspid valve is normal  in structure. Tricuspid valve regurgitation is mild. Aortic Valve: The aortic valve is tricuspid. Aortic valve regurgitation is not visualized. Aortic valve sclerosis is present, with no evidence of aortic valve stenosis. Aortic valve mean gradient measures 2.0 mmHg. Aortic valve peak gradient measures 5.3  mmHg. Aortic valve area, by VTI measures 2.67 cm. Pulmonic Valve: The pulmonic valve was normal in structure. Pulmonic valve regurgitation is not visualized. Aorta: The aortic root is normal in size and structure. Venous: The inferior vena cava is normal in size with greater than 50% respiratory variability, suggesting right atrial pressure of 3 mmHg. IAS/Shunts: No atrial level shunt detected by color flow Doppler. Agitated saline contrast was given intravenously to evaluate for intracardiac shunting. Agitated saline contrast bubble study was negative, with no evidence of any interatrial shunt.  LEFT VENTRICLE PLAX 2D LVIDd:         4.20 cm   Diastology LVIDs:         2.50 cm   LV e' medial:    6.74 cm/s LV PW:         0.80 cm   LV E/e' medial:  12.3 LV IVS:        1.00 cm   LV e' lateral:   6.20 cm/s LVOT diam:     2.00 cm   LV E/e' lateral: 13.4 LV SV:         59 LV SV Index:   34 LVOT Area:     3.14 cm  RIGHT VENTRICLE RV Basal diam:  2.70 cm RV Mid diam:    2.10 cm RV S prime:     8.27 cm/s TAPSE (M-mode): 1.3 cm LEFT ATRIUM             Index        RIGHT ATRIUM          Index LA diam:        1.80 cm 1.04 cm/m   RA Area:     8.76 cm LA Vol (A2C):   23.2 ml 13.37 ml/m  RA Volume:   16.80 ml 9.68 ml/m LA Vol (A4C):   25.5 ml 14.70 ml/m LA Biplane Vol: 26.5 ml 15.27 ml/m  AORTIC VALVE AV Area (Vmax):    2.57 cm AV Area (Vmean):   2.98 cm AV Area (VTI):     2.67 cm AV Vmax:           115.00 cm/s AV Vmean:          69.200 cm/s AV VTI:            0.221 m AV Peak Grad:      5.3 mmHg AV Mean Grad:      2.0 mmHg LVOT Vmax:         94.20 cm/s LVOT Vmean:        65.600 cm/s LVOT VTI:          0.188 m LVOT/AV  VTI ratio: 0.85  AORTA Ao Root diam: 2.70 cm MITRAL VALVE                TRICUSPID VALVE MV Area (PHT): 4.15 cm     TR Peak grad:   13.8 mmHg MV Area VTI:   1.95 cm     TR Vmax:        186.00 cm/s MV Peak grad:  4.6 mmHg MV Mean grad:  2.0 mmHg     SHUNTS MV Vmax:       1.07 m/s     Systemic VTI:  0.19 m MV Vmean:      66.0 cm/s    Systemic Diam: 2.00 cm MV Decel Time: 183 msec MV E velocity: 82.80 cm/s MV A velocity: 106.00 cm/s MV E/A ratio:  0.78 Redell Cave MD Electronically signed by Redell Cave MD Signature Date/Time: 05/12/2023/2:11:55 PM    Final    ECHOCARDIOGRAM COMPLETE Result Date: 05/11/2023    ECHOCARDIOGRAM REPORT   Patient Name:   Christine Reyes Date of Exam: 05/11/2023 Medical Rec #:  [id number]       Height:       57.0 in Accession #:    [id number]      Weight:       183.8 lb Date of Birth:  1955-08-06      BSA:          1.735 m Patient Age:    67 years        BP:           131/71 mmHg Patient Gender: F               HR:           70 bpm. Exam Location:  [facility] Procedure: 2D Echo, Cardiac Doppler and Color Doppler Indications:     TIA  History:         Patient has no prior history of Echocardiogram examinations.                  CHF, TIA and Stroke; Risk Factors:Hypertension and Sleep Apnea.  Sonographer:     Naomie Reef Referring Phys:  [phone] ELSPETH PARAS NEWTON Diagnosing Phys: Cara JONETTA Lovelace MD  Sonographer Comments: Image acquisition challenging due to respiratory motion. IMPRESSIONS  1. Left ventricular ejection fraction, by estimation, is 55 to 60%. The left ventricle has normal function. The left ventricle has no regional wall motion abnormalities. Left ventricular diastolic parameters are consistent with Grade I diastolic dysfunction (impaired relaxation).  2. Right ventricular systolic function is normal. The right ventricular size is normal.  There is normal pulmonary artery systolic pressure.  3. The mitral valve is grossly normal. No evidence of mitral valve  regurgitation.  4. The aortic valve is normal in structure. Aortic valve regurgitation is not visualized. Aortic valve sclerosis is present, with no evidence of aortic valve stenosis. FINDINGS  Left Ventricle: Left ventricular ejection fraction, by estimation, is 55 to 60%. The left ventricle has normal function. The left ventricle has no regional wall motion abnormalities. The left ventricular internal cavity size was normal in size. There is  no left ventricular hypertrophy. Left ventricular diastolic parameters are consistent with Grade I diastolic dysfunction (impaired relaxation). Right Ventricle: The right ventricular size is normal. No increase in right ventricular wall thickness. Right ventricular systolic function is normal. There is normal pulmonary artery systolic pressure. The tricuspid regurgitant velocity is 2.57 m/s, and  with an assumed right atrial pressure of 3 mmHg, the estimated right ventricular systolic pressure is 29.4 mmHg. Left Atrium: Left atrial size was normal in size. Right Atrium: Right atrial size was normal in size. Pericardium: There is no evidence of pericardial effusion. Mitral Valve: The mitral valve is grossly normal. No evidence of mitral valve regurgitation. MV peak gradient, 5.3 mmHg. The mean mitral valve gradient is 2.0 mmHg. Tricuspid Valve: The tricuspid valve is normal in structure. Tricuspid valve regurgitation is trivial. Aortic Valve: The aortic valve is normal in structure. Aortic valve regurgitation is not visualized. Aortic valve sclerosis is present, with no evidence of aortic valve stenosis. Aortic valve mean gradient measures 6.0 mmHg. Aortic valve peak gradient measures 13.0 mmHg. Aortic valve area, by VTI measures 2.06 cm. Pulmonic Valve: The pulmonic valve was normal in structure. Pulmonic valve regurgitation is not visualized. Aorta: The ascending aorta was not well visualized. IAS/Shunts: No atrial level shunt detected by color flow Doppler.  LEFT VENTRICLE  PLAX 2D LVIDd:         4.20 cm     Diastology LVIDs:         2.70 cm     LV e' medial:    10.10 cm/s LV PW:         0.80 cm     LV E/e' medial:  8.3 LV IVS:        1.10 cm     LV e' lateral:   12.40 cm/s LVOT diam:     1.80 cm     LV E/e' lateral: 6.8 LV SV:         76 LV SV Index:   44 LVOT Area:     2.54 cm  LV Volumes (MOD) LV vol d, MOD A2C: 39.1 ml LV vol d, MOD A4C: 44.7 ml LV vol s, MOD A2C: 19.1 ml LV vol s, MOD A4C: 17.5 ml LV SV MOD A2C:     20.0 ml LV SV MOD A4C:     44.7 ml LV SV MOD BP:      23.5 ml RIGHT VENTRICLE RV Basal diam:  3.00 cm RV Mid diam:    2.50 cm RV S prime:     11.40 cm/s LEFT ATRIUM             Index        RIGHT ATRIUM           Index LA diam:        3.10 cm 1.79 cm/m   RA Area:     11.70 cm LA Vol (A2C):   53.0 ml 30.54 ml/m  RA Volume:  22.60 ml  13.02 ml/m LA Vol (A4C):   40.3 ml 23.22 ml/m LA Biplane Vol: 48.7 ml 28.07 ml/m  AORTIC VALVE                     PULMONIC VALVE AV Area (Vmax):    2.06 cm      PV Vmax:       1.00 m/s AV Area (Vmean):   1.87 cm      PV Peak grad:  4.0 mmHg AV Area (VTI):     2.06 cm AV Vmax:           180.00 cm/s AV Vmean:          115.000 cm/s AV VTI:            0.370 m AV Peak Grad:      13.0 mmHg AV Mean Grad:      6.0 mmHg LVOT Vmax:         146.00 cm/s LVOT Vmean:        84.300 cm/s LVOT VTI:          0.299 m LVOT/AV VTI ratio: 0.81  AORTA Ao Root diam: 3.00 cm MITRAL VALVE                TRICUSPID VALVE MV Area (PHT): 2.73 cm     TR Peak grad:   26.4 mmHg MV Area VTI:   2.46 cm     TR Vmax:        257.00 cm/s MV Peak grad:  5.3 mmHg MV Mean grad:  2.0 mmHg     SHUNTS MV Vmax:       1.15 m/s     Systemic VTI:  0.30 m MV Vmean:      58.3 cm/s    Systemic Diam: 1.80 cm MV Decel Time: 278 msec MV E velocity: 83.80 cm/s MV A velocity: 106.00 cm/s MV E/A ratio:  0.79 Dwayne D Callwood MD Electronically signed by Cara JONETTA Lovelace MD Signature Date/Time: 05/11/2023/6:24:25 PM    Final      Disposition Plan & Communication  Patient status:  Inpatient  Admitted From: Home Planned disposition location: Home vs CIR? Anticipated discharge date: 12/31 pending neurology clearance, long term cardiac monitoring placement, CIR admission  Family Communication: 12/31 no one was at bedside 12/30 d/w daughter at bedside  Author: Elvan Sor, MD [facility] 05/12/2023, 4:17 PM   Available by Epic secure chat 7AM-7PM. If 7PM-7AM, please contact night-coverage.  [facility] contact information found on [email or web address].

## 2023-05-12 NOTE — Progress Notes (Signed)
 Physical Therapy Treatment Patient Details Name: Christine Reyes MRN: [id number] DOB: 1955-06-12 Today's Date: 05/12/2023   History of Present Illness Pt is a 67 y.o. female presenting to hospital 05/08/23 with c/o weakness; episode of syncope (hit her head; mild HA since fall).  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  Pt admitted with CVA, hyponatremia, and hypertensive disorder.  PMH includes htn, obesity s/p gastric bypass, R TKA 2019, L knee sx.    PT Comments  Pt sitting on EOB (just finishing with OT) upon PT arrival.  Pt reporting R lateral/posterior neck pain (8/10--nurse notified of pt's request for pain meds).  During session pt min assist with transfers; CGA to min assist ambulating 100 feet with RW; and modified independent sit to semi-supine in bed.  Pt appearing more effortful/purposeful with ambulation today (decreased gait speed); R hip drop during L LE stance phase; intermittent assist to steady.  Pt min assist to steady in static standing (no UE support).  Pt reporting still having vision impairments (having difficulty reading words).  Will continue to focus on strengthening, balance, and progressive functional mobility during hospitalization.    If plan is discharge home, recommend the following: A little help with walking and/or transfers;A little help with bathing/dressing/bathroom;Assistance with cooking/housework;Assist for transportation;Help with stairs or ramp for entrance   Can travel by private vehicle        Equipment Recommendations  Rolling walker (2 wheels) (youth sized)    Recommendations for Other Services       Precautions / Restrictions Precautions Precautions: Fall Restrictions Weight Bearing Restrictions Per Provider Order: No     Mobility  Bed Mobility Overal bed mobility: Needs Assistance Bed Mobility: Sit to Supine       Sit to supine:  Modified independent (Device/Increase time), HOB elevated   General bed mobility comments: increased effort    Transfers Overall transfer level: Needs assistance Equipment used: Rolling walker (2 wheels) Transfers: Sit to/from Stand Sit to Stand: Min assist           General transfer comment: assist to steady standing with RW use    Ambulation/Gait Ambulation/Gait assistance: Contact guard assist, Min assist Gait Distance (Feet): 100 Feet Assistive device: Rolling walker (2 wheels) (youth sized) Gait Pattern/deviations: Decreased step length - right, Decreased step length - left Gait velocity: decreased/slower gait speed today     General Gait Details: mild decreased stance time L LE; assist to steady intermittently with fatigue; R hip drop during R LE advancement; 2 mild loss of balance requiring min assist to steady; more purposeful/effortful steps noted   Stairs             Wheelchair Mobility     Tilt Bed    Modified Rankin (Stroke Patients Only)       Balance Overall balance assessment: Needs assistance Sitting-balance support: No upper extremity supported, Feet supported Sitting balance-Leahy Scale: Good Sitting balance - Comments: steady sitting reaching within BOS   Standing balance support: Bilateral upper extremity supported, During functional activity, Reliant on assistive device for balance Standing balance-Leahy Scale: Poor Standing balance comment: CGA to min assist for ambulation with RW use; min assist static standing no UE support                            Cognition Arousal: Alert Behavior During Therapy: [facility] for tasks  assessed/performed Overall Cognitive Status: No family/caregiver present to determine baseline cognitive functioning Area of Impairment: Safety/judgement, Following commands                       Following Commands: Follows multi-step commands with increased time Safety/Judgement: Decreased  awareness of deficits   Problem Solving: Requires verbal cues, Slow processing          Exercises      General Comments  Pt agreeable to PT session.      Pertinent Vitals/Pain Pain Assessment Pain Assessment: 0-10 Pain Score: 8  Pain Location: R lateral/posterior neck pain Pain Descriptors / Indicators: Aching, Tender, Discomfort, Guarding Pain Intervention(s): Limited activity within patient's tolerance, Monitored during session, Premedicated before session, Repositioned, Patient requesting pain meds-RN notified HR 97 bpm at rest (increased up to 128 bpm with activity).    Home Living                          Prior Function            PT Goals (current goals can now be found in the care plan section) Acute Rehab PT Goals Patient Stated Goal: to improve walking PT Goal Formulation: With patient Time For Goal Achievement: 05/23/23 Potential to Achieve Goals: Good Progress towards PT goals: Progressing toward goals    Frequency    Min 1X/week      PT Plan      Co-evaluation              AM-PAC PT 6 Clicks Mobility   Outcome Measure  Help needed turning from your back to your side while in a flat bed without using bedrails?: None Help needed moving from lying on your back to sitting on the side of a flat bed without using bedrails?: None Help needed moving to and from a bed to a chair (including a wheelchair)?: A Little Help needed standing up from a chair using your arms (e.g., wheelchair or bedside chair)?: A Little Help needed to walk in hospital room?: A Little Help needed climbing 3-5 steps with a railing? : A Little 6 Click Score: 20    End of Session Equipment Utilized During Treatment: Gait belt Activity Tolerance: Patient limited by pain Patient left: in bed;with call bell/phone within reach;with bed alarm set Nurse Communication: Mobility status;Precautions;Patient requests pain meds PT Visit Diagnosis: Unsteadiness on feet  (R26.81);Other abnormalities of gait and mobility (R26.89);Muscle weakness (generalized) (M62.81);Hemiplegia and hemiparesis Hemiplegia - Right/Left: Left Hemiplegia - caused by: Cerebral infarction     Time: 1430-1458 PT Time Calculation (min) (ACUTE ONLY): 28 min  Charges:    $Gait Training: 8-22 mins $Therapeutic Activity: 8-22 mins PT General Charges $$ ACUTE PT VISIT: 1 Visit                    Damien Caulk, PT 05/12/23, 3:25 PM

## 2023-05-13 DIAGNOSIS — I639 Cerebral infarction, unspecified: Secondary | ICD-10-CM | POA: Diagnosis not present

## 2023-05-13 NOTE — Progress Notes (Signed)
 PROGRESS NOTE  Christine Reyes    DOB: [birth year], 68 y.o.  FMW:[id number]    Code Status: Full Code   DOA: 05/08/2023   LOS: 5   Brief hospital course  Christine Reyes is a 68 y.o. 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   Er course: afebrile, blood pressure 130s to 150s over 70s to 110s.  Satting well on room air.  White count 6.9, hemoglobin 11.8, platelets 402, lactate 0.4.  Troponin within normal limits.  Urinalysis stable.  Creatinine 0.85.  Sodium 125.  Found to be orthostatic positive in the ER.  CT head with acute to subacute cortical infarcts in the left occipital lobe. MRI brain: multiple acute infarcts- bilateral occipital lobes, punctate right cerebellum and possible right lateral pons. CTA head and neck- no emergent large vessel occlusion.   LE doppler negative for DVT.  Echo done as below Neurology was consulted.  PT/OT recommending CIR.   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 No DVT in LE doppler. No severe occlusive disease on head/neck CTA. Symptoms have improved and complains of visual field loss, confusion, and headache.  TTE LVEF 55 to 60%, grade 1 diastolic dysfunction. Cardiology consulted for Zio patch for 2 weeks Continue telemetry during hospital stay. 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 Not recommended to be on eliquis any more but she states that she takes it intermittently when she has knee pain.  Last time was about 2 weeks ago.  Continue Lovenox  for DVT prophylaxis  Hyponatremia- Na 125 on presentation- resolved  Gastroesophageal reflux disease without esophagitis Ppi    Hypertensive disorder- well controlled  Folic acid  level 6.4, at lower end, started folic acid  1 mg p.o. daily.  Follow-up with PCP to repeat folic acid  level after 3 to 6 months.  Abnormal thyroid function test Patient has been previously tested for hypo versus hypothyroid TSH 4.07 wnl but at higher end, free T4 level 0.56 low Recommended to repeat thyroid profile in 4 to 6 weeks and follow-up with endocrine as an outpatient for further management.  Neck pain, most likely musculoskeletal Continue Voltaren  gel, Tylenol  Robaxin  one-time dose ordered  Body mass index is 39.77 kg/m.  VTE ppx: enoxaparin  (LOVENOX ) injection 40 mg Start: 05/08/23 1600  Diet:     Diet   Diet regular Room service appropriate? Yes; Fluid consistency: Thin   Consultants: Neurology   Subjective 05/13/23    No significant overnight events, patient still has some blurry vision, she was wearing her eyeglasses.  Does not feel back to her baseline.  Denied any new neurological complaints, no weakness in the arms, legs are still weak.    Objective   Vitals:   05/13/23 0524 05/13/23 0746 05/13/23 0900 05/13/23 1205  BP: 108/72 104/68  (!) 115/59  Pulse: 69 (!) 58  (!) 58  Resp: 18 16  16   Temp: 98.9 F (37.2 C) 97.7 F (36.5 C)  97.8 F (36.6 C)  TempSrc: Oral     SpO2: 100% (!) 89% 92%  94%  Weight:      Height:       No intake or output data in the 24 hours ending 05/13/23 1315  Filed Weights   05/08/23 1535  Weight: 83.4 kg    Physical Exam:  General: awake, alert, NAD HEENT: atraumatic, clear conjunctiva, anicteric sclera, MMM, hearing grossly normal Respiratory: normal respiratory effort. Cardiovascular: quick capillary refill, normal S1/S2, RRR, no JVD, murmurs Gastrointestinal: soft, NT, ND Nervous: A&O  x3. normal speech.  Decreased strength in bilateral lower extremities, power 3/5, poor vision and loss of peripheral vision in the right eye, poor historian, difficult to examine. Extremities: moves all equally, no edema, normal tone Skin: No rashes Psychiatry: normal mood, congruent affect  Labs   I have personally reviewed the following labs and imaging studies CBC    Component Value Date/Time   WBC 5.3 05/11/2023 1120   RBC 3.41 (L) 05/11/2023 1120   HGB 11.0 (L) 05/11/2023 1120   HCT 32.9 (L) 05/11/2023 1120   PLT 319 05/11/2023 1120   MCV 96.5 05/11/2023 1120   MCH 32.3 05/11/2023 1120   MCHC 33.4 05/11/2023 1120   RDW 13.0 05/11/2023 1120   LYMPHSABS 2.5 02/04/2021 1332   MONOABS 0.7 02/04/2021 1332   EOSABS 0.1 02/04/2021 1332   BASOSABS 0.0 02/04/2021 1332      Latest Ref Rng & Units 05/11/2023   11:20 AM 05/09/2023    2:54 AM 05/08/2023   10:51 PM  BMP  Glucose 70 - 99 mg/dL 82     BUN 8 - 23 mg/dL 13     Creatinine 9.55 - 1.00 mg/dL 9.16     Sodium 864 - 854 mmol/L 141  137  138   Potassium 3.5 - 5.1 mmol/L 4.3     Chloride 98 - 111 mmol/L 111     CO2 22 - 32 mmol/L 21     Calcium  8.9 - 10.3 mg/dL 8.3       ECHOCARDIOGRAM COMPLETE BUBBLE STUDY Result Date: 05/12/2023    ECHOCARDIOGRAM REPORT   Patient Name:   Christine Reyes Date of Exam: 05/12/2023 Medical Rec #:  [id number]       Height:       57.0 in Accession #:    [id number]      Weight:       183.8 lb Date of Birth:  08/23/1955      BSA:          1.735 m Patient Age:    67 years        BP:           125/68 mmHg Patient Gender: F               HR:           62 bpm. Exam Location:  [facility] Procedure: 2D Echo, Cardiac Doppler, Color Doppler and Saline Contrast Bubble            Study Indications:     Stroke 434.91 / I63.9  History:         Patient has prior history of Echocardiogram examinations, most                  recent 05/11/2023.  Sonographer:     Christopher Furnace Referring Phys:  [id number] ELVAN SOR Diagnosing  Phys: Redell Cave MD  Sonographer Comments: Image acquisition challenging due to respiratory motion. IMPRESSIONS  1. Left ventricular ejection fraction, by estimation, is 55 to 60%. The left  ventricle has normal function. The left ventricle has no regional wall motion abnormalities. Left ventricular diastolic parameters are consistent with Grade I diastolic dysfunction (impaired relaxation).  2. Right ventricular systolic function is normal. The right ventricular size is normal.  3. The mitral valve is normal in structure. Trivial mitral valve regurgitation.  4. The aortic valve is tricuspid. Aortic valve regurgitation is not visualized. Aortic valve sclerosis is present, with no evidence of aortic valve stenosis.  5. The inferior vena cava is normal in size with greater than 50% respiratory variability, suggesting right atrial pressure of 3 mmHg.  6. Agitated saline contrast bubble study was negative, with no evidence of any interatrial shunt. FINDINGS  Left Ventricle: Left ventricular ejection fraction, by estimation, is 55 to 60%. The left ventricle has normal function. The left ventricle has no regional wall motion abnormalities. The left ventricular internal cavity size was normal in size. There is  no left ventricular hypertrophy. Left ventricular diastolic parameters are consistent with Grade I diastolic dysfunction (impaired relaxation). Right Ventricle: The right ventricular size is normal. No increase in right ventricular wall thickness. Right ventricular systolic function is normal. Left Atrium: Left atrial size was normal in size. Right Atrium: Right atrial size was normal in size. Pericardium: There is no evidence of pericardial effusion. Mitral Valve: The mitral valve is normal in structure. Trivial mitral valve regurgitation. MV peak gradient, 4.6 mmHg. The mean mitral valve gradient is 2.0 mmHg. Tricuspid Valve: The tricuspid valve is normal in structure. Tricuspid valve regurgitation is mild.  Aortic Valve: The aortic valve is tricuspid. Aortic valve regurgitation is not visualized. Aortic valve sclerosis is present, with no evidence of aortic valve stenosis. Aortic valve mean gradient measures 2.0 mmHg. Aortic valve peak gradient measures 5.3  mmHg. Aortic valve area, by VTI measures 2.67 cm. Pulmonic Valve: The pulmonic valve was normal in structure. Pulmonic valve regurgitation is not visualized. Aorta: The aortic root is normal in size and structure. Venous: The inferior vena cava is normal in size with greater than 50% respiratory variability, suggesting right atrial pressure of 3 mmHg. IAS/Shunts: No atrial level shunt detected by color flow Doppler. Agitated saline contrast was given intravenously to evaluate for intracardiac shunting. Agitated saline contrast bubble study was negative, with no evidence of any interatrial shunt.  LEFT VENTRICLE PLAX 2D LVIDd:         4.20 cm   Diastology LVIDs:         2.50 cm   LV e' medial:    6.74 cm/s LV PW:         0.80 cm   LV E/e' medial:  12.3 LV IVS:        1.00 cm   LV e' lateral:   6.20 cm/s LVOT diam:     2.00 cm   LV E/e' lateral: 13.4 LV SV:         59 LV SV Index:   34 LVOT Area:     3.14 cm  RIGHT VENTRICLE RV Basal diam:  2.70 cm RV Mid diam:    2.10 cm RV S prime:     8.27 cm/s TAPSE (M-mode): 1.3 cm LEFT ATRIUM             Index        RIGHT ATRIUM          Index LA diam:        1.80 cm 1.04 cm/m   RA Area:     8.76 cm LA  Vol [facility]):   23.2 ml 13.37 ml/m  RA Volume:   16.80 ml 9.68 ml/m LA Vol (A4C):   25.5 ml 14.70 ml/m LA Biplane Vol: 26.5 ml 15.27 ml/m  AORTIC VALVE AV Area (Vmax):    2.57 cm AV Area (Vmean):   2.98 cm AV Area (VTI):     2.67 cm AV Vmax:           115.00 cm/s AV Vmean:          69.200 cm/s AV VTI:            0.221 m AV Peak Grad:      5.3 mmHg AV Mean Grad:      2.0 mmHg LVOT Vmax:         94.20 cm/s LVOT Vmean:        65.600 cm/s LVOT VTI:          0.188 m LVOT/AV VTI ratio: 0.85  AORTA Ao Root diam: 2.70 cm MITRAL  VALVE                TRICUSPID VALVE MV Area (PHT): 4.15 cm     TR Peak grad:   13.8 mmHg MV Area VTI:   1.95 cm     TR Vmax:        186.00 cm/s MV Peak grad:  4.6 mmHg MV Mean grad:  2.0 mmHg     SHUNTS MV Vmax:       1.07 m/s     Systemic VTI:  0.19 m MV Vmean:      66.0 cm/s    Systemic Diam: 2.00 cm MV Decel Time: 183 msec MV E velocity: 82.80 cm/s MV A velocity: 106.00 cm/s MV E/A ratio:  0.78 Redell Cave MD Electronically signed by Redell Cave MD Signature Date/Time: 05/12/2023/2:11:55 PM    Final    ECHOCARDIOGRAM COMPLETE Result Date: 05/11/2023    ECHOCARDIOGRAM REPORT   Patient Name:   Christine Reyes Date of Exam: 05/11/2023 Medical Rec #:  [id number]       Height:       57.0 in Accession #:    [id number]      Weight:       183.8 lb Date of Birth:  07-06-1955      BSA:          1.735 m Patient Age:    67 years        BP:           131/71 mmHg Patient Gender: F               HR:           70 bpm. Exam Location:  [facility] Procedure: 2D Echo, Cardiac Doppler and Color Doppler Indications:     TIA  History:         Patient has no prior history of Echocardiogram examinations.                  CHF, TIA and Stroke; Risk Factors:Hypertension and Sleep Apnea.  Sonographer:     Naomie Reef Referring Phys:  [id number] ELSPETH PARAS NEWTON Diagnosing Phys: Cara JONETTA Lovelace MD  Sonographer Comments: Image acquisition challenging due to respiratory motion. IMPRESSIONS  1. Left ventricular ejection fraction, by estimation, is 55 to 60%. The left ventricle has normal function. The left ventricle has no regional wall motion abnormalities. Left ventricular diastolic parameters are consistent with Grade I diastolic dysfunction (impaired relaxation).  2. Right ventricular systolic  function is normal. The right ventricular size is normal. There is normal pulmonary artery systolic pressure.  3. The mitral valve is grossly normal. No evidence of mitral valve regurgitation.  4. The aortic valve is normal in structure.  Aortic valve regurgitation is not visualized. Aortic valve sclerosis is present, with no evidence of aortic valve stenosis. FINDINGS  Left Ventricle: Left ventricular ejection fraction, by estimation, is 55 to 60%. The left ventricle has normal function. The left ventricle has no regional wall motion abnormalities. The left ventricular internal cavity size was normal in size. There is  no left ventricular hypertrophy. Left ventricular diastolic parameters are consistent with Grade I diastolic dysfunction (impaired relaxation). Right Ventricle: The right ventricular size is normal. No increase in right ventricular wall thickness. Right ventricular systolic function is normal. There is normal pulmonary artery systolic pressure. The tricuspid regurgitant velocity is 2.57 m/s, and  with an assumed right atrial pressure of 3 mmHg, the estimated right ventricular systolic pressure is 29.4 mmHg. Left Atrium: Left atrial size was normal in size. Right Atrium: Right atrial size was normal in size. Pericardium: There is no evidence of pericardial effusion. Mitral Valve: The mitral valve is grossly normal. No evidence of mitral valve regurgitation. MV peak gradient, 5.3 mmHg. The mean mitral valve gradient is 2.0 mmHg. Tricuspid Valve: The tricuspid valve is normal in structure. Tricuspid valve regurgitation is trivial. Aortic Valve: The aortic valve is normal in structure. Aortic valve regurgitation is not visualized. Aortic valve sclerosis is present, with no evidence of aortic valve stenosis. Aortic valve mean gradient measures 6.0 mmHg. Aortic valve peak gradient measures 13.0 mmHg. Aortic valve area, by VTI measures 2.06 cm. Pulmonic Valve: The pulmonic valve was normal in structure. Pulmonic valve regurgitation is not visualized. Aorta: The ascending aorta was not well visualized. IAS/Shunts: No atrial level shunt detected by color flow Doppler.  LEFT VENTRICLE PLAX 2D LVIDd:         4.20 cm     Diastology LVIDs:          2.70 cm     LV e' medial:    10.10 cm/s LV PW:         0.80 cm     LV E/e' medial:  8.3 LV IVS:        1.10 cm     LV e' lateral:   12.40 cm/s LVOT diam:     1.80 cm     LV E/e' lateral: 6.8 LV SV:         76 LV SV Index:   44 LVOT Area:     2.54 cm  LV Volumes (MOD) LV vol d, MOD A2C: 39.1 ml LV vol d, MOD A4C: 44.7 ml LV vol s, MOD A2C: 19.1 ml LV vol s, MOD A4C: 17.5 ml LV SV MOD A2C:     20.0 ml LV SV MOD A4C:     44.7 ml LV SV MOD BP:      23.5 ml RIGHT VENTRICLE RV Basal diam:  3.00 cm RV Mid diam:    2.50 cm RV S prime:     11.40 cm/s LEFT ATRIUM             Index        RIGHT ATRIUM           Index LA diam:        3.10 cm 1.79 cm/m   RA Area:     11.70 cm LA Vol (A2C):  53.0 ml 30.54 ml/m  RA Volume:   22.60 ml  13.02 ml/m LA Vol (A4C):   40.3 ml 23.22 ml/m LA Biplane Vol: 48.7 ml 28.07 ml/m  AORTIC VALVE                     PULMONIC VALVE AV Area (Vmax):    2.06 cm      PV Vmax:       1.00 m/s AV Area (Vmean):   1.87 cm      PV Peak grad:  4.0 mmHg AV Area (VTI):     2.06 cm AV Vmax:           180.00 cm/s AV Vmean:          115.000 cm/s AV VTI:            0.370 m AV Peak Grad:      13.0 mmHg AV Mean Grad:      6.0 mmHg LVOT Vmax:         146.00 cm/s LVOT Vmean:        84.300 cm/s LVOT VTI:          0.299 m LVOT/AV VTI ratio: 0.81  AORTA Ao Root diam: 3.00 cm MITRAL VALVE                TRICUSPID VALVE MV Area (PHT): 2.73 cm     TR Peak grad:   26.4 mmHg MV Area VTI:   2.46 cm     TR Vmax:        257.00 cm/s MV Peak grad:  5.3 mmHg MV Mean grad:  2.0 mmHg     SHUNTS MV Vmax:       1.15 m/s     Systemic VTI:  0.30 m MV Vmean:      58.3 cm/s    Systemic Diam: 1.80 cm MV Decel Time: 278 msec MV E velocity: 83.80 cm/s MV A velocity: 106.00 cm/s MV E/A ratio:  0.79 Dwayne D Callwood MD Electronically signed by Cara JONETTA Lovelace MD Signature Date/Time: 05/11/2023/6:24:25 PM    Final      Disposition Plan & Communication  Patient status: Inpatient  Admitted From: Home Planned disposition  location: Home vs CIR? Anticipated discharge date: Insurance approved for CIR admission, bed will be available tomorrow a.m.  Family Communication: 1/1 no one was at bedside 12/30 d/w daughter at bedside  Author: Elvan Sor, MD [facility] 05/13/2023, 1:15 PM   Available by Epic secure chat 7AM-7PM. If 7PM-7AM, please contact night-coverage.  [facility] contact information found on [email or web address].

## 2023-05-13 NOTE — Plan of Care (Signed)

## 2023-05-13 NOTE — Plan of Care (Signed)
  Problem: Education: Goal: Knowledge of disease or condition will improve Outcome: Progressing Goal: Knowledge of secondary prevention will improve (MUST DOCUMENT ALL) Outcome: Progressing Goal: Knowledge of patient specific risk factors will improve Alonso N/A or DELETE if not current risk factor) Outcome: Progressing   Problem: Ischemic Stroke/TIA Tissue Perfusion: Goal: Complications of ischemic stroke/TIA will be minimized Outcome: Progressing   Problem: Coping: Goal: Will verbalize positive feelings about self Outcome: Progressing Goal: Will identify appropriate support needs Outcome: Progressing   Problem: Health Behavior/Discharge Planning: Goal: Ability to manage health-related needs will improve Outcome: Progressing Goal: Goals will be collaboratively established with patient/family Outcome: Progressing   Problem: Self-Care: Goal: Ability to participate in self-care as condition permits will improve Outcome: Progressing Goal: Verbalization of feelings and concerns over difficulty with self-care will improve Outcome: Progressing Goal: Ability to communicate needs accurately will improve Outcome: Progressing   Problem: Nutrition: Goal: Risk of aspiration will decrease Outcome: Progressing Goal: Dietary intake will improve Outcome: Progressing   Problem: Education: Goal: Knowledge of General Education information will improve Description: Including pain rating scale, medication(s)/side effects and non-pharmacologic comfort measures Outcome: Progressing   Problem: Health Behavior/Discharge Planning: Goal: Ability to manage health-related needs will improve Outcome: Progressing   Problem: Clinical Measurements: Goal: Ability to maintain clinical measurements within normal limits will improve Outcome: Progressing Goal: Will remain free from infection Outcome: Progressing Goal: Diagnostic test results will improve Outcome: Progressing Goal: Respiratory  complications will improve Outcome: Progressing Goal: Cardiovascular complication will be avoided Outcome: Progressing   Problem: Activity: Goal: Risk for activity intolerance will decrease Outcome: Progressing   Problem: Nutrition: Goal: Adequate nutrition will be maintained Outcome: Progressing   Problem: Coping: Goal: Level of anxiety will decrease Outcome: Progressing

## 2023-05-14 ENCOUNTER — Encounter (HOSPITAL_COMMUNITY): Payer: Self-pay | Admitting: Physical Medicine & Rehabilitation

## 2023-05-14 ENCOUNTER — Other Ambulatory Visit: Payer: Self-pay

## 2023-05-14 ENCOUNTER — Inpatient Hospital Stay (HOSPITAL_COMMUNITY)
Admission: AD | Admit: 2023-05-14 | Discharge: 2023-05-26 | DRG: 057 | Disposition: A | Discharge status: Home / Self Care | Payer: 59 | Source: Other Acute Inpatient Hospital | Attending: Physical Medicine & Rehabilitation | Admitting: Physical Medicine & Rehabilitation

## 2023-05-14 DIAGNOSIS — D508 Other iron deficiency anemias: Secondary | ICD-10-CM | POA: Diagnosis not present

## 2023-05-14 DIAGNOSIS — E66812 Obesity, class 2: Secondary | ICD-10-CM | POA: Diagnosis present

## 2023-05-14 DIAGNOSIS — I69354 Hemiplegia and hemiparesis following cerebral infarction affecting left non-dominant side: Secondary | ICD-10-CM | POA: Diagnosis present

## 2023-05-14 DIAGNOSIS — Z87891 Personal history of nicotine dependence: Secondary | ICD-10-CM | POA: Diagnosis not present

## 2023-05-14 DIAGNOSIS — E669 Obesity, unspecified: Secondary | ICD-10-CM | POA: Diagnosis present

## 2023-05-14 DIAGNOSIS — Z9884 Bariatric surgery status: Secondary | ICD-10-CM

## 2023-05-14 DIAGNOSIS — K219 Gastro-esophageal reflux disease without esophagitis: Secondary | ICD-10-CM | POA: Diagnosis present

## 2023-05-14 DIAGNOSIS — Z96651 Presence of right artificial knee joint: Secondary | ICD-10-CM | POA: Diagnosis present

## 2023-05-14 DIAGNOSIS — E871 Hypo-osmolality and hyponatremia: Secondary | ICD-10-CM | POA: Diagnosis present

## 2023-05-14 DIAGNOSIS — G894 Chronic pain syndrome: Secondary | ICD-10-CM | POA: Diagnosis present

## 2023-05-14 DIAGNOSIS — Z79899 Other long term (current) drug therapy: Secondary | ICD-10-CM | POA: Diagnosis not present

## 2023-05-14 DIAGNOSIS — I6350 Cerebral infarction due to unspecified occlusion or stenosis of unspecified cerebral artery: Secondary | ICD-10-CM | POA: Diagnosis not present

## 2023-05-14 DIAGNOSIS — Z8249 Family history of ischemic heart disease and other diseases of the circulatory system: Secondary | ICD-10-CM

## 2023-05-14 DIAGNOSIS — Z8261 Family history of arthritis: Secondary | ICD-10-CM

## 2023-05-14 DIAGNOSIS — Z7982 Long term (current) use of aspirin: Secondary | ICD-10-CM | POA: Diagnosis not present

## 2023-05-14 DIAGNOSIS — E785 Hyperlipidemia, unspecified: Secondary | ICD-10-CM | POA: Diagnosis present

## 2023-05-14 DIAGNOSIS — I639 Cerebral infarction, unspecified: Secondary | ICD-10-CM | POA: Diagnosis not present

## 2023-05-14 DIAGNOSIS — Z79891 Long term (current) use of opiate analgesic: Secondary | ICD-10-CM | POA: Diagnosis not present

## 2023-05-14 DIAGNOSIS — Z90710 Acquired absence of both cervix and uterus: Secondary | ICD-10-CM | POA: Diagnosis not present

## 2023-05-14 DIAGNOSIS — I10 Essential (primary) hypertension: Secondary | ICD-10-CM | POA: Diagnosis present

## 2023-05-14 DIAGNOSIS — M25811 Other specified joint disorders, right shoulder: Secondary | ICD-10-CM | POA: Diagnosis not present

## 2023-05-14 DIAGNOSIS — Z6839 Body mass index (BMI) 39.0-39.9, adult: Secondary | ICD-10-CM | POA: Diagnosis not present

## 2023-05-14 DIAGNOSIS — I69319 Unspecified symptoms and signs involving cognitive functions following cerebral infarction: Secondary | ICD-10-CM

## 2023-05-14 MED ORDER — ACETAMINOPHEN 325 MG PO TABS: 650.0000 mg | ORAL_TABLET | Freq: Four times a day (QID) | ORAL | Status: AC | PRN

## 2023-05-14 MED ORDER — FOLIC ACID 1 MG PO TABS: 1.0000 mg | ORAL_TABLET | Freq: Every day | ORAL | Status: DC

## 2023-05-14 MED ORDER — CLOPIDOGREL BISULFATE 75 MG PO TABS
75.0000 mg | ORAL_TABLET | Freq: Every day | ORAL | Status: DC
  Administered 2023-05-15 – 2023-05-26 (×12): 75 mg via ORAL
  Filled 2023-05-14 (×12): qty 1

## 2023-05-14 MED ORDER — PANTOPRAZOLE SODIUM 40 MG PO TBEC
40.0000 mg | DELAYED_RELEASE_TABLET | Freq: Every day | ORAL | Status: DC
  Administered 2023-05-15 – 2023-05-26 (×12): 40 mg via ORAL
  Filled 2023-05-14 (×12): qty 1

## 2023-05-14 MED ORDER — ATORVASTATIN CALCIUM 40 MG PO TABS: 40.0000 mg | ORAL_TABLET | Freq: Every day | ORAL | Status: DC

## 2023-05-14 MED ORDER — GABAPENTIN 300 MG PO CAPS
300.0000 mg | ORAL_CAPSULE | Freq: Two times a day (BID) | ORAL | Status: DC
  Administered 2023-05-14 – 2023-05-26 (×24): 300 mg via ORAL
  Filled 2023-05-14 (×24): qty 1

## 2023-05-14 MED ORDER — ENOXAPARIN SODIUM 40 MG/0.4ML IJ SOSY
40.0000 mg | PREFILLED_SYRINGE | INTRAMUSCULAR | Status: DC
  Administered 2023-05-14 – 2023-05-21 (×8): 40 mg via SUBCUTANEOUS
  Filled 2023-05-14 (×8): qty 0.4

## 2023-05-14 MED ORDER — ENOXAPARIN SODIUM 40 MG/0.4ML IJ SOSY: 40.0000 mg | PREFILLED_SYRINGE | INTRAMUSCULAR | Status: DC

## 2023-05-14 MED ORDER — OXYCODONE HCL 5 MG PO TABS
5.0000 mg | ORAL_TABLET | Freq: Four times a day (QID) | ORAL | Status: DC | PRN
  Administered 2023-05-14 – 2023-05-26 (×34): 5 mg via ORAL
  Filled 2023-05-14 (×34): qty 1

## 2023-05-14 MED ORDER — TRAZODONE HCL 100 MG PO TABS: 50.0000 mg | ORAL_TABLET | Freq: Every evening | ORAL | Status: DC | PRN

## 2023-05-14 MED ORDER — ASPIRIN 81 MG PO TBEC: 81.0000 mg | DELAYED_RELEASE_TABLET | Freq: Every day | ORAL | Status: AC

## 2023-05-14 MED ORDER — METHOCARBAMOL 500 MG PO TABS: 500.0000 mg | ORAL_TABLET | Freq: Three times a day (TID) | ORAL | Status: DC | PRN

## 2023-05-14 MED ORDER — BACLOFEN 5 MG HALF TABLET
5.0000 mg | ORAL_TABLET | Freq: Three times a day (TID) | ORAL | Status: DC | PRN
  Administered 2023-05-24 – 2023-05-25 (×2): 5 mg via ORAL
  Filled 2023-05-14 (×2): qty 1

## 2023-05-14 MED ORDER — DICLOFENAC SODIUM 1 % EX GEL: 4.0000 g | Freq: Three times a day (TID) | CUTANEOUS | Status: DC | PRN

## 2023-05-14 MED ORDER — ALUM & MAG HYDROXIDE-SIMETH 200-200-20 MG/5ML PO SUSP: 30.0000 mL | ORAL | Status: DC | PRN

## 2023-05-14 MED ORDER — ATORVASTATIN CALCIUM 40 MG PO TABS
40.0000 mg | ORAL_TABLET | Freq: Every day | ORAL | Status: DC
  Administered 2023-05-15 – 2023-05-26 (×12): 40 mg via ORAL
  Filled 2023-05-14 (×12): qty 1

## 2023-05-14 MED ORDER — TRAZODONE HCL 50 MG PO TABS
100.0000 mg | ORAL_TABLET | Freq: Every day | ORAL | Status: DC
  Administered 2023-05-14 – 2023-05-25 (×12): 100 mg via ORAL
  Filled 2023-05-14 (×12): qty 2

## 2023-05-14 MED ORDER — FOLIC ACID 1 MG PO TABS
1.0000 mg | ORAL_TABLET | Freq: Every day | ORAL | Status: DC
  Administered 2023-05-15 – 2023-05-26 (×12): 1 mg via ORAL
  Filled 2023-05-14 (×12): qty 1

## 2023-05-14 MED ORDER — ASPIRIN 81 MG PO TBEC
81.0000 mg | DELAYED_RELEASE_TABLET | Freq: Every day | ORAL | Status: DC
  Administered 2023-05-15 – 2023-05-26 (×12): 81 mg via ORAL
  Filled 2023-05-14 (×12): qty 1

## 2023-05-14 MED ORDER — ACETAMINOPHEN 325 MG PO TABS: 650.0000 mg | ORAL_TABLET | Freq: Four times a day (QID) | ORAL | Status: DC | PRN

## 2023-05-14 MED ORDER — CLOPIDOGREL BISULFATE 75 MG PO TABS: 75.0000 mg | ORAL_TABLET | Freq: Every day | ORAL | Status: DC

## 2023-05-14 NOTE — Progress Notes (Signed)
 Pt seen for modified session to continue to progress and focus on gait training and balance with RW. Pt slightly unsteady while ambulating with RW requiring MinA at times. Continues to c/o B upper trap tightness and pain. Will continue to progress, awaiting transfer to CIR 05/14/23   05/13/23 1725  PT Visit Information  Assistance Needed +1  History of Present Illness Pt is a 68 y.o. female presenting to hospital 05/08/23 with c/o weakness; episode of syncope (hit her head; mild HA since fall).  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  Pt admitted with CVA, hyponatremia, and hypertensive disorder.  PMH includes htn, obesity s/p gastric bypass, R TKA 2019, L knee sx.  Subjective Data  Subjective c/o neck tightness  Patient Stated Goal to improve walking  Precautions  Precautions Fall  Restrictions  Weight Bearing Restrictions Per Provider Order No  Pain Assessment  Pain Assessment 0-10  Pain Score 5  Pain Location R lateral/posterior neck pain  Pain Descriptors / Indicators Aching;Tender;Discomfort;Guarding  Pain Intervention(s) Monitored during session  Cognition  Arousal Alert  Behavior During Therapy [facility] for tasks assessed/performed  Overall Cognitive Status No family/caregiver present to determine baseline cognitive functioning  Area of Impairment Safety/judgement;Following commands  Following Commands Follows multi-step commands with increased time  Safety/Judgement Decreased awareness of deficits  Awareness Emergent  Problem Solving Requires verbal cues;Slow processing  General Comments Pleasant, cooperative  Bed Mobility  Overal bed mobility Needs Assistance  Bed Mobility Sit to Supine  Supine to sit Supervision;Used rails;HOB elevated  Sit to supine Modified independent (Device/Increase time);HOB elevated  General bed mobility comments increased time and effort to reach EOB  today-possibly due to pain  Transfers  Overall transfer level Needs assistance  Equipment used Rolling walker (2 wheels)  Transfers Sit to/from Stand  Sit to Stand Min assist  General transfer comment assist to steady standing with RW use  Ambulation/Gait  Ambulation/Gait assistance Contact guard assist;Min assist  Gait Distance (Feet) 100 Feet  Assistive device Rolling walker (2 wheels) (YOUTH SIZE)  Gait Pattern/deviations Decreased step length - right;Decreased step length - left  General Gait Details mild decreased stance time L LE; assist to steady intermittently with fatigue; R hip drop during R LE advancement; 2 mild loss of balance requiring min assist to steady; more purposeful/effortful steps noted  Gait velocity decreased/slower gait speed today  Balance  Overall balance assessment Needs assistance  Sitting-balance support No upper extremity supported;Feet supported  Sitting balance-Leahy Scale Good  Sitting balance - Comments steady sitting reaching within BOS  Standing balance support Bilateral upper extremity supported;During functional activity;Reliant on assistive device for balance  Standing balance-Leahy Scale Poor  Standing balance comment CGA to min assist for ambulation with RW use; min assist static standing no UE support  General Comments  General comments (skin integrity, edema, etc.) B Upper traps tight and sensative to touch, placed warming pad at pain site post session  PT - End of Session  Equipment Utilized During Treatment Gait belt  Activity Tolerance Patient limited by pain  Patient left in bed;with call bell/phone within reach;with bed alarm set  Nurse Communication Mobility status;Precautions;Patient requests pain meds   PT - Assessment/Plan  PT Visit Diagnosis Unsteadiness on feet (R26.81);Other abnormalities of gait and mobility (R26.89);Muscle weakness (generalized) (M62.81);Hemiplegia and hemiparesis  Hemiplegia - Right/Left Left  Hemiplegia -  dominant/non-dominant Dominant  Hemiplegia - caused by Cerebral infarction  PT Frequency (ACUTE ONLY) Min 1X/week  Follow Up Recommendations Acute inpatient rehab (3hours/day)  Patient can return home with the following A little help with walking and/or transfers;A little help with bathing/dressing/bathroom;Assistance with cooking/housework;Assist for transportation;Help with stairs or ramp for entrance  PT equipment Rolling walker (2 wheels);Other (comment) (Youth Size)  AM-PAC PT 6 Clicks Mobility Outcome Measure (Version 2)  Help needed turning from your back to your side while in a flat bed without using bedrails? 4  Help needed moving from lying on your back to sitting on the side of a flat bed without using bedrails? 4  Help needed moving to and from a bed to a chair (including a wheelchair)? 3  Help needed standing up from a chair using your arms (e.g., wheelchair or bedside chair)? 3  Help needed to walk in hospital room? 3  Help needed climbing 3-5 steps with a railing?  3  6 Click Score 20  Consider Recommendation of Discharge To: Home with no services  Progressive Mobility  What is the highest level of mobility based on the progressive mobility assessment? Level 5 (Walks with assist in room/hall) - Balance while stepping forward/back and can walk in room with assist - Complete  Activity Ambulated with assistance in hallway  PT Goal Progression  Progress towards PT goals Progressing toward goals  PT Time Calculation  PT Start Time (ACUTE ONLY) 1701  PT Stop Time (ACUTE ONLY) 1718  PT Time Calculation (min) (ACUTE ONLY) 17 min  PT General Charges  $$ ACUTE PT VISIT 1 Visit  PT Treatments  $Gait Training 8-22 mins  Darice Bohr, PTA

## 2023-05-14 NOTE — Progress Notes (Signed)
 Inpatient Rehab Admissions Coordinator:    I have insurance approval and a bed available for pt to admit to CIR today. Dr. Von in agreement, [facility] aware.  I spoke to pt via phone and she is in agreement to proceed with admit today.  I will call [facility] for pickup as soon as room assigned.  RN can call report at [phone].   Reche Lowers, PT, DPT Admissions Coordinator [phone] 05/14/23  10:14 AM

## 2023-05-14 NOTE — Progress Notes (Addendum)
 Inpatient Rehabilitation Admission Medication Review by a Pharmacist  A complete drug regimen review was completed for this patient to identify any potential clinically significant medication issues.  High Risk Drug Classes Is patient taking? Indication by Medication  Antipsychotic No   Anticoagulant Yes Enoxaparin  - VTE ppx  Antibiotic No   Opioid Yes Oxycodone - pain  Antiplatelet Yes Aspirin , plavix  for 3 weeks (05/08/23 through 05/28/23), followed by Aspirin  alone - acute embolic CVA  Hypoglycemics/insulin No   Vasoactive Medication No   Chemotherapy No   Other Yes Acetaminophen , diclofenac  topical gel-pain Atorvastatin - HLD Baclofen - spasms Folic acid - supplement Gabapentin - pain Pantoprazole -GERD Trazodone - for sleep     Type of Medication Issue Identified Description of Issue Recommendation(s)  Drug Interaction(s) (clinically significant)     Duplicate Therapy     Allergy     No Medication Administration End Date     Incorrect Dose     Additional Drug Therapy Needed     Significant med changes from prior encounter (inform family/care partners about these prior to discharge). PTA meds  Narcan nasal spray, Olmesartain-hydrochlorothiazide  and Oxycodone  were discontinued on discharge from [facility] acute hospitalization.   ___________________________  PTA meds: Arthritis strength BC powder x ONCE, Dicyclomine , and Ibuprofen  reported as not taking prior to [facility] admission 05/08/23 and these were discontinued on discharge orders from [facility] 05/14/23.  Restart PTA meds when and if necessary during CIR admission or at time of discharge, if warranted    _____________________________  Other  Aspirin , plavix  for 3 weeks (05/08/23 through 05/28/23), followed by Aspirin  alone - acute embolic CVA Enter  stop date for Plavix  to continue through 05/28/23 then stop.    Clinically significant medication issues were identified that warrant physician communication and completion of  prescribed/recommended actions by midnight of the next day:  No  Name of provider notified for urgent issues identified:   Provider Method of Notification:     Pharmacist comments:   Enter stop date for Plavix  to continue through 05/28/23 then stop.  Time spent performing this drug regimen review (minutes):     Levorn Gaskins, RPh Clinical Pharmacist 05/14/2023 2:16 PM

## 2023-05-14 NOTE — TOC Transition Note (Signed)
 Transition of Care [facility]) - Discharge Note   Patient Details  Name: Christine Reyes MRN: [id number] Date of Birth: 24-Jan-1956  [facility] of [facility] CM/SW Contact:  Brice Potteiger A Jaisa Defino, RN Phone Number: 05/14/2023, 10:53 AM   Clinical Narrative:    Chart reviewed.  Informed by CIR admissions team that patient approval for Inpatient Rehab.    Per CIR team [facility] will transport patient CIR today.     Final next level of care: IP Rehab Facility Barriers to Discharge: No Barriers Identified   Patient Goals and CMS Choice   CMS Medicare.gov Compare Post Acute Care list provided to:: Patient Choice offered to / list presented to : Patient      Discharge Placement                Patient to be transferred to facility by: [facility]   Patient and family notified of of transfer: 05/14/23  Discharge Plan and Services Additional resources added to the After Visit Summary for                                       Social Drivers of Health (SDOH) Interventions SDOH Screenings   Food Insecurity: No Food Insecurity (05/08/2023)  Housing: Low Risk  (05/08/2023)  Transportation Needs: No Transportation Needs (05/08/2023)  Utilities: Not At Risk (05/08/2023)  Financial Resource Strain: Low Risk  (01/07/2023)   Received from [facility]  Social Connections: Patient Declined (05/13/2023)  Tobacco Use: Medium Risk (05/08/2023)     Readmission Risk Interventions     No data to display

## 2023-05-14 NOTE — Progress Notes (Signed)
 Emeline Joesph BROCKS, DO  Physician Physical Medicine and Rehabilitation   PMR Pre-admission    Signed   Date of Service: 05/14/2023 10:18 AM  Related encounter: ED to Hosp-Admission (Discharged) from 05/08/2023 in [facility] 1C MEDICAL TELEMETRY   Signed     Expand All Collapse All  PMR Admission Coordinator Pre-Admission Assessment   Patient: Christine Reyes is an 68 y.o., female MRN: [id number] DOB: [birth year] Height: 4' 9 (144.8 cm) Weight: 83.4 kg   Insurance Information HMO: yes    PPO:      PCP:      IPA:      80/20:      OTHER:  PRIMARY: U[employer] Medicare Dual Complete      Policy#: [id number]      Subscriber: patient CM Name: Eleanor      Phone#: [phone] option #3     Fax#: [phone] Pre-Cert#: [id number]   approved 1/2 until 05/20/23  Employer:  Benefits:  Phone #: [email or web address]     Name:  Eff. Date: 05/13/23    Deduct: $240 ($0 met)      Out of Pocket Max: $9350 ($0 met)      Life Max: NA CIR: $1,565 co-pay/admission      SNF: 20 full days Outpatient: 80% coverage     Co-Pay: 20% co-insurance Home Health: 100% coverage      Co-Pay:  DME: 80% coverage     Co-Pay: 20% co-insurance Providers: in-network  SECONDARY: Medicaid of [place]      Policy#: [id number]     Phone#: [phone]   Financial Counselor:       Phone#:    The "Data Collection Information Summary" for patients in Inpatient Rehabilitation Facilities with attached "Privacy Act Statement-Health Care Records" was provided and verbally reviewed with: Patient and Family   Emergency Contact Information Contact Information       Name Relation Home Work Mobile    [place] Daughter     [phone]         Other Contacts       Name Relation Home Work Mobile    [place] Granddaughter [phone]             Current Medical History  Patient Admitting Diagnosis: CVA   History of Present Illness:  68 year old right-handed female with history significant for  anxiety, hypertension, iron  deficiency anemia, obesity status post gastric bypass with BMI 39.77, quit smoking 7 years ago, right TKA 2019 complicated by DVT.  Presented to [facility] 05/08/2023 after reported fall with altered mental status/headache, generalized weakness as well as blurred vision.  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  CT cervical spine negative.  CTA of head and neck with no emergent large vessel occlusion.  MRI showed acute infarct in the bilateral occipital lobes, left greater than right.  The bilateral occipital infarcts with foci of hemosiderin deposition possibly representing thromboemboli.  Additional punctate acute infarct in the right cerebellum and possible acute infarct in the right lateral pons.  Patient did not receive tPA.  Bilateral lower extremity ultrasound showed no evidence of DVT in either lower extremity and resolution of left posterior tibial vein DVT.  Admission chemistries unremarkable except sodium of 125, CO2 of 16, osmolality 273, urine osmolality 686, free T4 0.56, TSH 4.077.  Echocardiogram with ejection fraction of 55 to 60% no wall motion abnormalities grade 1 diastolic dysfunction.  Neurology follow-up placed on aspirin  81 mg daily and Plavix   75 mg daily for CVA prophylaxis x 3 weeks then aspirin  alone.  Lovenox  added for DVT prophylaxis.  Hyponatremia resolved after initial fluid restriction latest sodium of 141.  Tolerating a regular consistency diet.    Complete NIHSS TOTAL: 14   Patient's medical record from [facility] has been reviewed by the rehabilitation admission coordinator and physician.   Past Medical History      Past Medical History:  Diagnosis Date   Anxiety     Arthritis     Headache     Hypertension     Iron  deficiency anemia 06/17/2018   Obesity     Sleep apnea          Has the patient had major surgery during 100 days prior to admission? No   Family History    family history includes Arthritis in her mother; Bipolar disorder in her daughter; Cancer in her father; Hypertension in her mother.   Current Medications  Current Medications    Current Facility-Administered Medications:    acetaminophen  (TYLENOL ) tablet 650 mg, 650 mg, Oral, TID, 650 mg at 05/13/23 2119 **FOLLOWED BY** acetaminophen  (TYLENOL ) tablet 650 mg, 650 mg, Oral, Q6H PRN, Von Bellis, MD   alum & mag hydroxide-simeth (MAALOX/MYLANTA) 200-200-20 MG/5ML suspension 30 mL, 30 mL, Oral, Q4H PRN, Von Bellis, MD   aspirin  EC tablet 81 mg, 81 mg, Oral, Daily, Eldonna Elspeth PARAS, MD, 81 mg at 05/13/23 9068   atorvastatin  (LIPITOR) tablet 40 mg, 40 mg, Oral, Daily, Michaela Aisha SQUIBB, MD, 40 mg at 05/13/23 9075   baclofen  (LIORESAL ) tablet 5 mg, 5 mg, Oral, TID PRN, Lenon Marien CROME, MD, 5 mg at 05/12/23 1317   [COMPLETED] clopidogrel  (PLAVIX ) tablet 300 mg, 300 mg, Oral, Once, 300 mg at 05/08/23 2233 **AND** clopidogrel  (PLAVIX ) tablet 75 mg, 75 mg, Oral, Daily, Michaela Aisha SQUIBB, MD, 75 mg at 05/13/23 9075   [EXPIRED] diclofenac  Sodium (VOLTAREN ) 1 % topical gel 4 g, 4 g, Topical, TID, 4 g at 05/12/23 1534 **FOLLOWED BY** diclofenac  Sodium (VOLTAREN ) 1 % topical gel 4 g, 4 g, Topical, TID PRN, Von Bellis, MD   enoxaparin  (LOVENOX ) injection 40 mg, 40 mg, Subcutaneous, Q24H, Eldonna Elspeth PARAS, MD, 40 mg at 05/13/23 1612   folic acid  (FOLVITE ) tablet 1 mg, 1 mg, Oral, Daily, Von Bellis, MD, 1 mg at 05/13/23 9075   gabapentin  (NEURONTIN ) capsule 300 mg, 300 mg, Oral, BID, Von Bellis, MD, 300 mg at 05/13/23 2120   labetalol  (NORMODYNE ) injection 10 mg, 10 mg, Intravenous, Q2H PRN, Eldonna Elspeth PARAS, MD   oxyCODONE  (Oxy IR/ROXICODONE ) immediate release tablet 5 mg, 5 mg, Oral, Q6H PRN, Von Bellis, MD, 5 mg at 05/14/23 9370   pantoprazole  (PROTONIX ) EC tablet 40 mg, 40 mg, Oral, Daily, Eldonna Elspeth PARAS, MD, 40 mg at 05/13/23 9075   traZODone  (DESYREL ) tablet 100 mg, 100 mg,  Oral, QHS, Lenon Marien CROME, MD, 100 mg at 05/13/23 2119     Patients Current Diet:  Diet Order                  Diet regular Room service appropriate? Yes; Fluid consistency: Thin  Diet effective now                       Precautions / Restrictions Precautions Precautions: Fall Restrictions Weight Bearing Restrictions Per Provider Order: No    Has the patient had 2 or more falls or a fall with injury in the past year?  No   Prior Activity Level Community (5-7x/wk): drives, gets out of house daily   Prior Functional Level Self Care: Did the patient need help bathing, dressing, using the toilet or eating? Independent   Indoor Mobility: Did the patient need assistance with walking from room to room (with or without device)? Independent   Stairs: Did the patient need assistance with internal or external stairs (with or without device)? Independent   Functional Cognition: Did the patient need help planning regular tasks such as shopping or remembering to take medications? Independent   Patient Information Are you of Hispanic, Latino/a,or Spanish origin?: A. No, not of Hispanic, Latino/a, or Spanish origin What is your race?: B. Black or African American Do you need or want an interpreter to communicate with a doctor or health care staff?: 0. No   Patient's Response To:  Health Literacy and Transportation Is the patient able to respond to health literacy and transportation needs?: Yes Health Literacy - How often do you need to have someone help you when you read instructions, pamphlets, or other written material from your doctor or pharmacy?: Never In the past 12 months, has lack of transportation kept you from medical appointments or from getting medications?: No In the past 12 months, has lack of transportation kept you from meetings, work, or from getting things needed for daily living?: No   Home Assistive Devices / Equipment Home Equipment: Grab bars - toilet    Prior Device Use: Indicate devices/aids used by the patient prior to current illness, exacerbation or injury? None of the above   Current Functional Level Cognition   Arousal/Alertness: Awake/alert Overall Cognitive Status: No family/caregiver present to determine baseline cognitive functioning Orientation Level: Oriented X4 Following Commands: Follows multi-step commands with increased time Safety/Judgement: Decreased awareness of deficits Memory: Impaired Memory Impairment: Retrieval deficit Awareness: Appears intact Problem Solving: Appears intact Executive Function: Organizing, Reasoning Reasoning: Appears intact Organizing: Impaired Organizing Impairment: Functional complex, Verbal complex Safety/Judgment: Appears intact    Extremity Assessment (includes Sensation/Coordination)   Upper Extremity Assessment: Left hand dominant, LUE deficits/detail, Generalized weakness LUE Deficits / Details: 3/4 full ROM remains in shoulders likely d/t pain, continued weakness throughout LUE Coordination: decreased fine motor, decreased gross motor  Lower Extremity Assessment: Defer to PT evaluation LLE Deficits / Details: hip flexion 3-/5; knee flexion 3+/5; knee extension 3+/5; DF 3/5     ADLs   Overall ADL's : Needs assistance/impaired Eating/Feeding: Supervision/ safety, Sitting Grooming: Wash/dry face, Sitting, Set up, Wash/dry hands, Standing, Contact guard assist Upper Body Bathing: Supervision/ safety, Sitting Lower Body Bathing: Sit to/from stand, Minimal assistance Lower Body Bathing Details (indicate cue type and reason): Min A to bathe bil feet d/t pt unable to reach Lower Body Dressing: Maximal assistance, Sitting/lateral leans Lower Body Dressing Details (indicate cue type and reason): to doff/don socks seated in recliner Toilet Transfer: Regular Toilet, Contact guard assist, Grab bars Toilet Transfer Details (indicate cue type and reason): CGA for toilet transfer using grab  bar and RW Toileting- Clothing Manipulation and Hygiene: Contact guard assist, Sitting/lateral lean Toileting - Clothing Manipulation Details (indicate cue type and reason): CGA for hygiene on toilet Functional mobility during ADLs:  (approx 6' in room with MIN-MOD A with HHA, pt reports feeling dizzy therefore returned to seated on edge of bed)     Mobility   Overal bed mobility: Needs Assistance Bed Mobility: Sit to Supine Supine to sit: Supervision, Used rails, HOB elevated Sit to supine: Modified independent (Device/Increase time), HOB elevated  General bed mobility comments: increased time and effort to reach EOB today-possibly due to pain     Transfers   Overall transfer level: Needs assistance Equipment used: Rolling walker (2 wheels) Transfers: Sit to/from Stand Sit to Stand: Min assist Bed to/from chair/wheelchair/BSC transfer type:: Step pivot Step pivot transfers: Min assist General transfer comment: assist to steady standing with RW use     Ambulation / Gait / Stairs / Wheelchair Mobility   Ambulation/Gait Ambulation/Gait assistance: Contact guard assist, Min assist Gait Distance (Feet): 100 Feet Assistive device: Rolling walker (2 wheels) (youth sized) Gait Pattern/deviations: Decreased step length - right, Decreased step length - left General Gait Details: mild decreased stance time L LE; assist to steady intermittently with fatigue; R hip drop during R LE advancement; 2 mild loss of balance requiring min assist to steady; more purposeful/effortful steps noted Gait velocity: decreased/slower gait speed today     Posture / Balance Dynamic Sitting Balance Sitting balance - Comments: steady sitting reaching within BOS Balance Overall balance assessment: Needs assistance Sitting-balance support: No upper extremity supported, Feet supported Sitting balance-Leahy Scale: Good Sitting balance - Comments: steady sitting reaching within BOS Standing balance support: Bilateral  upper extremity supported, During functional activity, Reliant on assistive device for balance Standing balance-Leahy Scale: Poor Standing balance comment: CGA to min assist for ambulation with RW use; min assist static standing no UE support     Special needs/care consideration Zio patch to be ordered to apply at discharge    Previous Home Environment  Living Arrangements: Parent  Lives With: Other (Comment) (mother. Mother will be going to live with sister soon) Available Help at Discharge: Family, Available PRN/intermittently Type of Home: House Home Layout: One level Home Access: Ramped entrance Entrance Stairs-Rails: Can reach both Entrance Stairs-Number of Steps: 3 Bathroom Shower/Tub: Engineer[employer]: Handicapped height Bathroom Accessibility: Yes How Accessible: Accessible via walker Home Care Services: No   Discharge Living Setting Plans for Discharge Living Setting: Patient's home Type of Home at Discharge: House Discharge Home Layout: One level Discharge Home Access: Ramped entrance Discharge Bathroom Shower/Tub: Tub/shower unit Discharge Bathroom Toilet: Handicapped height Discharge Bathroom Accessibility: Yes How Accessible: Accessible via walker Does the patient have any problems obtaining your medications?: No   Social/Family/Support Systems Anticipated Caregiver: [name] Daring, daughter and pt's granddaughter Anticipated Caregiver's Contact Information: [phone] Caregiver Availability: Intermittent Discharge Plan Discussed with Primary Caregiver: Yes Is Caregiver In Agreement with Plan?: Yes Does Caregiver/Family have Issues with Lodging/Transportation while Pt is in Rehab?: No   Goals Patient/Family Goal for Rehab: supervision PT, supervision to min OT, supervision SLP Expected length of stay: ELOS 10 to 14 days Pt/Family Agrees to Admission and willing to participate: Yes Program Orientation Provided & Reviewed with Pt/Caregiver  Including Roles  & Responsibilities: Yes   Decrease burden of Care through IP rehab admission: NA   Possible need for SNF placement upon discharge: Not anticipated   Patient Condition: I have reviewed medical records from [facility], spoken with CM, and patient and daughter. I discussed via phone for inpatient rehabilitation assessment.  Patient will benefit from ongoing PT, OT, and SLP, can actively participate in 3 hours of therapy a day 5 days of the week, and can make measurable gains during the admission.  Patient will also benefit from the coordinated team approach during an Inpatient Acute Rehabilitation admission.  The patient will receive intensive therapy as well as Rehabilitation physician, nursing, social worker, and care management interventions.  Due  to safety, disease management, medication administration, pain management, and patient education the patient requires 24 hour a day rehabilitation nursing.  The patient is currently min assist with mobility and basic ADLs.  Discharge setting and therapy post discharge at home with home health is anticipated.  Patient has agreed to participate in the Acute Inpatient Rehabilitation Program and will admit today.   Preadmission Screen Completed By:  Tinnie SHAUNNA Yvone Delayne, 05/14/2023 10:18 AM ______________________________________________________________________   Discussed status with Dr. Emeline on 05/14/23  at 10:18 AM  and received approval for admission today.   Admission Coordinator:  Tinnie SHAUNNA Yvone Delayne, CCC-SLP, time 10:18 AM Pattricia 05/14/23     Assessment/Plan: Diagnosis: L occipital CVA  Does the need for close, 24 hr/day Medical supervision in concert with the patient's rehab needs make it unreasonable for this patient to be served in a less intensive setting? Yes Co-Morbidities requiring supervision/potential complications: Hyponatremia, GERD, hypotension, spasticity, poor pain control Due to safety, disease  management, medication administration, pain management, and patient education, does the patient require 24 hr/day rehab nursing? Yes Does the patient require coordinated care of a physician, rehab nurse, PT, OT, and SLP to address physical and functional deficits in the context of the above medical diagnosis(es)? Yes Addressing deficits in the following areas: balance, endurance, locomotion, strength, transferring, bathing, dressing, feeding, grooming, toileting, cognition, speech, language, and psychosocial support Can the patient actively participate in an intensive therapy program of at least 3 hrs of therapy 5 days a week? Yes The potential for patient to make measurable gains while on inpatient rehab is good Anticipated functional outcomes upon discharge from inpatient rehab: supervision PT, supervision OT, supervision SLP Estimated rehab length of stay to reach the above functional goals is: 10-14 days Anticipated discharge destination: Home 10. Overall Rehab/Functional Prognosis: good     MD Signature:   Joesph JAYSON Emeline, DO 05/14/2023           Revision History  Routing History

## 2023-05-14 NOTE — Discharge Instructions (Addendum)
 Inpatient Rehab Discharge Instructions  Christine Reyes Discharge date and time: No discharge date for patient encounter.   Activities/Precautions/ Functional Status: Activity: activity as tolerated Diet: regular diet Wound Care: Routine skin checks Functional status:  ___ No restrictions     ___ Walk up steps independently ___ 24/7 supervision/assistance   ___ Walk up steps with assistance ___ Intermittent supervision/assistance  ___ Bathe/dress independently ___ Walk with walker     _x__ Bathe/dress with assistance ___ Walk Independently    ___ Shower independently ___ Walk with assistance    ___ Shower with assistance ___ No alcohol     ___ Return to work/school ________  Special Instructions: No driving smoking or alcoholSTROKE/TIA DISCHARGE INSTRUCTIONS SMOKING Cigarette smoking nearly doubles your risk of having a stroke & is the single most alterable risk factor  If you smoke or have smoked in the last 12 months, you are advised to quit smoking for your health. Most of the excess cardiovascular risk related to smoking disappears within a year of stopping. Ask you doctor about anti-smoking medications [place] Quit Line: 1-800-QUIT NOW Free Smoking Cessation Classes [phone]  CHOLESTEROL Know your levels; limit fat & cholesterol in your diet  Lipid Panel     Component Value Date/Time   CHOL 132 05/09/2023 0254   TRIG 60 05/09/2023 0254   HDL 31 (L) 05/09/2023 0254   CHOLHDL 4.3 05/09/2023 0254   VLDL 12 05/09/2023 0254   LDLCALC 89 05/09/2023 0254     Many patients benefit from treatment even if their cholesterol is at goal. Goal: Total Cholesterol (CHOL) less than 160 Goal:  Triglycerides (TRIG) less than 150 Goal:  HDL greater than 40 Goal:  LDL (LDLCALC) less than 100   BLOOD PRESSURE American Stroke Association blood pressure target is less that 120/80 mm/Hg  Your discharge blood pressure is:    Monitor your blood pressure Limit your salt and alcohol intake Many  individuals will require more than one medication for high blood pressure  DIABETES (A1c is a blood sugar average for last 3 months) Goal HGBA1c is under 7% (HBGA1c is blood sugar average for last 3 months)  Diabetes: No known diagnosis of diabetes    Lab Results  Component Value Date   HGBA1C 4.3 (L) 05/08/2023    Your HGBA1c can be lowered with medications, healthy diet, and exercise. Check your blood sugar as directed by your physician Call your physician if you experience unexplained or low blood sugars.  PHYSICAL ACTIVITY/REHABILITATION Goal is 30 minutes at least 4 days per week  Activity: Increase activity slowly, Therapies: Physical Therapy: Home Health Return to work:  Activity decreases your risk of heart attack and stroke and makes your heart stronger.  It helps control your weight and blood pressure; helps you relax and can improve your mood. Participate in a regular exercise program. Talk with your doctor about the best form of exercise for you (dancing, walking, swimming, cycling).  DIET/WEIGHT Goal is to maintain a healthy weight  Your discharge diet is:  Diet Order             Diet regular Room service appropriate? Yes; Fluid consistency: Thin  Diet effective now                   liquids Your height is:    Your current weight is:   Your Body Mass Index (BMI) is:    Following the type of diet specifically designed for you will help prevent another  stroke. Your goal weight range is:   Your goal Body Mass Index (BMI) is 19-24. Healthy food habits can help reduce 3 risk factors for stroke:  High cholesterol, hypertension, and excess weight.  RESOURCES Stroke/Support Group:  Call [phone]   STROKE EDUCATION PROVIDED/REVIEWED AND GIVEN TO PATIENT Stroke warning signs and symptoms How to activate emergency medical system (call 911). Medications prescribed at discharge. Need for follow-up after discharge. Personal risk factors for stroke. Pneumonia vaccine  given: No Flu vaccine given: No My questions have been answered, the writing is legible, and I understand these instructions.  I will adhere to these goals & educational materials that have been provided to me after my discharge from the hospital.    COMMUNITY REFERRALS UPON DISCHARGE:    Outpatient: PT     OT    ST             Agency: [facility]          Phone: [phone]             Appointment Date/Time: *Please expect follow-up within 7-10 business days to schedule your appointment. If you have not received follow-up, be sure to contact the site directly.*     My questions have been answered and I understand these instructions. I will adhere to these goals and the provided educational materials after my discharge from the hospital.  Patient/Caregiver Signature _______________________________ Date __________  Clinician Signature _______________________________________ Date __________  Please bring this form and your medication list with you to all your follow-up doctor's appointments.

## 2023-05-14 NOTE — Discharge Summary (Signed)
 [facility] Discharge Summary   Patient: Christine Reyes FMW:[id number]  PCP: Sadie Manna, MD  Date of admission: 05/08/2023   Date of discharge:  05/14/2023     Discharge Diagnoses:  Principal Problem:   CVA (cerebral vascular accident) [facility]) Active Problems:   Hyponatremia   Hypertensive disorder   Gastroesophageal reflux disease without esophagitis   Admitted From: Home Disposition: Inpatient rehab  Recommendations for Outpatient Follow-up:  Follow with PCP, patient should be seen by an MD in 1 to 2 days.  Monitor BP, it remains low so discontinued Benicar  home med.  Follow-up with neurology in 1 to 2 weeks Follow-up with cardiology for loop monitor Follow up LABS/TEST:     Follow-up Information     Sadie Manna, MD Follow up.   Specialty: Internal Medicine Why: Hospital follow up Contact information: [address] [place] [address] [phone]                Diet recommendation: Cardiac diet  Activity: The patient is advised to gradually reintroduce usual activities, as tolerated  Discharge Condition: stable  Code Status: Full code   History of present illness: As per the H and P dictated on admission Hospital Course:  Christine Reyes is a 68 y.o. 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   ED course: afebrile, blood pressure 130s to 150s over 70s to 110s.  Satting well on room air.  White count 6.9, hemoglobin 11.8, platelets 402, lactate 0.4.  Troponin within normal limits.  Urinalysis stable.  Creatinine 0.85.  Sodium 125.  Found to be orthostatic positive in the ER.  CT head with acute to subacute cortical infarcts in the left occipital lobe. MRI brain: multiple acute infarcts- bilateral occipital lobes, punctate right cerebellum and possible right lateral pons. CTA head and neck- no emergent  large vessel occlusion.  LE doppler negative for DVT.  Echo done as below Neurology was consulted.  PT/OT recommending CIR.   Assessment & Plan  # Acute embolic CVA (cerebral vascular accident) MRI brain: multiple acute infarcts- bilateral occipital lobes, punctate right cerebellum and possible right lateral pons. No DVT in LE doppler. No severe occlusive disease on head/neck CTA. Symptoms have improved and complains of visual field loss, confusion, and headache.  TTE LVEF 55 to 60%, grade 1 diastolic dysfunction. Cardiology consulted for Zio patch for 2 weeks Neurology consulted, aspirin , plavix  for 3 weeks, followed by aspirin  only. Continue high dose statin Follow-up with neurology in 1 to 2 weeks.  Follow-up with cardiology for loop monitor.    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 Not recommended to be on eliquis any more but she states that she takes it intermittently when she has knee pain. Last time was about 2 weeks ago. S/p Lovenox  for DVT prophylaxis # Hyponatremia- Na 125 on presentation- resolved # Gastroesophageal reflux disease without esophagitis: Continue pantoprazole  40 mg p.o. twice daily prn # Folic acid  level 6.4, at lower end, started folic acid  1 mg p.o. daily.  Follow-up with PCP to repeat folic acid  level after 3 to 6 months. # Abnormal thyroid function test Patient has been previously tested for hypo versus hypothyroid TSH 4.07 wnl but at higher end, free T4 level 0.56 low Recommended to repeat thyroid profile in 4 to 6 weeks and follow-up with endocrine as an outpatient for further management. #  Neck pain, most likely musculoskeletal, s/p Voltaren  gel.  Continue Tylenol  and try Robaxin  prn   Body mass index is 39.77 kg/m.  Nutrition Interventions:  - Patient was instructed, not to drive, operate heavy machinery, perform activities at heights, swimming or participation in water activities or provide baby sitting services while on Pain, Sleep  and Anxiety Medications; until her outpatient Physician has advised to do so again.  - Also recommended to not to take more than prescribed Pain, Sleep and Anxiety Medications.  Patient was seen by physical therapy, who recommended Therapy, patient rehab, which was arranged. On the day of the discharge the patient's vitals were stable, and no other acute medical condition were reported by patient. the patient was felt safe to be discharge at inpatient rehab.   Consultants: Neurology Procedures: None  Discharge Exam: General: Appear in no distress, no Rash; Oral Mucosa Clear, moist. Cardiovascular: S1 and S2 Present, no Murmur, Respiratory: normal respiratory effort, Bilateral Air entry present and no Crackles, no wheezes Abdomen: Bowel Sound present, Soft and no tenderness, no hernia Extremities: no Pedal edema, no calf tenderness Neurology: AAOx 4, b/l lower extremity weakness power 3-4/5, no weakness in the upper extremities.  Poor vision in the right eye. affect appropriate.  Filed Weights   05/08/23 1535  Weight: 83.4 kg   Vitals:   05/14/23 0838 05/14/23 1146  BP: (!) 105/57 101/69  Pulse: 74 79  Resp: 16 18  Temp: 98.1 F (36.7 C) 98.4 F (36.9 C)  SpO2: 92% 92%    DISCHARGE MEDICATION: Allergies as of 05/14/2023       Reactions   Morphine  And Codeine Itching   Tramadol Itching        Medication List     STOP taking these medications    ARTHRITIS STRENGTH BC POWDER PO   dicyclomine  20 MG tablet Commonly known as: BENTYL    ibuprofen  600 MG tablet Commonly known as: ADVIL    Narcan 4 MG/0.1ML Liqd nasal spray kit Generic drug: naloxone   olmesartan -hydrochlorothiazide  40-25 MG tablet Commonly known as: BENICAR  HCT   Oxycodone  HCl 20 MG Tabs       TAKE these medications    acetaminophen  325 MG tablet Commonly known as: Tylenol  Take 2 tablets (650 mg total) by mouth every 6 (six) hours as needed for headache, fever, mild pain (pain score 1-3) or  moderate pain (pain score 4-6).   aspirin  EC 81 MG tablet Take 1 tablet (81 mg total) by mouth daily. Swallow whole. Start taking on: May 15, 2023   atorvastatin  40 MG tablet Commonly known as: LIPITOR Take 1 tablet (40 mg total) by mouth daily. Start taking on: May 15, 2023   clopidogrel  75 MG tablet Commonly known as: PLAVIX  Take 1 tablet (75 mg total) by mouth daily for 15 days. Start taking on: May 15, 2023   folic acid  1 MG tablet Commonly known as: FOLVITE  Take 1 tablet (1 mg total) by mouth daily. Start taking on: May 15, 2023   gabapentin  300 MG capsule Commonly known as: Neurontin  Take 1 capsule (300 mg total) by mouth 2 (two) times daily.   Iron -Vitamin C  65-125 MG Tabs Take 1 tablet by mouth daily.   methocarbamol  500 MG tablet Commonly known as: ROBAXIN  Take 1 tablet (500 mg total) by mouth every 8 (eight) hours as needed for muscle spasms.   ondansetron  4 MG disintegrating tablet Commonly known as: ZOFRAN -ODT Take 1 tablet (4 mg total) by mouth every 8 (eight) hours as  needed for nausea or vomiting.   pantoprazole  40 MG tablet Commonly known as: PROTONIX  Take 40 mg by mouth 2 (two) times daily as needed (heartburn).   traZODone  100 MG tablet Commonly known as: DESYREL  Take 0.5 tablets (50 mg total) by mouth at bedtime as needed for sleep.       Allergies  Allergen Reactions   Morphine  And Codeine Itching   Tramadol Itching   Discharge Instructions     Ambulatory referral to Neurology   Complete by: As directed    An appointment is requested in approximately: 2 weeks   Call MD for:   Complete by: As directed    Any weakness or numbness, any new neurological symptoms   Call MD for:  difficulty breathing, headache or visual disturbances   Complete by: As directed    Call MD for:  extreme fatigue   Complete by: As directed    Call MD for:  persistant dizziness or light-headedness   Complete by: As directed    Call MD for:   persistant nausea and vomiting   Complete by: As directed    Call MD for:  severe uncontrolled pain   Complete by: As directed    Call MD for:  temperature >100.4   Complete by: As directed    Diet - low sodium heart healthy   Complete by: As directed    Discharge instructions   Complete by: As directed    Follow with PCP, patient should be seen by an MD in 1 to 2 days.  Monitor BP, it remains low so discontinued Benicar  home med.  Follow-up with neurology in 1 to 2 weeks Follow-up with cardiology for loop monitor   Increase activity slowly   Complete by: As directed        The results of significant diagnostics from this hospitalization (including imaging, microbiology, ancillary and laboratory) are listed below for reference.    Significant Diagnostic Studies: ECHOCARDIOGRAM COMPLETE BUBBLE STUDY Result Date: 05/12/2023    ECHOCARDIOGRAM REPORT   Patient Name:   MARKETA MIDKIFF Date of Exam: 05/12/2023 Medical Rec #:  [id number]       Height:       57.0 in Accession #:    [id number]      Weight:       183.8 lb Date of Birth:  September 02, 1955      BSA:          1.735 m Patient Age:    67 years        BP:           125/68 mmHg Patient Gender: F               HR:           62 bpm. Exam Location:  [facility] Procedure: 2D Echo, Cardiac Doppler, Color Doppler and Saline Contrast Bubble            Study Indications:     Stroke 434.91 / I63.9  History:         Patient has prior history of Echocardiogram examinations, most                  recent 05/11/2023.  Sonographer:     Christopher Furnace Referring Phys:  [id number] ELVAN SOR Diagnosing Phys: Redell Cave MD  Sonographer Comments: Image acquisition challenging due to respiratory motion. IMPRESSIONS  1. Left ventricular ejection fraction, by estimation, is 55 to 60%. The left ventricle has normal function. The  left ventricle has no regional wall motion abnormalities. Left ventricular diastolic parameters are consistent with Grade I diastolic dysfunction  (impaired relaxation).  2. Right ventricular systolic function is normal. The right ventricular size is normal.  3. The mitral valve is normal in structure. Trivial mitral valve regurgitation.  4. The aortic valve is tricuspid. Aortic valve regurgitation is not visualized. Aortic valve sclerosis is present, with no evidence of aortic valve stenosis.  5. The inferior vena cava is normal in size with greater than 50% respiratory variability, suggesting right atrial pressure of 3 mmHg.  6. Agitated saline contrast bubble study was negative, with no evidence of any interatrial shunt. FINDINGS  Left Ventricle: Left ventricular ejection fraction, by estimation, is 55 to 60%. The left ventricle has normal function. The left ventricle has no regional wall motion abnormalities. The left ventricular internal cavity size was normal in size. There is  no left ventricular hypertrophy. Left ventricular diastolic parameters are consistent with Grade I diastolic dysfunction (impaired relaxation). Right Ventricle: The right ventricular size is normal. No increase in right ventricular wall thickness. Right ventricular systolic function is normal. Left Atrium: Left atrial size was normal in size. Right Atrium: Right atrial size was normal in size. Pericardium: There is no evidence of pericardial effusion. Mitral Valve: The mitral valve is normal in structure. Trivial mitral valve regurgitation. MV peak gradient, 4.6 mmHg. The mean mitral valve gradient is 2.0 mmHg. Tricuspid Valve: The tricuspid valve is normal in structure. Tricuspid valve regurgitation is mild. Aortic Valve: The aortic valve is tricuspid. Aortic valve regurgitation is not visualized. Aortic valve sclerosis is present, with no evidence of aortic valve stenosis. Aortic valve mean gradient measures 2.0 mmHg. Aortic valve peak gradient measures 5.3  mmHg. Aortic valve area, by VTI measures 2.67 cm. Pulmonic Valve: The pulmonic valve was normal in structure. Pulmonic  valve regurgitation is not visualized. Aorta: The aortic root is normal in size and structure. Venous: The inferior vena cava is normal in size with greater than 50% respiratory variability, suggesting right atrial pressure of 3 mmHg. IAS/Shunts: No atrial level shunt detected by color flow Doppler. Agitated saline contrast was given intravenously to evaluate for intracardiac shunting. Agitated saline contrast bubble study was negative, with no evidence of any interatrial shunt.  LEFT VENTRICLE PLAX 2D LVIDd:         4.20 cm   Diastology LVIDs:         2.50 cm   LV e' medial:    6.74 cm/s LV PW:         0.80 cm   LV E/e' medial:  12.3 LV IVS:        1.00 cm   LV e' lateral:   6.20 cm/s LVOT diam:     2.00 cm   LV E/e' lateral: 13.4 LV SV:         59 LV SV Index:   34 LVOT Area:     3.14 cm  RIGHT VENTRICLE RV Basal diam:  2.70 cm RV Mid diam:    2.10 cm RV S prime:     8.27 cm/s TAPSE (M-mode): 1.3 cm LEFT ATRIUM             Index        RIGHT ATRIUM          Index LA diam:        1.80 cm 1.04 cm/m   RA Area:     8.76 cm LA Vol (A2C):  23.2 ml 13.37 ml/m  RA Volume:   16.80 ml 9.68 ml/m LA Vol (A4C):   25.5 ml 14.70 ml/m LA Biplane Vol: 26.5 ml 15.27 ml/m  AORTIC VALVE AV Area (Vmax):    2.57 cm AV Area (Vmean):   2.98 cm AV Area (VTI):     2.67 cm AV Vmax:           115.00 cm/s AV Vmean:          69.200 cm/s AV VTI:            0.221 m AV Peak Grad:      5.3 mmHg AV Mean Grad:      2.0 mmHg LVOT Vmax:         94.20 cm/s LVOT Vmean:        65.600 cm/s LVOT VTI:          0.188 m LVOT/AV VTI ratio: 0.85  AORTA Ao Root diam: 2.70 cm MITRAL VALVE                TRICUSPID VALVE MV Area (PHT): 4.15 cm     TR Peak grad:   13.8 mmHg MV Area VTI:   1.95 cm     TR Vmax:        186.00 cm/s MV Peak grad:  4.6 mmHg MV Mean grad:  2.0 mmHg     SHUNTS MV Vmax:       1.07 m/s     Systemic VTI:  0.19 m MV Vmean:      66.0 cm/s    Systemic Diam: 2.00 cm MV Decel Time: 183 msec MV E velocity: 82.80 cm/s MV A velocity:  106.00 cm/s MV E/A ratio:  0.78 Redell Cave MD Electronically signed by Redell Cave MD Signature Date/Time: 05/12/2023/2:11:55 PM    Final    ECHOCARDIOGRAM COMPLETE Result Date: 05/11/2023    ECHOCARDIOGRAM REPORT   Patient Name:   RASHAD OBEID Date of Exam: 05/11/2023 Medical Rec #:  [id number]       Height:       57.0 in Accession #:    [id number]      Weight:       183.8 lb Date of Birth:  May 31, 1955      BSA:          1.735 m Patient Age:    67 years        BP:           131/71 mmHg Patient Gender: F               HR:           70 bpm. Exam Location:  [facility] Procedure: 2D Echo, Cardiac Doppler and Color Doppler Indications:     TIA  History:         Patient has no prior history of Echocardiogram examinations.                  CHF, TIA and Stroke; Risk Factors:Hypertension and Sleep Apnea.  Sonographer:     Naomie Reef Referring Phys:  [phone] ELSPETH PARAS NEWTON Diagnosing Phys: Cara JONETTA Lovelace MD  Sonographer Comments: Image acquisition challenging due to respiratory motion. IMPRESSIONS  1. Left ventricular ejection fraction, by estimation, is 55 to 60%. The left ventricle has normal function. The left ventricle has no regional wall motion abnormalities. Left ventricular diastolic parameters are consistent with Grade I diastolic dysfunction (impaired relaxation).  2. Right ventricular systolic function is normal. The  right ventricular size is normal. There is normal pulmonary artery systolic pressure.  3. The mitral valve is grossly normal. No evidence of mitral valve regurgitation.  4. The aortic valve is normal in structure. Aortic valve regurgitation is not visualized. Aortic valve sclerosis is present, with no evidence of aortic valve stenosis. FINDINGS  Left Ventricle: Left ventricular ejection fraction, by estimation, is 55 to 60%. The left ventricle has normal function. The left ventricle has no regional wall motion abnormalities. The left ventricular internal cavity size was normal in  size. There is  no left ventricular hypertrophy. Left ventricular diastolic parameters are consistent with Grade I diastolic dysfunction (impaired relaxation). Right Ventricle: The right ventricular size is normal. No increase in right ventricular wall thickness. Right ventricular systolic function is normal. There is normal pulmonary artery systolic pressure. The tricuspid regurgitant velocity is 2.57 m/s, and  with an assumed right atrial pressure of 3 mmHg, the estimated right ventricular systolic pressure is 29.4 mmHg. Left Atrium: Left atrial size was normal in size. Right Atrium: Right atrial size was normal in size. Pericardium: There is no evidence of pericardial effusion. Mitral Valve: The mitral valve is grossly normal. No evidence of mitral valve regurgitation. MV peak gradient, 5.3 mmHg. The mean mitral valve gradient is 2.0 mmHg. Tricuspid Valve: The tricuspid valve is normal in structure. Tricuspid valve regurgitation is trivial. Aortic Valve: The aortic valve is normal in structure. Aortic valve regurgitation is not visualized. Aortic valve sclerosis is present, with no evidence of aortic valve stenosis. Aortic valve mean gradient measures 6.0 mmHg. Aortic valve peak gradient measures 13.0 mmHg. Aortic valve area, by VTI measures 2.06 cm. Pulmonic Valve: The pulmonic valve was normal in structure. Pulmonic valve regurgitation is not visualized. Aorta: The ascending aorta was not well visualized. IAS/Shunts: No atrial level shunt detected by color flow Doppler.  LEFT VENTRICLE PLAX 2D LVIDd:         4.20 cm     Diastology LVIDs:         2.70 cm     LV e' medial:    10.10 cm/s LV PW:         0.80 cm     LV E/e' medial:  8.3 LV IVS:        1.10 cm     LV e' lateral:   12.40 cm/s LVOT diam:     1.80 cm     LV E/e' lateral: 6.8 LV SV:         76 LV SV Index:   44 LVOT Area:     2.54 cm  LV Volumes (MOD) LV vol d, MOD A2C: 39.1 ml LV vol d, MOD A4C: 44.7 ml LV vol s, MOD A2C: 19.1 ml LV vol s, MOD A4C:  17.5 ml LV SV MOD A2C:     20.0 ml LV SV MOD A4C:     44.7 ml LV SV MOD BP:      23.5 ml RIGHT VENTRICLE RV Basal diam:  3.00 cm RV Mid diam:    2.50 cm RV S prime:     11.40 cm/s LEFT ATRIUM             Index        RIGHT ATRIUM           Index LA diam:        3.10 cm 1.79 cm/m   RA Area:     11.70 cm LA Vol (A2C):   53.0 ml 30.54  ml/m  RA Volume:   22.60 ml  13.02 ml/m LA Vol (A4C):   40.3 ml 23.22 ml/m LA Biplane Vol: 48.7 ml 28.07 ml/m  AORTIC VALVE                     PULMONIC VALVE AV Area (Vmax):    2.06 cm      PV Vmax:       1.00 m/s AV Area (Vmean):   1.87 cm      PV Peak grad:  4.0 mmHg AV Area (VTI):     2.06 cm AV Vmax:           180.00 cm/s AV Vmean:          115.000 cm/s AV VTI:            0.370 m AV Peak Grad:      13.0 mmHg AV Mean Grad:      6.0 mmHg LVOT Vmax:         146.00 cm/s LVOT Vmean:        84.300 cm/s LVOT VTI:          0.299 m LVOT/AV VTI ratio: 0.81  AORTA Ao Root diam: 3.00 cm MITRAL VALVE                TRICUSPID VALVE MV Area (PHT): 2.73 cm     TR Peak grad:   26.4 mmHg MV Area VTI:   2.46 cm     TR Vmax:        257.00 cm/s MV Peak grad:  5.3 mmHg MV Mean grad:  2.0 mmHg     SHUNTS MV Vmax:       1.15 m/s     Systemic VTI:  0.30 m MV Vmean:      58.3 cm/s    Systemic Diam: 1.80 cm MV Decel Time: 278 msec MV E velocity: 83.80 cm/s MV A velocity: 106.00 cm/s MV E/A ratio:  0.79 Dwayne D Callwood MD Electronically signed by Cara JONETTA Lovelace MD Signature Date/Time: 05/11/2023/6:24:25 PM    Final    US  Venous Img Lower Bilateral (DVT) Result Date: 05/09/2023 CLINICAL DATA:  Lower extremity pain. History of prior left posterior tibial vein DVT in 2022. EXAM: BILATERAL LOWER EXTREMITY VENOUS DOPPLER ULTRASOUND TECHNIQUE: Gray-scale sonography with graded compression, as well as color Doppler and duplex ultrasound were performed to evaluate the lower extremity deep venous systems from the level of the common femoral vein and including the common femoral, femoral, profunda  femoral, popliteal and calf veins including the posterior tibial, peroneal and gastrocnemius veins when visible. The superficial great saphenous vein was also interrogated. Spectral Doppler was utilized to evaluate flow at rest and with distal augmentation maneuvers in the common femoral, femoral and popliteal veins. COMPARISON:  Report from a prior lower extremity venous duplex study at [facility] dated 12/02/2020 FINDINGS: RIGHT LOWER EXTREMITY Common Femoral Vein: No evidence of thrombus. Normal compressibility, respiratory phasicity and response to augmentation. Saphenofemoral Junction: No evidence of thrombus. Normal compressibility and flow on color Doppler imaging. Profunda Femoral Vein: No evidence of thrombus. Normal compressibility and flow on color Doppler imaging. Femoral Vein: No evidence of thrombus. Normal compressibility, respiratory phasicity and response to augmentation. Popliteal Vein: No evidence of thrombus. Normal compressibility, respiratory phasicity and response to augmentation. Calf Veins: No evidence of thrombus. Normal compressibility and flow on color Doppler imaging. Superficial Great Saphenous Vein: No evidence of thrombus. Normal compressibility. Venous Reflux:  None. Other  Findings: No evidence of superficial thrombophlebitis or abnormal fluid collection. LEFT LOWER EXTREMITY Common Femoral Vein: No evidence of thrombus. Normal compressibility, respiratory phasicity and response to augmentation. Saphenofemoral Junction: No evidence of thrombus. Normal compressibility and flow on color Doppler imaging. Profunda Femoral Vein: No evidence of thrombus. Normal compressibility and flow on color Doppler imaging. Femoral Vein: No evidence of thrombus. Normal compressibility, respiratory phasicity and response to augmentation. Popliteal Vein: No evidence of thrombus. Normal compressibility, respiratory phasicity and response to augmentation. Calf Veins: No evidence of thrombus. No evidence of  further left posterior tibial vein thrombus or visible chronic thrombus. Normal compressibility and flow on color Doppler imaging. Superficial Great Saphenous Vein: No evidence of thrombus. Normal compressibility. Venous Reflux:  None. Other Findings: No evidence of superficial thrombophlebitis or abnormal fluid collection. IMPRESSION: No evidence of deep venous thrombosis in either lower extremity. Resolution of left posterior tibial vein DVT. Electronically Signed   By: Marcey Moan M.D.   On: 05/09/2023 13:00   MR BRAIN WO CONTRAST Result Date: 05/08/2023 CLINICAL DATA:  Stroke suspected, weakness that started last night around 11 p.m. EXAM: MRI HEAD WITHOUT CONTRAST TECHNIQUE: Multiplanar, multiecho pulse sequences of the brain and surrounding structures were obtained without intravenous contrast. COMPARISON:  No prior MRI available, correlation is made with 05/08/2023 CT head FINDINGS: Brain: Restricted diffusion with ADC correlate in the left occipital lobe, both cortex and subcortical white matter (series 9, images 18-27), which correlates with the hypodensity seen on the same-day CT. Additional smaller area of restricted diffusion with ADC correlate in the right occipital cortex (series 9, images 25-29). The bilateral occipital infarcts are associated with foci of hemosiderin deposition (series 13, image 32 on the right and 25 on the left), which may represent thromboemboli. These areas are associated with increased T2 hyperintense signal. Additional punctate foci of restricted diffusion in the right cerebellum (series 9, images 17-19 and 21). Possible restricted diffusion with ADC correlate in the right lateral pons (series 9, image 19). No acute hemorrhage, mass, mass effect, or midline shift. No hydrocephalus or extra-axial collection. Pituitary and craniocervical junction within normal limits. Remote infarcts in the left cerebellum. Cerebral volume is normal for age. Vascular: Normal arterial flow  voids. Skull and upper cervical spine: Normal marrow signal. Sinuses/Orbits: Clear paranasal sinuses. No acute finding in the orbits. Other: The mastoid air cells are well aerated. IMPRESSION: 1. Acute infarcts in the bilateral occipital lobes, left greater than right, which correlate with the hypodensity seen on the same-day CT. The bilateral occipital infarcts are associated with foci of hemosiderin deposition, which may represent thromboemboli. 2. Additional punctate acute infarcts in the right cerebellum and possible acute infarct in the right lateral pons. These results will be called to the ordering clinician or representative by the Radiologist Assistant, and communication documented in the PACS or [employer]. Electronically Signed   By: Donald Campion M.D.   On: 05/08/2023 21:57   CT ANGIO HEAD NECK W WO CM Result Date: 05/08/2023 CLINICAL DATA:  Weakness beginning last night.  Syncopal episode. EXAM: CT ANGIOGRAPHY HEAD AND NECK WITH AND WITHOUT CONTRAST TECHNIQUE: Multidetector CT imaging of the head and neck was performed using the standard protocol during bolus administration of intravenous contrast. Multiplanar CT image reconstructions and MIPs were obtained to evaluate the vascular anatomy. Carotid stenosis measurements (when applicable) are obtained utilizing NASCET criteria, using the distal internal carotid diameter as the denominator. RADIATION DOSE REDUCTION: This exam was performed according to the departmental dose-optimization program  which includes automated exposure control, adjustment of the mA and/or kV according to patient size and/or use of iterative reconstruction technique. CONTRAST:  75mL OMNIPAQUE  IOHEXOL  350 MG/ML SOLN COMPARISON:  CT head without contrast 05/08/2023 and 01/09/2021. FINDINGS: CTA NECK FINDINGS Aortic arch: Atherosclerotic calcifications are present in the aortic arch. No aneurysm or stenosis is present. Great vessel origins are within normal limits. Right  carotid system: The right common carotid artery is within normal limits. The bifurcation is unremarkable. Mild tortuosity of the cervical right ICA is present without significant stenosis. Left carotid system: The left common carotid artery is within normal limits. Minimal calcification is present at the bifurcation. Mild tortuosity of the cervical left ICA is present without significant stenosis. Vertebral arteries: The left vertebral artery is slightly dominant. Both vertebral arteries originate from the subclavian arteries without significant stenosis. No significant stenosis is present in either vertebral artery in the neck. Skeleton: Mild degenerative changes are present cervical spine. Vertebral body heights and alignment are normal. No focal osseous lesions are present. Other neck: Soft tissues the neck are otherwise unremarkable. Salivary glands are within normal limits. Thyroid is normal. No significant adenopathy is present. No focal mucosal or submucosal lesions are present. Upper chest: The lung apices are clear. The thoracic inlet is within normal limits. Review of the MIP images confirms the above findings CTA HEAD FINDINGS Anterior circulation: Atherosclerotic calcifications are present within the cavernous internal carotid arteries bilaterally without focal stenosis through the ICA termini. The A1 and M1 segments are normal. The anterior communicating artery is patent. No definite anterior communicating artery is present. MCA bifurcations are within normal limits bilaterally. The ACA and MCA branch vessels are normal. No aneurysm is present. Posterior circulation: The PICA origins are visualized and normal. The vertebrobasilar junction basilar artery normal. The superior cerebellar arteries are patent bilaterally. Both posterior cerebral arteries originate from basilar tip. The PCA branch vessels are normal bilaterally. No aneurysm is present. Venous sinuses: The dural sinuses are patent. The  straight sinus and deep cerebral veins are intact. Cortical veins are within normal limits. No significant vascular malformation is evident. Anatomic variants: None Review of the MIP images confirms the above findings IMPRESSION: 1. No emergent large vessel occlusion. 2. Minimal atherosclerotic changes at the carotid bifurcations and cavernous internal carotid arteries bilaterally without significant stenosis. 3. Mild tortuosity of the cervical internal carotid arteries bilaterally likely reflects chronic hypertension. 4. Mild degenerative changes of the cervical spine. 5.  Aortic Atherosclerosis (ICD10-I70.0). Electronically Signed   By: Lonni Necessary M.D.   On: 05/08/2023 18:20   CT Head Wo Contrast Result Date: 05/08/2023 CLINICAL DATA:  Head trauma, minor (Age >= 65y); Neck trauma (Age >= 65y) EXAM: CT HEAD WITHOUT CONTRAST CT CERVICAL SPINE WITHOUT CONTRAST TECHNIQUE: Multidetector CT imaging of the head and cervical spine was performed following the standard protocol without intravenous contrast. Multiplanar CT image reconstructions of the cervical spine were also generated. RADIATION DOSE REDUCTION: This exam was performed according to the departmental dose-optimization program which includes automated exposure control, adjustment of the mA and/or kV according to patient size and/or use of iterative reconstruction technique. COMPARISON:  None Available. FINDINGS: CT HEAD FINDINGS Brain: No hemorrhage. No hydrocephalus. No extra-axial fluid collection. 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 No mass effect. No mass lesion. Vascular: No hyperdense vessel or unexpected calcification. Skull: Normal. Negative for fracture or focal lesion. Sinuses/Orbits: No middle ear or mastoid effusion. Paranasal sinuses notable  for mucosal thickening in the right sphenoid sinus. Orbits are unremarkable. Other: None. CT CERVICAL SPINE FINDINGS Alignment: Normal. Skull base  and vertebrae: No acute fracture. No primary bone lesion or focal pathologic process. Soft tissues and spinal canal: No prevertebral fluid or swelling. No visible canal hematoma. Disc levels:  No evidence of high-grade stenosis. Upper chest: Negative. Other: None IMPRESSION: 1. 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 Recommend brain MRI for further evaluation. 2. No CT evidence of intracranial injury 3. No acute fracture or traumatic subluxation of the cervical spine. Electronically Signed   By: Lyndall Gore M.D.   On: 05/08/2023 13:11   CT Cervical Spine Wo Contrast Result Date: 05/08/2023 CLINICAL DATA:  Head trauma, minor (Age >= 65y); Neck trauma (Age >= 65y) EXAM: CT HEAD WITHOUT CONTRAST CT CERVICAL SPINE WITHOUT CONTRAST TECHNIQUE: Multidetector CT imaging of the head and cervical spine was performed following the standard protocol without intravenous contrast. Multiplanar CT image reconstructions of the cervical spine were also generated. RADIATION DOSE REDUCTION: This exam was performed according to the departmental dose-optimization program which includes automated exposure control, adjustment of the mA and/or kV according to patient size and/or use of iterative reconstruction technique. COMPARISON:  None Available. FINDINGS: CT HEAD FINDINGS Brain: No hemorrhage. No hydrocephalus. No extra-axial fluid collection. 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 No mass effect. No mass lesion. Vascular: No hyperdense vessel or unexpected calcification. Skull: Normal. Negative for fracture or focal lesion. Sinuses/Orbits: No middle ear or mastoid effusion. Paranasal sinuses notable for mucosal thickening in the right sphenoid sinus. Orbits are unremarkable. Other: None. CT CERVICAL SPINE FINDINGS Alignment: Normal. Skull base and vertebrae: No acute fracture. No primary bone lesion or focal pathologic process. Soft tissues and spinal canal: No  prevertebral fluid or swelling. No visible canal hematoma. Disc levels:  No evidence of high-grade stenosis. Upper chest: Negative. Other: None IMPRESSION: 1. 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 Recommend brain MRI for further evaluation. 2. No CT evidence of intracranial injury 3. No acute fracture or traumatic subluxation of the cervical spine. Electronically Signed   By: Lyndall Gore M.D.   On: 05/08/2023 13:11    Microbiology: No results found for this or any previous visit (from the past 240 hours).   Labs: CBC: Recent Labs  Lab 05/08/23 0926 05/11/23 1120  WBC 6.9 5.3  HGB 11.8* 11.0*  HCT 36.9 32.9*  MCV 99.5 96.5  PLT 402* 319   Basic Metabolic Panel: Recent Labs  Lab 05/08/23 0926 05/08/23 1730 05/08/23 2251 05/09/23 0254 05/11/23 1120  NA 125* 142  142 138 137 141  K 4.8  --   --   --  4.3  CL 94*  --   --   --  111  CO2 16*  --   --   --  21*  GLUCOSE 72  --   --   --  82  BUN 18  --   --   --  13  CREATININE 0.85  --   --   --  0.83  CALCIUM  7.1*  --   --   --  8.3*  MG  --   --   --   --  1.8  PHOS  --   --   --   --  3.4   Liver Function Tests: Recent Labs  Lab 05/08/23 1730  AST 13*  ALT 9  ALKPHOS 90  BILITOT 0.4  PROT 4.5*  ALBUMIN 2.2*   No results for input(s): LIPASE, AMYLASE in the last 168 hours. No results for input(s): AMMONIA in the last 168 hours. Cardiac Enzymes: No results for input(s): CKTOTAL, CKMB, CKMBINDEX, TROPONINI in the last 168 hours. BNP (last 3 results) No results for input(s): BNP in the last 8760 hours. CBG: Recent Labs  Lab 05/11/23 0431  GLUCAP 79    Time spent: 35 minutes  Signed:  Elvan Sor  [facility] 05/14/2023 11:59 AM

## 2023-05-14 NOTE — H&P (Addendum)
 Physical Medicine and Rehabilitation Admission H&P        Chief Complaint  Patient presents with   Weakness  : HPI: Christine Reyes is a 68 year old right-handed female with history significant for anxiety, hypertension, iron  deficiency anemia, obesity status post gastric bypass with BMI 39.77, quit smoking 7 years ago, right TKA 2019 complicated by DVT.  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  1 level home 3 steps to entry.  Independent prior to admission.  There is a daughter as well as a granddaughter with good support.  Presented to [facility] 05/08/2023 after reported fall with altered mental status/headache, generalized weakness as well as blurred vision.  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  CT cervical spine negative.  CTA of head and neck with no emergent large vessel occlusion.  MRI showed acute infarct in the bilateral occipital lobes, left greater than right.  The bilateral occipital infarcts with foci of hemosiderin deposition possibly representing thromboemboli.  Additional punctate acute infarct in the right cerebellum and possible acute infarct in the right lateral pons.  Patient did not receive tPA.  Bilateral lower extremity ultrasound showed no evidence of DVT in either lower extremity and resolution of left posterior tibial vein DVT.  Admission chemistries unremarkable except sodium of 125, CO2 of 16, osmolality 273, urine osmolality 686, free T4 0.56, TSH 4.077.  Echocardiogram with ejection fraction of 55 to 60% no wall motion abnormalities grade 1 diastolic dysfunction.  Neurology follow-up placed on aspirin  81 mg daily and Plavix  75 mg daily for CVA prophylaxis x 3 weeks then aspirin  alone.  Lovenox  added for DVT prophylaxis.  Hyponatremia resolved after initial fluid restriction latest sodium of 141.  Tolerating a regular consistency diet.  Therapy evaluations completed due to patient's  decreased functional mobility was admitted for a comprehensive rehab program.   Review of Systems  Constitutional:  Negative for chills and fever.  HENT:  Negative for hearing loss.   Eyes:  Positive for blurred vision.  Respiratory:  Negative for cough, shortness of breath and wheezing.   Cardiovascular:  Negative for chest pain, palpitations and leg swelling.  Gastrointestinal:  Positive for constipation. Negative for heartburn, nausea and vomiting.       GERD  Genitourinary:  Negative for dysuria, flank pain and hematuria.  Musculoskeletal:  Positive for falls, joint pain and myalgias.  Skin:  Negative for rash.  Neurological:  Positive for weakness and headaches.  Psychiatric/Behavioral:  The patient has insomnia.        Anxiety  All other systems reviewed and are negative.       Past Medical History:  Diagnosis Date   Anxiety     Arthritis     Headache     Hypertension     Iron  deficiency anemia 06/17/2018   Obesity     Sleep apnea               Past Surgical History:  Procedure Laterality Date   ABDOMINAL HYSTERECTOMY       BREAST EXCISIONAL BIOPSY Right 1989   CHOLECYSTECTOMY       COLONOSCOPY WITH ESOPHAGOGASTRODUODENOSCOPY (EGD)       csection x2       DILATION AND CURETTAGE OF UTERUS       ESOPHAGOGASTRODUODENOSCOPY (EGD) WITH PROPOFOL  N/A 12/14/2015    Procedure: ESOPHAGOGASTRODUODENOSCOPY (EGD) WITH PROPOFOL ;  Surgeon: Lamar ONEIDA Holmes, MD;  Location:  [facility] ENDOSCOPY;  Service: Endoscopy;  Laterality: N/A;   GASTRIC BYPASS       JOINT REPLACEMENT       left knee surgery       TOTAL KNEE ARTHROPLASTY Right 06/22/2017    Procedure: TOTAL KNEE ARTHROPLASTY;  Surgeon: Leora Lynwood SAUNDERS, MD;  Location: [facility] ORS;  Service: Orthopedics;  Laterality: Right;             Family History  Problem Relation Age of Onset   Hypertension Mother     Arthritis Mother     Cancer Father     Bipolar disorder Daughter     Breast cancer Neg Hx          Social History:   reports that she quit smoking about 7 years ago. Her smoking use included cigarettes. She has quit using smokeless tobacco. She reports current alcohol use of about 2.0 standard drinks of alcohol per week. She reports that she does not use drugs. Allergies:  Allergies      Allergies  Allergen Reactions   Morphine  And Codeine Itching   Tramadol Itching            Medications Prior to Admission  Medication Sig Dispense Refill   Aspirin -Salicylamide-Caffeine  (ARTHRITIS STRENGTH BC POWDER PO) Take by mouth.       gabapentin  (NEURONTIN ) 300 MG capsule Take 1 capsule (300 mg total) by mouth 2 (two) times daily. 60 capsule 2   Iron -Vitamin C  65-125 MG TABS Take 1 tablet by mouth daily. 30 tablet 3   naloxone (NARCAN) 4 MG/0.1ML LIQD nasal spray kit Inhale 4 mg into the lungs as needed.       olmesartan -hydrochlorothiazide  (BENICAR  HCT) 40-25 MG tablet Take 1 tablet by mouth daily.   5   ondansetron  (ZOFRAN -ODT) 4 MG disintegrating tablet Take 1 tablet (4 mg total) by mouth every 8 (eight) hours as needed for nausea or vomiting. 20 tablet 0   Oxycodone  HCl 20 MG TABS Take 20 mg by mouth 2 (two) times daily as needed.       pantoprazole  (PROTONIX ) 40 MG tablet Take 40 mg by mouth 2 (two) times daily as needed (heartburn).        dicyclomine  (BENTYL ) 20 MG tablet Take 1 tablet (20 mg total) by mouth 4 (four) times daily -  before meals and at bedtime for 7 days. (Patient not taking: Reported on 05/08/2023) 28 tablet 0   ibuprofen  (ADVIL ) 600 MG tablet Take 1 tablet (600 mg total) by mouth every 8 (eight) hours as needed for moderate pain. (Patient not taking: Reported on 05/08/2023) 20 tablet 0   Oxycodone  HCl 20 MG TABS Take by mouth. (Patient not taking: Reported on 02/19/2021)                  Home: Home Living Family/patient expects to be discharged to:: Private residence Living Arrangements: Parent Available Help at Discharge: Family, Available PRN/intermittently Type of Home:  House Home Access: Ramped entrance Entrance Stairs-Number of Steps: 3 Entrance Stairs-Rails: Can reach both Home Layout: One level Bathroom Shower/Tub: Engineer, Manufacturing Systems: Handicapped height Bathroom Accessibility: Yes Home Equipment: Grab bars - toilet  Lives With: Other (Comment) (mother. Mother will be going to live with sister soon)   Functional History: Prior Function Prior Level of Function : Independent/Modified Independent, History of Falls (last six months) Mobility Comments: Ambulatory with no AD. 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   Functional Status:  Mobility: Bed Mobility Overal bed mobility: Needs Assistance Bed Mobility: Sit to Supine Supine to sit: Supervision, Used rails, HOB elevated Sit to supine: Modified independent (Device/Increase time), HOB elevated General bed mobility comments: increased time and effort to reach EOB today-possibly due to pain Transfers Overall transfer level: Needs assistance Equipment used: Rolling walker (2 wheels) Transfers: Sit to/from Stand Sit to Stand: Min assist Bed to/from chair/wheelchair/BSC transfer type:: Step pivot Step pivot transfers: Min assist General transfer comment: assist to steady standing with RW use Ambulation/Gait Ambulation/Gait assistance: Contact guard assist, Min assist Gait Distance (Feet): 100 Feet Assistive device: Rolling walker (2 wheels) (YOUTH SIZE) Gait Pattern/deviations: Decreased step length - right, Decreased step length - left General Gait Details: mild decreased stance time L LE; assist to steady intermittently with fatigue; R hip drop during R LE advancement; 2 mild loss of balance requiring min assist to steady; more purposeful/effortful steps noted Gait velocity: decreased/slower gait speed today   ADL: ADL Overall ADL's : Needs assistance/impaired Eating/Feeding: Supervision/ safety,  Sitting Grooming: Wash/dry face, Sitting, Set up, Wash/dry hands, Standing, Contact guard assist Upper Body Bathing: Supervision/ safety, Sitting Lower Body Bathing: Sit to/from stand, Minimal assistance Lower Body Bathing Details (indicate cue type and reason): Min A to bathe bil feet d/t pt unable to reach Lower Body Dressing: Maximal assistance, Sitting/lateral leans Lower Body Dressing Details (indicate cue type and reason): to doff/don socks seated in recliner Toilet Transfer: Regular Toilet, Contact guard assist, Grab bars Toilet Transfer Details (indicate cue type and reason): CGA for toilet transfer using grab bar and RW Toileting- Clothing Manipulation and Hygiene: Contact guard assist, Sitting/lateral lean Toileting - Clothing Manipulation Details (indicate cue type and reason): CGA for hygiene on toilet Functional mobility during ADLs:  (approx 6' in room with MIN-MOD A with HHA, pt reports feeling dizzy therefore returned to seated on edge of bed)   Cognition: Cognition Overall Cognitive Status: No family/caregiver present to determine baseline cognitive functioning Arousal/Alertness: Awake/alert Orientation Level: Oriented X4 Memory: Impaired Memory Impairment: Retrieval deficit Awareness: Appears intact Problem Solving: Appears intact Executive Function: Organizing, Reasoning Reasoning: Appears intact Organizing: Impaired Organizing Impairment: Functional complex, Verbal complex Safety/Judgment: Appears intact Cognition Arousal: Alert Behavior During Therapy: WFL for tasks assessed/performed Overall Cognitive Status: No family/caregiver present to determine baseline cognitive functioning Area of Impairment: Safety/judgement, Following commands Following Commands: Follows multi-step commands with increased time Safety/Judgement: Decreased awareness of deficits Awareness: Emergent Problem Solving: Requires verbal cues, Slow processing General Comments: Pleasant,  cooperative   Physical Exam: Blood pressure 101/69, pulse 79, temperature 98.4 F (36.9 C), temperature source Oral, resp. rate 18, height 4' 9 (1.448 m), weight 83.4 kg, SpO2 92%. Physical Exam Constitutional: No apparent distress. Appropriate appearance for age. +obese.  HENT: No JVD. Neck Supple. Trachea midline. Atraumatic, normocephalic. Eyes: PERRLA. EOMI. Visual fields grossly intact.  Cardiovascular: RRR, no murmurs/rub/gallops. No Edema. Peripheral pulses 2+  Respiratory: CTAB. No rales, rhonchi, or wheezing. On RA.  Abdomen: + bowel sounds, normoactive. No distention or tenderness.  Skin: C/D/I. No apparent lesions. MSK:      No apparent deformity.  Full passive range of motion in all 4 extremities.  + Tenderness with range of motion, palpation in L>R knee and L shoulder       Neurologic exam:  Cognition: AAO to person, place, time and event.  Language: Fluent, No substitutions or neoglisms. Moderate dysarthria. Names 3/3 objects correctly.  Memory: Recalls 2/3 objects at 5 minutes.  Insight: Fair insight into current condition.  Mood: Pleasant affect, appropriate mood.  Sensation: To light touch intact in BL UEs and LEs  Reflexes: 2+ in BL UE and LEs. Negative Hoffman's and babinski signs bilaterally.  CN: 2-12 grossly intact.  Coordination: Mild left upper extremity ataxia with finger-to-nose; bilateral upper and lower extremity fine tremors with mobilization, along with giveway weakness with strength testing.  Spasticity: MAS 0 in all extremities.  Strength:                RUE: 2/5 SA, 3/5 EF, 3/5 EE, 4/5 WE, 4/5 FF, 4/5 FA                LUE:  2/5 SA, 3/5 EF, 3/5 EE, 4/5 WE, 4/5 FF, 4/5 FA                RLE: 2/5 HF, 4/5 KE, 4/5  DF, 4/5  EHL, 4/5  PF                 LLE:  1/5 HF, 3/5 KE, 4/5  DF, 4/5  EHL, 4/5  PF          Lab Results Last 48 Hours  No results found for this or any previous visit (from the past 48 hours).     Imaging Results (Last 48 hours)   No results found.           Blood pressure 101/69, pulse 79, temperature 98.4 F (36.9 C), temperature source Oral, resp. rate 18, height 4' 9 (1.448 m), weight 83.4 kg, SpO2 92%.   Medical Problem List and Plan: 1. 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  Antithrombotics: -DVT/anticoagulation:  Pharmaceutical: Lovenox              -antiplatelet therapy: Aspirin  81 mg daily and Plavix  75 mg day x 3 weeks then aspirin  alone 3. Pain Management: Neurontin  300 mg 3 times daily, baclofen  5 mg 3 times daily muscle spasms, Voltaren  4 g 4 times daily as needed, oxycodone  5 mg every 6 hours as needed 4. Mood/Behavior/Sleep: Trazodone  100 mg nightly.  Provide emotional support             -antipsychotic agents: N/A 5. Neuropsych/cognition: This patient is not capable of making decisions on her own behalf. 6. Skin/Wound Care: Routine skin checks 7. Fluids/Electrolytes/Nutrition: Routine in and outs with follow-up chemistries 8.  Hyponatremia.  Follow-up chemistries 9.  Hyperlipidemia.  Lipitor 10.  Obesity.  History of gastric bypass.  BMI 39.77.  Dietary follow-up 11.  GERD.  Protonix  12.  History of iron  deficiency anemia.  Follow-up chemistries 13.  Permissive hypertension.  Patient on Benicar  HCT 40-25 mg tablet daily prior to admission.  Resume as needed 14. Obesity class 2. Complicates all aspects of care. Provide support and education.    Toribio PARAS Angiulli, PA-C 05/14/2023  I have examined the patient independently and edited the note for HPI, ROS, exam, assessment, and plan as appropriate. I am in agreement with the above recommendations.   Joesph JAYSON Likes, DO 05/14/2023

## 2023-05-15 DIAGNOSIS — I10 Essential (primary) hypertension: Secondary | ICD-10-CM

## 2023-05-15 DIAGNOSIS — D508 Other iron deficiency anemias: Secondary | ICD-10-CM | POA: Diagnosis not present

## 2023-05-15 DIAGNOSIS — G894 Chronic pain syndrome: Secondary | ICD-10-CM | POA: Diagnosis not present

## 2023-05-15 DIAGNOSIS — I6350 Cerebral infarction due to unspecified occlusion or stenosis of unspecified cerebral artery: Secondary | ICD-10-CM | POA: Diagnosis not present

## 2023-05-15 MED ORDER — FERROUS SULFATE 325 (65 FE) MG PO TABS
325.0000 mg | ORAL_TABLET | Freq: Every day | ORAL | Status: DC
  Administered 2023-05-15 – 2023-05-26 (×12): 325 mg via ORAL
  Filled 2023-05-15 (×12): qty 1

## 2023-05-15 MED ORDER — ACETAMINOPHEN 325 MG PO TABS
650.0000 mg | ORAL_TABLET | Freq: Three times a day (TID) | ORAL | Status: DC
  Administered 2023-05-15 – 2023-05-18 (×10): 650 mg via ORAL
  Filled 2023-05-15 (×17): qty 2

## 2023-05-15 NOTE — Evaluation (Addendum)
 Physical Therapy Assessment and Plan  Patient Details  Name: Christine Reyes MRN: [id number] Date of Birth: 05-19-1955  PT Diagnosis: Difficulty walking, Hemiparesis non-dominant, Low back pain, and Muscle weakness Rehab Potential: Good ELOS: 10-14 days   Today's Date: 05/15/2023 PT Individual Time: 8697-8583 PT Individual Time Calculation (min): 74 min    Hospital Problem: Principal Problem:   Posterior circulation stroke [facility])   Past Medical History:  Past Medical History:  Diagnosis Date   Anxiety    Arthritis    Headache    Hypertension    Iron  deficiency anemia 06/17/2018   Obesity    Sleep apnea    Past Surgical History:  Past Surgical History:  Procedure Laterality Date   ABDOMINAL HYSTERECTOMY     BREAST EXCISIONAL BIOPSY Right 1989   CHOLECYSTECTOMY     COLONOSCOPY WITH ESOPHAGOGASTRODUODENOSCOPY (EGD)     csection x2     DILATION AND CURETTAGE OF UTERUS     ESOPHAGOGASTRODUODENOSCOPY (EGD) WITH PROPOFOL  N/A 12/14/2015   Procedure: ESOPHAGOGASTRODUODENOSCOPY (EGD) WITH PROPOFOL ;  Surgeon: Lamar ONEIDA Holmes, MD;  Location: [facility] ENDOSCOPY;  Service: Endoscopy;  Laterality: N/A;   GASTRIC BYPASS     JOINT REPLACEMENT     left knee surgery     TOTAL KNEE ARTHROPLASTY Right 06/22/2017   Procedure: TOTAL KNEE ARTHROPLASTY;  Surgeon: Leora Lynwood JONELLE, MD;  Location: [facility] ORS;  Service: Orthopedics;  Laterality: Right;    Assessment & Plan Clinical Impression: Patient is a 68 y.o. yright-handed female with history significant for anxiety, hypertension, iron  deficiency anemia, obesity status post gastric bypass with BMI 39.77, quit smoking 7 years ago, right TKA 2019 complicated by DVT. 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 1 level home 3 steps to entry. Independent prior to admission. There is a daughter as well as a granddaughter with good support. [facility]ed to [facility]/20[facility] after reported fall with altered mental status/headache,  generalized weakness as well as blurred vision. 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 CT cervical spine negative. CTA of head and neck with no emergent large vessel occlusion. MRI showed acute infarct in the bilateral occipital lobes, left greater than right. The bilateral occipital infarcts with foci of hemosiderin deposition possibly representing thromboemboli. Additional punctate acute infarct in the right cerebellum and possible acute infarct in the right lateral pons. Patient did not receive tPA. Bilateral lower extremity ultrasound showed no evidence of DVT in either lower extremity and resolution of left posterior tibial vein DVT. Admission chemistries unremarkable except sodium of 125, CO2 of 16, osmolality 273, urine osmolality 686, free T4 0.56, TSH 4.077. Echocardiogram with ejection fraction of 55 to 60% no wall motion abnormalities grade 1 diastolic dysfunction. Neurology follow-up placed on aspirin  81 mg daily and Plavix  75 mg daily for CVA prophylaxis x 3 weeks then aspirin  alone. Lovenox  added for DVT prophylaxis. Hyponatremia resolved after initial fluid restriction latest sodium of 141. Tolerating a regular consistency diet. Therapy evaluations completed due to patient's decreased functional mobility was admitted for a comprehensive rehab program. Patient transferred to CIR on 05/14/2023.   Patient currently requires light min assist with mobility secondary to muscle weakness, decreased cardiorespiratoy endurance, unbalanced muscle activation, decreased attention to left, decreased problem solving and decreased safety awareness, and decreased standing balance, hemiplegia, and decreased balance strategies.  Prior to hospitalization, patient was independent  with mobility and lived with Alone in a House home.  Home access  is 3Ramped entrance, Stairs to enter.  Patient will benefit from skilled PT intervention to maximize safe functional mobility,  minimize fall risk, and decrease caregiver burden for planned discharge home with intermittent assist.  Anticipate patient will benefit from follow up OP at discharge.  PT - End of Session Activity Tolerance: Tolerates 30+ min activity with multiple rests Endurance Deficit: Yes PT Assessment Rehab Potential (ACUTE/IP ONLY): Good PT Barriers to Discharge: Inaccessible home environment;Decreased caregiver support;Lack of/limited family support;Insurance for SNF coverage;Weight PT Patient demonstrates impairments in the following area(s): Balance;Endurance;Motor;Nutrition;Pain;Perception;Safety;Sensory PT Transfers Functional Problem(s): Bed Mobility;Bed to Chair;Car;Furniture PT Locomotion Functional Problem(s): Ambulation;Stairs PT Plan PT Intensity: Minimum of 1-2 x/day ,45 to 90 minutes PT Frequency: 5 out of 7 days PT Duration Estimated Length of Stay: 10-14 days PT Treatment/Interventions: Ambulation/gait training;Community reintegration;DME/adaptive equipment instruction;Neuromuscular re-education;Psychosocial support;Stair training;UE/LE Strength taining/ROM;Wheelchair propulsion/positioning;UE/LE Coordination activities;Therapeutic Activities;Skin care/wound management;Pain management;Discharge planning;Balance/vestibular training;Cognitive remediation/compensation;Disease management/prevention;Functional mobility training;Patient/family education;Splinting/orthotics;Therapeutic Exercise;Visual/perceptual remediation/compensation PT Recommendation Follow Up Recommendations: Outpatient PT;24 hour supervision/assistance Patient destination: Home Equipment Recommended: To be determined   PT Evaluation Precautions/Restrictions Precautions Precautions: Fall Precaution Comments: L hemipareisis, intermittent LLE pain with open-chain movement Restrictions Weight Bearing Restrictions Per Provider Order: No General   Vital Signs  Pain Pain Assessment Pain Scale: 0-10 Pain Score: 5   Pain Type: Acute pain;Neuropathic pain Pain Location: Leg Pain Orientation: Left Pain Radiating Towards: shoulder Pain Descriptors / Indicators: Aching;Discomfort Pain Frequency: Constant Pain Onset: With Activity Patients Stated Pain Goal: 0 Pain Intervention(s): Medication (See eMAR);Ambulation/increased activity;Repositioned Multiple Pain Sites: No Pain Interference Pain Interference Pain Effect on Sleep: 2. Occasionally Pain Interference with Therapy Activities: 2. Occasionally Pain Interference with Day-to-Day Activities: 2. Occasionally Home Living/Prior Functioning Home Living Available Help at Discharge: Family;Available PRN/intermittently Type of Home: House Home Access: Ramped entrance;Stairs to enter Entrance Stairs-Number of Steps: 3 Entrance Stairs-Rails: Can reach both Home Layout: One level Bathroom Shower/Tub: Nurse, Adult Accessibility: Yes  Lives With: Alone Prior Function Level of Independence: Independent with basic ADLs;Independent with homemaking with ambulation;Independent with gait;Independent with transfers  Able to Take Stairs?: Yes Driving: Yes Vision/Perception  Vision - History Ability to See in Adequate Light: 1 Impaired Vision - Assessment Eye Alignment: Within Functional Limits Ocular Range of Motion: Within Functional Limits Alignment/Gaze Preference: Within Defined Limits Tracking/Visual Pursuits: Able to track stimulus in all quads without difficulty Perception Perception: Within Functional Limits Praxis Praxis: WFL  Cognition Overall Cognitive Status: Within Functional Limits for tasks assessed Arousal/Alertness: Awake/alert Orientation Level: Oriented X4 Awareness: Appears intact Problem Solving: Appears intact Safety/Judgment: Appears intact Sensation Sensation Light Touch: Appears Intact Additional Comments: suspected neuropathy in toes and fingers pta Coordination Gross Motor Movements are Fluid and  Coordinated: No Fine Motor Movements are Fluid and Coordinated: No Coordination and Movement Description: slow and delayed on L side Motor  Motor Motor: Hemiplegia Motor - Skilled Clinical Observations: left side weakness   Trunk/Postural Assessment  Cervical Assessment Cervical Assessment: Within Functional Limits Thoracic Assessment Thoracic Assessment: Exceptions to [facility] (rounded shoulders) Lumbar Assessment Lumbar Assessment: Exceptions to [facility] (increased lordosis with anterior pelvic tilt) Postural Control Postural Control: Within Functional Limits  Balance Static Sitting Balance Static Sitting - Level of Assistance: 5: Stand by assistance Dynamic Sitting Balance Dynamic Sitting - Level of Assistance: 5: Stand by assistance Static Standing Balance Static Standing - Balance Support: During functional activity;Bilateral upper extremity supported Static Standing - Level of Assistance: Other (comment) (CGA) Dynamic Standing Balance Dynamic Standing - Balance Support: Bilateral upper extremity supported;During functional activity  Dynamic Standing - Level of Assistance: 4: Min assist;Other (comment) (CGA) Extremity Assessment      RLE Assessment RLE Assessment: Exceptions to [facility] RLE Strength RLE Overall Strength: Deficits Right Hip Flexion: 3/5 Right Hip Extension: 3+/5 Right Hip ABduction: 3+/5 Right Hip ADduction: 3+/5 Right Knee Flexion: 3/5 Right Knee Extension: 4-/5 Right Ankle Dorsiflexion: 4/5 Right Ankle Plantar Flexion: 4/5 LLE Assessment LLE Assessment: Exceptions to WFL LLE Strength LLE Overall Strength: Deficits Left Hip Flexion: 3-/5 Left Hip Extension: 3-/5 Left Hip ABduction: 3/5 Left Hip ADduction: 3+/5 Left Knee Flexion: 3-/5 (pain indicated along sciatic distribution) Left Knee Extension: 3-/5 Left Ankle Dorsiflexion: 3-/5 Left Ankle Plantar Flexion: 3/5  Care Tool Care Tool Bed Mobility Roll left and right activity   Roll left and right  assist level: Supervision/Verbal cueing    Sit to lying activity   Sit to lying assist level: Supervision/Verbal cueing    Lying to sitting on side of bed activity   Lying to sitting on side of bed assist level: the ability to move from lying on the back to sitting on the side of the bed with no back support.: Supervision/Verbal cueing     Care Tool Transfers Sit to stand transfer   Sit to stand assist level: Contact Guard/Touching assist    Chair/bed transfer   Chair/bed transfer assist level: Minimal Assistance - Patient > 75%    Car transfer   Car transfer assist level: Contact Guard/Touching assist;Minimal Assistance - Patient > 75%      Care Tool Locomotion Ambulation   Assist level: Minimal Assistance - Patient > 75% Assistive device: Walker-rolling Max distance: 75 ft  Walk 10 feet activity   Assist level: Contact Guard/Touching assist Assistive device: Walker-rolling   Walk 50 feet with 2 turns activity   Assist level: Minimal Assistance - Patient > 75% Assistive device: Walker-rolling  Walk 150 feet activity Walk 150 feet activity did not occur: Safety/medical concerns      Walk 10 feet on uneven surfaces activity Walk 10 feet on uneven surfaces activity did not occur: Safety/medical concerns      Stairs   Assist level: Minimal Assistance - Patient > 75% Stairs assistive device: 2 hand rails Max number of stairs: 4  Walk up/down 1 step activity   Walk up/down 1 step (curb) assist level: Contact Guard/Touching assist Walk up/down 1 step or curb assistive device: 2 hand rails  Walk up/down 4 steps activity   Walk up/down 4 steps assist level: Minimal Assistance - Patient > 75% Walk up/down 4 steps assistive device: 2 hand rails  Walk up/down 12 steps activity Walk up/down 12 steps activity did not occur: Safety/medical concerns      Pick up small objects from floor   Pick up small object from the floor assist level: Minimal Assistance - Patient > 75%     Wheelchair Is the patient using a wheelchair?: Yes Type of Wheelchair: Manual   Wheelchair assist level: Dependent - Patient 0% Max wheelchair distance: 300 ft  Wheel 50 feet with 2 turns activity   Assist Level: Dependent - Patient 0%  Wheel 150 feet activity   Assist Level: Dependent - Patient 0%    Refer to Care Plan for Long Term Goals  SHORT TERM GOAL WEEK 1 PT Short Term Goal 1 (Week 1): Pt will perform all bed mobility with Mod I from flat mattress. PT Short Term Goal 2 (Week 1): Pt will perform all functional transfers with close supervision. PT Short Term  Goal 3 (Week 1): Pt will ambulate at least 130 ft using RW with close supervision. PT Short Term Goal 4 (Week 1): Pt will perform at least 8 steps with close supervision.  Recommendations for other services: Adult Nurse group, Stress management, and Outing/community reintegration  Skilled Therapeutic Intervention Mobility Bed Mobility Bed Mobility: Supine to Sit;Sit to Supine Supine to Sit: Contact Guard/Touching assist Sit to Supine: Contact Guard/Touching assist Transfers Transfers: Stand Pivot Transfers;Sit to Stand;Stand to Sit Sit to Stand: Contact Guard/Touching assist Stand to Sit: Contact Guard/Touching assist Stand Pivot Transfers: Minimal Assistance - Patient > 75% Stand Pivot Transfer Details: Visual cues for safe use of DME/AE;Verbal cues for technique;Verbal cues for precautions/safety Transfer (Assistive device): Rolling walker Locomotion  Gait Ambulation: Yes Gait Assistance: Contact Guard/Touching assist;Minimal Assistance - Patient > 75% Gait Distance (Feet): 75 Feet Assistive device: Rolling walker Gait Assistance Details: Verbal cues for precautions/safety;Verbal cues for safe use of DME/AE;Verbal cues for gait pattern Gait Gait: Yes Gait Pattern: Impaired Gait Pattern: Decreased stance time - left;Decreased hip/knee flexion - left;Decreased dorsiflexion - left Gait  velocity: decreased Stairs / Additional Locomotion Stairs: Yes Stairs Assistance: Contact Guard/Touching assist Stair Management Technique: Two rails Number of Stairs: 4 Height of Stairs: 6 Wheelchair Mobility Wheelchair Mobility: No  Skilled Intervention: PT Evaluation completed; see above for results. PT educated patient in roles of PT vs OT, PT POC, rehab potential, rehab goals, and discharge recommendations along with recommendation for follow-up rehabilitation services. Individual treatment initiated:  Patient seated upright in room with dtr and granddtr present with newborn in room. Patient alert and agreeable to PT session. No pain complaint at start of session.  Therapeutic Activity: Bed Mobility: Patient performed supine <> sit with supervision. Increased effort and time needed with use of bedrails to complete.  Transfers: Patient performed sit<>stand transfers with CGA/ MinA. Stand pivot transfers with MinA for safety and RW mgmt. Provided min vc/tc for technique.  Car transfer performed with CGA and minimal cues for technique.   Gait Training:  Patient ambulated >75 feet using RW with CGA/ MinA. Demonstrated need for increased focus to LLE for improved quality of gait. No buckling noted.   Stair navigation performed after verbal instructions and visual demonstration for leading with RLE to ascend. Pt able to return demo with CGA/ MinA to ascend with BHR. To descend, pt educated re: leading with LLE and is able to return demo with light CGA and guard to L knee.  Patient supine in bed at end of session with brakes locked, bed alarm set, and all needs within reach. Pt relates need to toilet. NT notified and able to assist.     Discharge Criteria: Patient will be discharged from PT if patient refuses treatment 3 consecutive times without medical reason, if treatment goals not met, if there is a change in medical status, if patient makes no progress towards goals or if patient is  discharged from hospital.  The above assessment, treatment plan, treatment alternatives and goals were discussed and mutually agreed upon: by patient  Christine Reyes PT, DPT, CSRS 05/15/2023, 5:10 PM

## 2023-05-15 NOTE — Plan of Care (Signed)
  Problem: RH Balance Goal: LTG Patient will maintain dynamic standing with ADLs (OT) Description: LTG:  Patient will maintain dynamic standing balance with assist during activities of daily living (OT)  Flowsheets (Taken 05/15/2023 1211) LTG: Pt will maintain dynamic standing balance during ADLs with: Independent with assistive device   Problem: Sit to Stand Goal: LTG:  Patient will perform sit to stand in prep for activites of daily living with assistance level (OT) Description: LTG:  Patient will perform sit to stand in prep for activites of daily living with assistance level (OT) Flowsheets (Taken 05/15/2023 1211) LTG: PT will perform sit to stand in prep for activites of daily living with assistance level: Independent with assistive device   Problem: RH Bathing Goal: LTG Patient will bathe all body parts with assist levels (OT) Description: LTG: Patient will bathe all body parts with assist levels (OT) Flowsheets (Taken 05/15/2023 1211) LTG: Pt will perform bathing with assistance level/cueing: Set up assist    Problem: RH Dressing Goal: LTG Patient will perform upper body dressing (OT) Description: LTG Patient will perform upper body dressing with assist, with/without cues (OT). Flowsheets (Taken 05/15/2023 1211) LTG: Pt will perform upper body dressing with assistance level of: Independent Goal: LTG Patient will perform lower body dressing w/assist (OT) Description: LTG: Patient will perform lower body dressing with assist, with/without cues in positioning using equipment (OT) Flowsheets (Taken 05/15/2023 1211) LTG: Pt will perform lower body dressing with assistance level of: Independent with assistive device   Problem: RH Toileting Goal: LTG Patient will perform toileting task (3/3 steps) with assistance level (OT) Description: LTG: Patient will perform toileting task (3/3 steps) with assistance level (OT)  Flowsheets (Taken 05/15/2023 1211) LTG: Pt will perform toileting task (3/3  steps) with assistance level: Independent with assistive device   Problem: RH Toilet Transfers Goal: LTG Patient will perform toilet transfers w/assist (OT) Description: LTG: Patient will perform toilet transfers with assist, with/without cues using equipment (OT) Flowsheets (Taken 05/15/2023 1211) LTG: Pt will perform toilet transfers with assistance level of: Independent with assistive device   Problem: RH Tub/Shower Transfers Goal: LTG Patient will perform tub/shower transfers w/assist (OT) Description: LTG: Patient will perform tub/shower transfers with assist, with/without cues using equipment (OT) Flowsheets (Taken 05/15/2023 1211) LTG: Pt will perform tub/shower stall transfers with assistance level of: Supervision/Verbal cueing LTG: Pt will perform tub/shower transfers from: Tub/shower combination   Problem: RH Memory Goal: LTG Patient will demonstrate ability for day to day recall/carry over during activities of daily living with assistance level (OT) Description: LTG:  Patient will demonstrate ability for day to day recall/carry over during activities of daily living with assistance level (OT). Flowsheets (Taken 05/15/2023 1211) LTG:  Patient will demonstrate ability for day to day recall/carry over during activities of daily living with assistance level (OT): Modified Independent

## 2023-05-15 NOTE — Plan of Care (Signed)
  Problem: RH Expression Communication Goal: LTG Patient will increase word finding of common (SLP) Description: LTG:  Patient will increase word finding of common objects/daily info/abstract thoughts with cues using compensatory strategies (SLP). Flowsheets (Taken 05/15/2023 1234) LTG: Patient will increase word finding of common (SLP): Supervision Patient will use compensatory strategies to increase word finding of: Daily info   Problem: RH Memory Goal: LTG Patient will use memory compensatory aids to (SLP) Description: LTG:  Patient will use memory compensatory aids to recall biographical/new, daily complex information with cues (SLP) Flowsheets (Taken 05/15/2023 1234) LTG: Patient will use memory compensatory aids to (SLP): Minimal Assistance - Patient > 75%   Problem: RH Pre-functional/Other (Specify) Goal: RH LTG SLP (Specify) 1 Description: RH LTG SLP (Specify) 1 Flowsheets (Taken 05/15/2023 1234) LTG: Other SLP (Specify) 1: Patient will recall and utilize education regarding speech intelligibility strategies during instances of dysarthria given minA

## 2023-05-15 NOTE — Discharge Summary (Signed)
 Physician Discharge Summary  Patient ID: Christine Reyes MRN: [id number] DOB/AGE: 1955/05/28 68 y.o.  Admit date: 05/14/2023 Discharge date: 05/26/2023  Discharge Diagnoses:  Principal Problem:   Posterior circulation stroke [facility]) DVT prophylaxis Hyperlipidemia Obesity GERD Iron  deficiency anemia Permissive hypertension  Discharged Condition: Stable  Significant Diagnostic Studies: DG Shoulder Right Result Date: 05/18/2023 CLINICAL DATA:  Shoulder pain EXAM: RIGHT SHOULDER - 2+ VIEW COMPARISON:  None Available. FINDINGS: Mild AC joint degenerative change. No fracture or malalignment. The right lung is clear IMPRESSION: Mild AC joint degenerative change. Electronically Signed   By: Luke Bun M.D.   On: 05/18/2023 17:06   ECHOCARDIOGRAM COMPLETE BUBBLE STUDY Result Date: 05/12/2023    ECHOCARDIOGRAM REPORT   Patient Name:   Christine Reyes Date of Exam: 05/12/2023 Medical Rec #:  [id number]       Height:       57.0 in Accession #:    [id number]      Weight:       183.8 lb Date of Birth:  July 17, 1955      BSA:          1.735 m Patient Age:    67 years        BP:           125/68 mmHg Patient Gender: F               HR:           62 bpm. Exam Location:  [facility] Procedure: 2D Echo, Cardiac Doppler, Color Doppler and Saline Contrast Bubble            Study Indications:     Stroke 434.91 / I63.9  History:         Patient has prior history of Echocardiogram examinations, most                  recent 05/11/2023.  Sonographer:     Christopher Furnace Referring Phys:  [id number] ELVAN SOR Diagnosing Phys: Redell Cave MD  Sonographer Comments: Image acquisition challenging due to respiratory motion. IMPRESSIONS  1. Left ventricular ejection fraction, by estimation, is 55 to 60%. The left ventricle has normal function. The left ventricle has no regional wall motion abnormalities. Left ventricular diastolic parameters are consistent with Grade I diastolic dysfunction (impaired relaxation).  2. Right ventricular  systolic function is normal. The right ventricular size is normal.  3. The mitral valve is normal in structure. Trivial mitral valve regurgitation.  4. The aortic valve is tricuspid. Aortic valve regurgitation is not visualized. Aortic valve sclerosis is present, with no evidence of aortic valve stenosis.  5. The inferior vena cava is normal in size with greater than 50% respiratory variability, suggesting right atrial pressure of 3 mmHg.  6. Agitated saline contrast bubble study was negative, with no evidence of any interatrial shunt. FINDINGS  Left Ventricle: Left ventricular ejection fraction, by estimation, is 55 to 60%. The left ventricle has normal function. The left ventricle has no regional wall motion abnormalities. The left ventricular internal cavity size was normal in size. There is  no left ventricular hypertrophy. Left ventricular diastolic parameters are consistent with Grade I diastolic dysfunction (impaired relaxation). Right Ventricle: The right ventricular size is normal. No increase in right ventricular wall thickness. Right ventricular systolic function is normal. Left Atrium: Left atrial size was normal in size. Right Atrium: Right atrial size was normal in size. Pericardium: There is no evidence of pericardial effusion. Mitral Valve: The mitral valve  is normal in structure. Trivial mitral valve regurgitation. MV peak gradient, 4.6 mmHg. The mean mitral valve gradient is 2.0 mmHg. Tricuspid Valve: The tricuspid valve is normal in structure. Tricuspid valve regurgitation is mild. Aortic Valve: The aortic valve is tricuspid. Aortic valve regurgitation is not visualized. Aortic valve sclerosis is present, with no evidence of aortic valve stenosis. Aortic valve mean gradient measures 2.0 mmHg. Aortic valve peak gradient measures 5.3  mmHg. Aortic valve area, by VTI measures 2.67 cm. Pulmonic Valve: The pulmonic valve was normal in structure. Pulmonic valve regurgitation is not visualized. Aorta:  The aortic root is normal in size and structure. Venous: The inferior vena cava is normal in size with greater than 50% respiratory variability, suggesting right atrial pressure of 3 mmHg. IAS/Shunts: No atrial level shunt detected by color flow Doppler. Agitated saline contrast was given intravenously to evaluate for intracardiac shunting. Agitated saline contrast bubble study was negative, with no evidence of any interatrial shunt.  LEFT VENTRICLE PLAX 2D LVIDd:         4.20 cm   Diastology LVIDs:         2.50 cm   LV e' medial:    6.74 cm/s LV PW:         0.80 cm   LV E/e' medial:  12.3 LV IVS:        1.00 cm   LV e' lateral:   6.20 cm/s LVOT diam:     2.00 cm   LV E/e' lateral: 13.4 LV SV:         59 LV SV Index:   34 LVOT Area:     3.14 cm  RIGHT VENTRICLE RV Basal diam:  2.70 cm RV Mid diam:    2.10 cm RV S prime:     8.27 cm/s TAPSE (M-mode): 1.3 cm LEFT ATRIUM             Index        RIGHT ATRIUM          Index LA diam:        1.80 cm 1.04 cm/m   RA Area:     8.76 cm LA Vol (A2C):   23.2 ml 13.37 ml/m  RA Volume:   16.80 ml 9.68 ml/m LA Vol (A4C):   25.5 ml 14.70 ml/m LA Biplane Vol: 26.5 ml 15.27 ml/m  AORTIC VALVE AV Area (Vmax):    2.57 cm AV Area (Vmean):   2.98 cm AV Area (VTI):     2.67 cm AV Vmax:           115.00 cm/s AV Vmean:          69.200 cm/s AV VTI:            0.221 m AV Peak Grad:      5.3 mmHg AV Mean Grad:      2.0 mmHg LVOT Vmax:         94.20 cm/s LVOT Vmean:        65.600 cm/s LVOT VTI:          0.188 m LVOT/AV VTI ratio: 0.85  AORTA Ao Root diam: 2.70 cm MITRAL VALVE                TRICUSPID VALVE MV Area (PHT): 4.15 cm     TR Peak grad:   13.8 mmHg MV Area VTI:   1.95 cm     TR Vmax:        186.00 cm/s MV Peak  grad:  4.6 mmHg MV Mean grad:  2.0 mmHg     SHUNTS MV Vmax:       1.07 m/s     Systemic VTI:  0.19 m MV Vmean:      66.0 cm/s    Systemic Diam: 2.00 cm MV Decel Time: 183 msec MV E velocity: 82.80 cm/s MV A velocity: 106.00 cm/s MV E/A ratio:  0.78 Redell Cave  MD Electronically signed by Redell Cave MD Signature Date/Time: 05/12/2023/2:11:55 PM    Final    ECHOCARDIOGRAM COMPLETE Result Date: 05/11/2023    ECHOCARDIOGRAM REPORT   Patient Name:   ELLINA SIVERTSEN Date of Exam: 05/11/2023 Medical Rec #:  [id number]       Height:       57.0 in Accession #:    [id number]      Weight:       183.8 lb Date of Birth:  [birth year]      BSA:          1.735 m Patient Age:    67 years        BP:           131/71 mmHg Patient Gender: F               HR:           70 bpm. Exam Location:  [facility] Procedure: 2D Echo, Cardiac Doppler and Color Doppler Indications:     TIA  History:         Patient has no prior history of Echocardiogram examinations.                  CHF, TIA and Stroke; Risk Factors:Hypertension and Sleep Apnea.  Sonographer:     Naomie Reef Referring Phys:  [phone] ELSPETH PARAS NEWTON Diagnosing Phys: Cara JONETTA Lovelace MD  Sonographer Comments: Image acquisition challenging due to respiratory motion. IMPRESSIONS  1. Left ventricular ejection fraction, by estimation, is 55 to 60%. The left ventricle has normal function. The left ventricle has no regional wall motion abnormalities. Left ventricular diastolic parameters are consistent with Grade I diastolic dysfunction (impaired relaxation).  2. Right ventricular systolic function is normal. The right ventricular size is normal. There is normal pulmonary artery systolic pressure.  3. The mitral valve is grossly normal. No evidence of mitral valve regurgitation.  4. The aortic valve is normal in structure. Aortic valve regurgitation is not visualized. Aortic valve sclerosis is present, with no evidence of aortic valve stenosis. FINDINGS  Left Ventricle: Left ventricular ejection fraction, by estimation, is 55 to 60%. The left ventricle has normal function. The left ventricle has no regional wall motion abnormalities. The left ventricular internal cavity size was normal in size. There is  no left ventricular hypertrophy.  Left ventricular diastolic parameters are consistent with Grade I diastolic dysfunction (impaired relaxation). Right Ventricle: The right ventricular size is normal. No increase in right ventricular wall thickness. Right ventricular systolic function is normal. There is normal pulmonary artery systolic pressure. The tricuspid regurgitant velocity is 2.57 m/s, and  with an assumed right atrial pressure of 3 mmHg, the estimated right ventricular systolic pressure is 29.4 mmHg. Left Atrium: Left atrial size was normal in size. Right Atrium: Right atrial size was normal in size. Pericardium: There is no evidence of pericardial effusion. Mitral Valve: The mitral valve is grossly normal. No evidence of mitral valve regurgitation. MV peak gradient, 5.3 mmHg. The mean mitral valve gradient is 2.0 mmHg. Tricuspid Valve: The  tricuspid valve is normal in structure. Tricuspid valve regurgitation is trivial. Aortic Valve: The aortic valve is normal in structure. Aortic valve regurgitation is not visualized. Aortic valve sclerosis is present, with no evidence of aortic valve stenosis. Aortic valve mean gradient measures 6.0 mmHg. Aortic valve peak gradient measures 13.0 mmHg. Aortic valve area, by VTI measures 2.06 cm. Pulmonic Valve: The pulmonic valve was normal in structure. Pulmonic valve regurgitation is not visualized. Aorta: The ascending aorta was not well visualized. IAS/Shunts: No atrial level shunt detected by color flow Doppler.  LEFT VENTRICLE PLAX 2D LVIDd:         4.20 cm     Diastology LVIDs:         2.70 cm     LV e' medial:    10.10 cm/s LV PW:         0.80 cm     LV E/e' medial:  8.3 LV IVS:        1.10 cm     LV e' lateral:   12.40 cm/s LVOT diam:     1.80 cm     LV E/e' lateral: 6.8 LV SV:         76 LV SV Index:   44 LVOT Area:     2.54 cm  LV Volumes (MOD) LV vol d, MOD A2C: 39.1 ml LV vol d, MOD A4C: 44.7 ml LV vol s, MOD A2C: 19.1 ml LV vol s, MOD A4C: 17.5 ml LV SV MOD A2C:     20.0 ml LV SV MOD A4C:      44.7 ml LV SV MOD BP:      23.5 ml RIGHT VENTRICLE RV Basal diam:  3.00 cm RV Mid diam:    2.50 cm RV S prime:     11.40 cm/s LEFT ATRIUM             Index        RIGHT ATRIUM           Index LA diam:        3.10 cm 1.79 cm/m   RA Area:     11.70 cm LA Vol (A2C):   53.0 ml 30.54 ml/m  RA Volume:   22.60 ml  13.02 ml/m LA Vol (A4C):   40.3 ml 23.22 ml/m LA Biplane Vol: 48.7 ml 28.07 ml/m  AORTIC VALVE                     PULMONIC VALVE AV Area (Vmax):    2.06 cm      PV Vmax:       1.00 m/s AV Area (Vmean):   1.87 cm      PV Peak grad:  4.0 mmHg AV Area (VTI):     2.06 cm AV Vmax:           180.00 cm/s AV Vmean:          115.000 cm/s AV VTI:            0.370 m AV Peak Grad:      13.0 mmHg AV Mean Grad:      6.0 mmHg LVOT Vmax:         146.00 cm/s LVOT Vmean:        84.300 cm/s LVOT VTI:          0.299 m LVOT/AV VTI ratio: 0.81  AORTA Ao Root diam: 3.00 cm MITRAL VALVE  TRICUSPID VALVE MV Area (PHT): 2.73 cm     TR Peak grad:   26.4 mmHg MV Area VTI:   2.46 cm     TR Vmax:        257.00 cm/s MV Peak grad:  5.3 mmHg MV Mean grad:  2.0 mmHg     SHUNTS MV Vmax:       1.15 m/s     Systemic VTI:  0.30 m MV Vmean:      58.3 cm/s    Systemic Diam: 1.80 cm MV Decel Time: 278 msec MV E velocity: 83.80 cm/s MV A velocity: 106.00 cm/s MV E/A ratio:  0.79 Dwayne D Callwood MD Electronically signed by Cara JONETTA Lovelace MD Signature Date/Time: 05/11/2023/6:24:25 PM    Final    US  Venous Img Lower Bilateral (DVT) Result Date: 05/09/2023 CLINICAL DATA:  Lower extremity pain. History of prior left posterior tibial vein DVT in 2022. EXAM: BILATERAL LOWER EXTREMITY VENOUS DOPPLER ULTRASOUND TECHNIQUE: Gray-scale sonography with graded compression, as well as color Doppler and duplex ultrasound were performed to evaluate the lower extremity deep venous systems from the level of the common femoral vein and including the common femoral, femoral, profunda femoral, popliteal and calf veins including the  posterior tibial, peroneal and gastrocnemius veins when visible. The superficial great saphenous vein was also interrogated. Spectral Doppler was utilized to evaluate flow at rest and with distal augmentation maneuvers in the common femoral, femoral and popliteal veins. COMPARISON:  Report from a prior lower extremity venous duplex study at [facility] dated 12/02/2020 FINDINGS: RIGHT LOWER EXTREMITY Common Femoral Vein: No evidence of thrombus. Normal compressibility, respiratory phasicity and response to augmentation. Saphenofemoral Junction: No evidence of thrombus. Normal compressibility and flow on color Doppler imaging. Profunda Femoral Vein: No evidence of thrombus. Normal compressibility and flow on color Doppler imaging. Femoral Vein: No evidence of thrombus. Normal compressibility, respiratory phasicity and response to augmentation. Popliteal Vein: No evidence of thrombus. Normal compressibility, respiratory phasicity and response to augmentation. Calf Veins: No evidence of thrombus. Normal compressibility and flow on color Doppler imaging. Superficial Great Saphenous Vein: No evidence of thrombus. Normal compressibility. Venous Reflux:  None. Other Findings: No evidence of superficial thrombophlebitis or abnormal fluid collection. LEFT LOWER EXTREMITY Common Femoral Vein: No evidence of thrombus. Normal compressibility, respiratory phasicity and response to augmentation. Saphenofemoral Junction: No evidence of thrombus. Normal compressibility and flow on color Doppler imaging. Profunda Femoral Vein: No evidence of thrombus. Normal compressibility and flow on color Doppler imaging. Femoral Vein: No evidence of thrombus. Normal compressibility, respiratory phasicity and response to augmentation. Popliteal Vein: No evidence of thrombus. Normal compressibility, respiratory phasicity and response to augmentation. Calf Veins: No evidence of thrombus. No evidence of further left posterior tibial vein thrombus or  visible chronic thrombus. Normal compressibility and flow on color Doppler imaging. Superficial Great Saphenous Vein: No evidence of thrombus. Normal compressibility. Venous Reflux:  None. Other Findings: No evidence of superficial thrombophlebitis or abnormal fluid collection. IMPRESSION: No evidence of deep venous thrombosis in either lower extremity. Resolution of left posterior tibial vein DVT. Electronically Signed   By: Marcey Moan M.D.   On: 05/09/2023 13:00   MR BRAIN WO CONTRAST Result Date: 05/08/2023 CLINICAL DATA:  Stroke suspected, weakness that started last night around 11 p.m. EXAM: MRI HEAD WITHOUT CONTRAST TECHNIQUE: Multiplanar, multiecho pulse sequences of the brain and surrounding structures were obtained without intravenous contrast. COMPARISON:  No prior MRI available, correlation is made with 05/08/2023 CT head FINDINGS: Brain:  Restricted diffusion with ADC correlate in the left occipital lobe, both cortex and subcortical white matter (series 9, images 18-27), which correlates with the hypodensity seen on the same-day CT. Additional smaller area of restricted diffusion with ADC correlate in the right occipital cortex (series 9, images 25-29). The bilateral occipital infarcts are associated with foci of hemosiderin deposition (series 13, image 32 on the right and 25 on the left), which may represent thromboemboli. These areas are associated with increased T2 hyperintense signal. Additional punctate foci of restricted diffusion in the right cerebellum (series 9, images 17-19 and 21). Possible restricted diffusion with ADC correlate in the right lateral pons (series 9, image 19). No acute hemorrhage, mass, mass effect, or midline shift. No hydrocephalus or extra-axial collection. Pituitary and craniocervical junction within normal limits. Remote infarcts in the left cerebellum. Cerebral volume is normal for age. Vascular: Normal arterial flow voids. Skull and upper cervical spine: Normal  marrow signal. Sinuses/Orbits: Clear paranasal sinuses. No acute finding in the orbits. Other: The mastoid air cells are well aerated. IMPRESSION: 1. Acute infarcts in the bilateral occipital lobes, left greater than right, which correlate with the hypodensity seen on the same-day CT. The bilateral occipital infarcts are associated with foci of hemosiderin deposition, which may represent thromboemboli. 2. Additional punctate acute infarcts in the right cerebellum and possible acute infarct in the right lateral pons. These results will be called to the ordering clinician or representative by the Radiologist Assistant, and communication documented in the PACS or [employer]. Electronically Signed   By: Donald Campion M.D.   On: 05/08/2023 21:57   CT ANGIO HEAD NECK W WO CM Result Date: 05/08/2023 CLINICAL DATA:  Weakness beginning last night.  Syncopal episode. EXAM: CT ANGIOGRAPHY HEAD AND NECK WITH AND WITHOUT CONTRAST TECHNIQUE: Multidetector CT imaging of the head and neck was performed using the standard protocol during bolus administration of intravenous contrast. Multiplanar CT image reconstructions and MIPs were obtained to evaluate the vascular anatomy. Carotid stenosis measurements (when applicable) are obtained utilizing NASCET criteria, using the distal internal carotid diameter as the denominator. RADIATION DOSE REDUCTION: This exam was performed according to the departmental dose-optimization program which includes automated exposure control, adjustment of the mA and/or kV according to patient size and/or use of iterative reconstruction technique. CONTRAST:  75mL OMNIPAQUE  IOHEXOL  350 MG/ML SOLN COMPARISON:  CT head without contrast 05/08/2023 and 01/09/2021. FINDINGS: CTA NECK FINDINGS Aortic arch: Atherosclerotic calcifications are present in the aortic arch. No aneurysm or stenosis is present. Great vessel origins are within normal limits. Right carotid system: The right common carotid artery  is within normal limits. The bifurcation is unremarkable. Mild tortuosity of the cervical right ICA is present without significant stenosis. Left carotid system: The left common carotid artery is within normal limits. Minimal calcification is present at the bifurcation. Mild tortuosity of the cervical left ICA is present without significant stenosis. Vertebral arteries: The left vertebral artery is slightly dominant. Both vertebral arteries originate from the subclavian arteries without significant stenosis. No significant stenosis is present in either vertebral artery in the neck. Skeleton: Mild degenerative changes are present cervical spine. Vertebral body heights and alignment are normal. No focal osseous lesions are present. Other neck: Soft tissues the neck are otherwise unremarkable. Salivary glands are within normal limits. Thyroid is normal. No significant adenopathy is present. No focal mucosal or submucosal lesions are present. Upper chest: The lung apices are clear. The thoracic inlet is within normal limits. Review of the MIP images  confirms the above findings CTA HEAD FINDINGS Anterior circulation: Atherosclerotic calcifications are present within the cavernous internal carotid arteries bilaterally without focal stenosis through the ICA termini. The A1 and M1 segments are normal. The anterior communicating artery is patent. No definite anterior communicating artery is present. MCA bifurcations are within normal limits bilaterally. The ACA and MCA branch vessels are normal. No aneurysm is present. Posterior circulation: The PICA origins are visualized and normal. The vertebrobasilar junction basilar artery normal. The superior cerebellar arteries are patent bilaterally. Both posterior cerebral arteries originate from basilar tip. The PCA branch vessels are normal bilaterally. No aneurysm is present. Venous sinuses: The dural sinuses are patent. The straight sinus and deep cerebral veins are intact.  Cortical veins are within normal limits. No significant vascular malformation is evident. Anatomic variants: None Review of the MIP images confirms the above findings IMPRESSION: 1. No emergent large vessel occlusion. 2. Minimal atherosclerotic changes at the carotid bifurcations and cavernous internal carotid arteries bilaterally without significant stenosis. 3. Mild tortuosity of the cervical internal carotid arteries bilaterally likely reflects chronic hypertension. 4. Mild degenerative changes of the cervical spine. 5.  Aortic Atherosclerosis (ICD10-I70.0). Electronically Signed   By: Lonni Necessary M.D.   On: 05/08/2023 18:20   CT Head Wo Contrast Result Date: 05/08/2023 CLINICAL DATA:  Head trauma, minor (Age >= 65y); Neck trauma (Age >= 65y) EXAM: CT HEAD WITHOUT CONTRAST CT CERVICAL SPINE WITHOUT CONTRAST TECHNIQUE: Multidetector CT imaging of the head and cervical spine was performed following the standard protocol without intravenous contrast. Multiplanar CT image reconstructions of the cervical spine were also generated. RADIATION DOSE REDUCTION: This exam was performed according to the departmental dose-optimization program which includes automated exposure control, adjustment of the mA and/or kV according to patient size and/or use of iterative reconstruction technique. COMPARISON:  None Available. FINDINGS: CT HEAD FINDINGS Brain: No hemorrhage. No hydrocephalus. No extra-axial fluid collection. 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 No mass effect. No mass lesion. Vascular: No hyperdense vessel or unexpected calcification. Skull: Normal. Negative for fracture or focal lesion. Sinuses/Orbits: No middle ear or mastoid effusion. Paranasal sinuses notable for mucosal thickening in the right sphenoid sinus. Orbits are unremarkable. Other: None. CT CERVICAL SPINE FINDINGS Alignment: Normal. Skull base and vertebrae: No acute fracture. No primary bone  lesion or focal pathologic process. Soft tissues and spinal canal: No prevertebral fluid or swelling. No visible canal hematoma. Disc levels:  No evidence of high-grade stenosis. Upper chest: Negative. Other: None IMPRESSION: 1. 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 Recommend brain MRI for further evaluation. 2. No CT evidence of intracranial injury 3. No acute fracture or traumatic subluxation of the cervical spine. Electronically Signed   By: Lyndall Gore M.D.   On: 05/08/2023 13:11   CT Cervical Spine Wo Contrast Result Date: 05/08/2023 CLINICAL DATA:  Head trauma, minor (Age >= 65y); Neck trauma (Age >= 65y) EXAM: CT HEAD WITHOUT CONTRAST CT CERVICAL SPINE WITHOUT CONTRAST TECHNIQUE: Multidetector CT imaging of the head and cervical spine was performed following the standard protocol without intravenous contrast. Multiplanar CT image reconstructions of the cervical spine were also generated. RADIATION DOSE REDUCTION: This exam was performed according to the departmental dose-optimization program which includes automated exposure control, adjustment of the mA and/or kV according to patient size and/or use of iterative reconstruction technique. COMPARISON:  None Available. FINDINGS: CT HEAD FINDINGS Brain: No hemorrhage. No hydrocephalus. No extra-axial fluid collection. Acute  to subacute cortical infarct in the left occipital lobe (series 2, image 14). No mass effect. No mass lesion. Vascular: No hyperdense vessel or unexpected calcification. Skull: Normal. Negative for fracture or focal lesion. Sinuses/Orbits: No middle ear or mastoid effusion. Paranasal sinuses notable for mucosal thickening in the right sphenoid sinus. Orbits are unremarkable. Other: None. CT CERVICAL SPINE FINDINGS Alignment: Normal. Skull base and vertebrae: No acute fracture. No primary bone lesion or focal pathologic process. Soft tissues and spinal canal: No prevertebral fluid or swelling. No visible canal  hematoma. Disc levels:  No evidence of high-grade stenosis. Upper chest: Negative. Other: None IMPRESSION: 1. 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 Recommend brain MRI for further evaluation. 2. No CT evidence of intracranial injury 3. No acute fracture or traumatic subluxation of the cervical spine. Electronically Signed   By: Lyndall Gore M.D.   On: 05/08/2023 13:11    Labs:  Basic Metabolic Panel: Recent Labs  Lab 05/25/23 0501  NA 138  K 3.9  CL 107  CO2 23  GLUCOSE 75  BUN 13  CREATININE 0.91  CALCIUM  8.1*    CBC: Recent Labs  Lab 05/25/23 0501  WBC 7.5  HGB 11.0*  HCT 33.7*  MCV 96.3  PLT 254    CBG: No results for input(s): GLUCAP in the last 168 hours.  Family history.  Mother with hypertension daughter with bipolar disorder.  Denies any breast cancer esophageal cancer or rectal cancer  Brief HPI:   Christine Reyes is a 68 y.o. right-handed female with history significant for anxiety hypertension, iron  deficiency anemia, obesity status post gastric bypass with BMI 39.77, quit smoking 7 years ago, right TKA 2019 complicated by DVT.  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  1 level home 3 steps to entry.  Independent prior to admission.  There is a granddaughter and a daughter in the area with good support.  [facility]ed [facility] after reported fall with altered mental status headache generalized weakness as well as blurred vision.  Cranial CT scan showed acute to subacute cortical in the left occipital lobe.  CT cervical spine negative.  CTA of head and neck no emergent large vessel occlusion.  MRI showed acute infarct in the bilateral occipital lobes left greater than right.  The bilateral occipital infarct with foci of hemosiderin deposition possibly representing thromboemboli.  Additional punctate acute infarct in the right cerebellum and possible acute infarct in the right lateral pons.  Patient did not  receive tPA.  Bilateral lower extremity ultrasound showed no evidence of DVT in either lower extremity and resolution of left posterior tibial vein DVT.  Admission chemistries unremarkable except sodium 125 osmolality 273 urine osmolality 686 TSH 4.077.  Echocardiogram with ejection fraction of 55 to 60% no wall motion abnormalities grade 1 diastolic dysfunction.  Neurology follow-up placed on aspirin  and Plavix  for CVA prophylaxis x 3 weeks then aspirin  alone.  Lovenox  for DVT prophylaxis.  Hyponatremia resolved after initial fluid restriction latest sodium 141.  Tolerating a regular consistency diet.  Therapy evaluations completed due to patient decreased functional mobility was admitted for a comprehensive rehab program   Hospital Course: REITA SHINDLER was admitted to rehab 05/14/2023 for inpatient therapies to consist of PT, ST and OT at least three hours five days a week. Past admission physiatrist, therapy team and rehab RN have worked together to provide customized collaborative inpatient rehab.  Pertaining to patient's multifocal  posterior circulation infarct remained stable.  She will continue on low-dose aspirin  and Plavix  x 3 weeks through 05/28/2023 then aspirin  alone and follow-up neurology services.  Lovenox  for DVT prophylaxis.  Pain management use of Neurontin  titrated as needed using baclofen  as needed for muscle spasms oxycodone  for breakthrough pain.  She was having some difficulty in sleeping with assistance of trazodone  added with good results.  Lipitor ongoing for hyperlipidemia.  History of gastric bypass BMI 39.77 dietary follow-up.  Iron  deficiency anemia monitoring of hemoglobin hematocrit follow-up outpatient.  Permissive hypertension patient on Benicar  prior to admission.  Resume as needed follow-up outpatient.  Bouts of hyponatremia resolved after initial fluid restriction latest sodium 141.   Blood pressures were monitored on TID basis and controlled      Rehab course: During  patient's stay in rehab weekly team conferences were held to monitor patient's progress, set goals and discuss barriers to discharge. At admission, patient required contact-guard minimal assist 100 feet rolling walker minimal assist step pivot transfers  Physical exam.  Blood pressure 101/69 pulse 79 temperature 98.4 respirations 18 oxygen saturation is 92% room air Constitutional.  No acute distress HEENT Head.  Normocephalic and atraumatic Eyes.  Pupils round and reactive to light no discharge without nystagmus Neck.  Supple nontender no JVD without thyromegaly Cardiac regular rate and rhythm without any extra sounds or murmur heard Abdomen.  Soft nontender positive bowel sounds without rebound Respiratory effort normal no respiratory distress without wheeze Skin.  Clean dry and intact Neurologic.  Alert and oriented to person place and event moderate dysarthria.  Names 3/3 objects correctly.  Fair insight into condition. Manual muscle testing Right upper extremity 2/5 SA 3/5 EF 3/5 EE 4/5W EE 4/5 FF 4/5 FA Left upper extremity 2/5 SA 3/5 PF 3/5 EE 4/5W EE 4/5 FF 4/5 FA Right lower extremity 2/5 HF 4/5 KE 4/5 DF 4/5 EHL 4/5 PF Left lower extremity 1/5 HF 3/5 KE 4/5 DF 4/5 EHL 4/5 PF  He/She  has had improvement in activity tolerance, balance, postural control as well as ability to compensate for deficits. He/She has had improvement in functional use RUE/LUE  and RLE/LLE as well as improvement in awareness.  She was ambulating in room distances with rolling walker at supervision level.  Reaching for close on the floor without loss of balance and opening drawer/suitcases without difficulty in standing position.  Ambulates to the main rehab gym 150 feet supervision rolling walker.  Able to navigate up and down eight 3 inch steps with supervision level.  Completed supine to sit with supervision bed elevated.  Completed sit to stand from edge of bed rolling walker supervision.  Ambulates transfer to  the bathroom rolling walker close supervision.  Patient additionally completed functional ambulation greater than a household distance with rolling walker and supervision during ADLs.  Upper body dressing donned shirt  with set up assist..  Lower body dressing patient did require assist to doff pants from toilet.  Completed bathing task in sitting/standing with supervision.  Speech therapy follow-up facilitated sessions by prompting patient to participate in structured job interview task.  Encourage patient to utilize word finding strategies as needed during conversations.  Patient with intermittent anomia though able to communicate effectively given modified independence.  Full family teaching completed plan discharge to home       Disposition:  Discharge disposition: 01-Home or Self Care        Diet: Regular  Special Instructions: No driving smoking or alcohol  Medications  at discharge. 1.  Tylenol  as needed 2.  Aspirin  81 mg p.o. daily 3.  Lipitor 40 mg p.o. daily 4.  Baclofen  5 mg p.o. 3 times daily as needed muscle spasms 5.  Plavix  75 mg p.o. daily through 05/28/2023 and stop 6.  Iron -vitamin C  65-125 mg daily 7.  Folic acid  1 mg p.o. daily 8.  Neurontin  300 mg p.o. twice daily 9.  Protonix  40 mg p.o. daily 10.  Trazodone  100 mg p.o. nightly 11.  Benicar  40-25 mg 1 tablet daily 12.  Oxycodone  5 mg every 6 hours as needed moderate pain   30-35 minutes were spent completing discharge summary and discharge planning Discharge Instructions     Ambulatory referral to Neurology   Complete by: As directed    An appointment is requested in approximately: 4 weeks posterior circulation infarction   Ambulatory referral to Occupational Therapy   Complete by: As directed    Eval and treat   Ambulatory referral to Physical Medicine Rehab   Complete by: As directed    Moderate complexity follow-up 1 to 2 weeks posterior circulation infarction   Ambulatory referral to Physical  Therapy   Complete by: As directed    Eval and treat   Ambulatory referral to Speech Therapy   Complete by: As directed    Eval and treat        Follow-up Information     Kirsteins, Prentice BRAVO, MD Follow up.   Specialty: Physical Medicine and Rehabilitation Why: Office to call for appointment Contact information: [address] [place] [address] [place] [address] [phone]                 Signed: Toribio JINNY Pitch 05/26/2023, 7:38 AM

## 2023-05-15 NOTE — Progress Notes (Signed)
 Patient ID: Christine Reyes, female   DOB: [birth year], 68 y.o.   MRN: [id number] Met with the patient to review current medical situation, rehab schedule, team conference and plan of care. Discussed secondary risk management including HTN, HLD (LDL 89/Trig 60) on Lipitor and dietary modification recommendations.   Patient aware of multi small CVAs and one larger infarct. Reviewed location of infarcts and DAPT x 3 weeks then ASA solo per MD. Also reviewed note of right visual field deficit and generalized soreness on left side more than right.  Briefly reviewed ZIO patch monitor.  Continue to follow along to address educational needs to facilitate preparation for discharge home with daughter. [name]

## 2023-05-15 NOTE — Progress Notes (Signed)
 Occupational Therapy Session Note  Patient Details  Name: Christine Reyes MRN: [id number] Date of Birth: [birth year]  Today's Date: 05/16/2023 OT Individual Time: 1420-1520 OT Individual Time Calculation (min): 60 min    Short Term Goals: Week 1:  OT Short Term Goal 1 (Week 1): pt will be able to don underwear and pants with supervision only. OT Short Term Goal 2 (Week 1): Pt will be able to don socks and shoes with min A. OT Short Term Goal 3 (Week 1): Pt will ambulate to the toilet with RW with supervision. OT Short Term Goal 4 (Week 1): Pt will be able to engage in a room HEP using light theraband.  Skilled Therapeutic Interventions/Progress Updates:  Pt greeted resting in bed for skilled OT session with focus on BADL retraining and functional mobility.   Pain: Pt with 7/10 pain in B knees (L>R) and upper back, OT offering intermediate rest breaks and positioning suggestions throughout session to address pain/fatigue and maximize participation/safety in session. Medicated during session.  Functional Transfers: Sit<>stands, ambulating walk-in shower & toilet transfers, and functional mobility at household-level with CGA fading to close supervision + youth RW.   Self Care Tasks: Pt manages UB/LB dressing & bathing at close supervision level, intermediate UE support on grab bars & youth RW.   Therapeutic Activities/Exercise: Pt performs x5 STS without AD for BLE strengthening, ~5 sec of standing with encouragement to weight-shift onto LLE. CGA overall, intermediate lateral LOB towards R with fatigue, recovering with CGA.   Education: Pt educated on elevating BLE while in bed to assist with edema. Pt receptive.   Pt remained resting in bed with 4Ps assessed and immediate needs met. Pt continues to be appropriate for skilled OT intervention to promote further functional independence in ADLs/IADLs.   Therapy Documentation Precautions:  Precautions Precautions: Fall Precaution Comments:  L hemipareisis, intermittent LLE pain with open-chain movement Restrictions Weight Bearing Restrictions Per Provider Order: No   Therapy/Group: Individual Therapy  Nereida Habermann, OTR/L, MSOT  05/16/2023, 3:32 PM

## 2023-05-15 NOTE — Progress Notes (Signed)
 Inpatient Rehabilitation Care Coordinator Assessment and Plan Patient Details  Name: Christine Reyes MRN: [id number] Date of Birth: Feb 08, 1956  Today's Date: 05/15/2023  Hospital Problems: Principal Problem:   Posterior circulation stroke [facility])  Past Medical History:  Past Medical History:  Diagnosis Date   Anxiety    Arthritis    Headache    Hypertension    Iron  deficiency anemia 06/17/2018   Obesity    Sleep apnea    Past Surgical History:  Past Surgical History:  Procedure Laterality Date   ABDOMINAL HYSTERECTOMY     BREAST EXCISIONAL BIOPSY Right 1989   CHOLECYSTECTOMY     COLONOSCOPY WITH ESOPHAGOGASTRODUODENOSCOPY (EGD)     csection x2     DILATION AND CURETTAGE OF UTERUS     ESOPHAGOGASTRODUODENOSCOPY (EGD) WITH PROPOFOL  N/A 12/14/2015   Procedure: ESOPHAGOGASTRODUODENOSCOPY (EGD) WITH PROPOFOL ;  Surgeon: Lamar ONEIDA Holmes, MD;  Location: [facility] ENDOSCOPY;  Service: Endoscopy;  Laterality: N/A;   GASTRIC BYPASS     JOINT REPLACEMENT     left knee surgery     TOTAL KNEE ARTHROPLASTY Right 06/22/2017   Procedure: TOTAL KNEE ARTHROPLASTY;  Surgeon: Leora Lynwood JONELLE, MD;  Location: [facility] ORS;  Service: Orthopedics;  Laterality: Right;   Social History:  reports that she quit smoking about 7 years ago. Her smoking use included cigarettes. She has quit using smokeless tobacco. She reports current alcohol use of about 2.0 standard drinks of alcohol per week. She reports that she does not use drugs.  Family / Support Systems Marital Status: Married How Long?: verbally seperated for five years Patient Roles: Spouse, Parent Children: Dtr Warehouse Manager Other Supports: granddaughter Fish Farm Manager Anticipated Caregiver: PRN support from family. Ability/Limitations of Caregiver: Pt will have intermittent support as family will check in periodically throughout the day. 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 Education: 11th grade Health Literacy - How often do you need to have someone help you when you read instructions, pamphlets, or other written material from your doctor or pharmacy?: Rarely Writes: Yes Employment Status: Disabled Date Retired/Disabled/Unemployed: Became disabled in 2010 Legal History/Current Legal Issues: Denies Guardian/Conservator: N/A   Abuse/Neglect Abuse/Neglect Assessment Can Be Completed: Yes Physical Abuse: Denies Verbal Abuse: Denies Sexual Abuse: Denies Exploitation of patient/patient's resources: Denies Self-Neglect: Denies  Patient response to: Social Isolation - How often do you feel lonely or isolated from those around you?: Never  Emotional Status Pt's affect, behavior and adjustment status: Pt in good spirits at time of visits Recent Psychosocial Issues: Denies Psychiatric History: Denies Substance Abuse History: Pt admits to occassional etoh use. Denies any tobacco product use and rec drug use.  Patient / Family Perceptions, Expectations & Goals Pt/Family understanding of illness & functional limitations: Pt and family have a general understanding of pt care needs Premorbid pt/family roles/activities: Independent Anticipated changes in roles/activities/participation: Assistance with ADLs/IADLs Pt/family expectations/goals: Pt goal is to work on memory, and strengthening legs.  Community Resources [employer]: None Premorbid Home Care/DME Agencies: None Transportation available at discharge: Dtr Is the patient able to respond to transportation needs?: Yes In the past 12 months, has lack of transportation kept you from medical appointments or from getting medications?: No In the past 12 months, has lack of transportation kept you from meetings, work, or from getting things needed for  daily living?: No Resource referrals recommended: Neuropsychology  Discharge Planning Living Arrangements: Parent Support Systems: Children, Parent Type of Residence: Private residence Insurance Resources: Media Planner (specify), Medicaid (specify county) (UHC Medicare; Medicaid-[place] [employer]) Surveyor, Quantity Resources: Restaurant Manager, Fast Food Screen Referred: No Living Expenses: Banker Management: Patient Does the patient have any problems obtaining your medications?: No Home Management: Pt managed all home care needs Patient/Family Preliminary Plans: TBD Care Coordinator Barriers to Discharge: Decreased caregiver support, Lack of/limited family support, Community Education Officer for SNF coverage Care Coordinator Anticipated Follow Up Needs: HH/OP  Clinical Impression SW met with pt, pt dtr, and granddaughter in room to introduce self, explain role, and discuss discharge process. Pt is not a cytogeneticist. No HCPOA. DME: RW, cane, w/c, shower stool, and 3in1 BSC.   Freya Zobrist A Aiven Kampe 05/15/2023, 1:56 PM

## 2023-05-15 NOTE — Evaluation (Signed)
 Occupational Therapy Assessment and Plan  Patient Details  Name: Christine Reyes MRN: [id number] Date of Birth: 21-Nov-1955  OT Diagnosis: blindness and low vision, cognitive deficits, hemiplegia affecting dominant side, and muscle weakness (generalized) Rehab Potential: Rehab Potential (ACUTE ONLY): Good ELOS: 10-14 days   Today's Date: 05/15/2023 OT Individual Time: 9069-8954 OT Individual Time Calculation (min): 75 min     Hospital Problem: Principal Problem:   Posterior circulation stroke [facility])   Past Medical History:  Past Medical History:  Diagnosis Date   Anxiety    Arthritis    Headache    Hypertension    Iron  deficiency anemia 06/17/2018   Obesity    Sleep apnea    Past Surgical History:  Past Surgical History:  Procedure Laterality Date   ABDOMINAL HYSTERECTOMY     BREAST EXCISIONAL BIOPSY Right 1989   CHOLECYSTECTOMY     COLONOSCOPY WITH ESOPHAGOGASTRODUODENOSCOPY (EGD)     csection x2     DILATION AND CURETTAGE OF UTERUS     ESOPHAGOGASTRODUODENOSCOPY (EGD) WITH PROPOFOL  N/A 12/14/2015   Procedure: ESOPHAGOGASTRODUODENOSCOPY (EGD) WITH PROPOFOL ;  Surgeon: Lamar ONEIDA Holmes, MD;  Location: [facility] ENDOSCOPY;  Service: Endoscopy;  Laterality: N/A;   GASTRIC BYPASS     JOINT REPLACEMENT     left knee surgery     TOTAL KNEE ARTHROPLASTY Right 06/22/2017   Procedure: TOTAL KNEE ARTHROPLASTY;  Surgeon: Leora Lynwood JONELLE, MD;  Location: [facility] ORS;  Service: Orthopedics;  Laterality: Right;    Assessment & Plan Clinical Impression: . Christine Reyes is a 68 year old right-handed female with history significant for anxiety, hypertension, iron  deficiency anemia, obesity status post gastric bypass with BMI 39.77, quit smoking 7 years ago, right TKA 2019 complicated by DVT.  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  1 level home 3 steps to entry.  Independent prior to admission.  There is a daughter as well as a granddaughter with good support.   Presented to [facility] 05/08/2023 after reported fall with altered mental status/headache, generalized weakness as well as blurred vision.  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  CT cervical spine negative.  CTA of head and neck with no emergent large vessel occlusion.  MRI showed acute infarct in the bilateral occipital lobes, left greater than right.  The bilateral occipital infarcts with foci of hemosiderin deposition possibly representing thromboemboli.  Additional punctate acute infarct in the right cerebellum and possible acute infarct in the right lateral pons.  Patient did not receive tPA.  Bilateral lower extremity ultrasound showed no evidence of DVT in either lower extremity and resolution of left posterior tibial vein DVT.  Admission chemistries unremarkable except sodium of 125, CO2 of 16, osmolality 273, urine osmolality 686, free T4 0.56, TSH 4.077.  Echocardiogram with ejection fraction of 55 to 60% no wall motion abnormalities grade 1 diastolic dysfunction.  Neurology follow-up placed on aspirin  81 mg daily and Plavix  75 mg daily for CVA prophylaxis x 3 weeks then aspirin  alone.  Lovenox  added for DVT prophylaxis.  Hyponatremia resolved after initial fluid restriction latest sodium of 141.  Tolerating a regular consistency diet.  Therapy evaluations completed due to patient's decreased functional mobility was admitted for a comprehensive rehab program.    Patient transferred to CIR on 05/14/2023 .    Patient currently requires min with basic self-care skills secondary to muscle weakness, decreased cardiorespiratoy endurance, unbalanced muscle activation and decreased coordination, decreased visual  acuity, decreased memory, and decreased sitting balance, decreased standing balance, hemiplegia, and decreased balance strategies.  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    Patient will benefit from skilled  intervention to increase independence with basic self-care skills prior to discharge home with care partner.  Anticipate patient will require intermittent supervision and follow up home health.  OT - End of Session Endurance Deficit: Yes OT Assessment Rehab Potential (ACUTE ONLY): Good OT Patient demonstrates impairments in the following area(s): Balance;Cognition;Endurance;Motor;Vision OT Basic ADL's Functional Problem(s): Bathing;Dressing;Toileting;Grooming OT Advanced ADL's Functional Problem(s): Light Housekeeping OT Transfers Functional Problem(s): Toilet;Tub/Shower OT Additional Impairment(s): None OT Plan OT Intensity: Minimum of 1-2 x/day, 45 to 90 minutes OT Frequency: 5 out of 7 days OT Duration/Estimated Length of Stay: 10-14 days OT Treatment/Interventions: Balance/vestibular training;Cognitive remediation/compensation;DME/adaptive equipment instruction;Discharge planning;Functional mobility training;Neuromuscular re-education;Psychosocial support;Patient/family education;Pain management;Self Care/advanced ADL retraining;UE/LE Strength taining/ROM;Therapeutic Exercise;Therapeutic Activities;UE/LE Coordination activities;Visual/perceptual remediation/compensation OT Self Feeding Anticipated Outcome(s): independent OT Basic Self-Care Anticipated Outcome(s): Mod I OT Toileting Anticipated Outcome(s): Mod I OT Bathroom Transfers Anticipated Outcome(s): supervision for tub transfers, mod I for shower transfers. OT Recommendation Patient destination: Home Follow Up Recommendations: Home health OT Equipment Recommended: To be determined   OT Evaluation Precautions/Restrictions   fall   Pain Pain Assessment Pain Scale: 0-10 Pain Score: 5  Pain Type: Acute pain Pain Location: Neck Pain Orientation: Left Pain Radiating Towards: shou Pain Descriptors / Indicators: Aching Pain Frequency: Constant Pain Onset: On-going Patients Stated Pain Goal: 0 Pain Intervention(s):  Medication (See eMAR) Home Living/Prior Functioning Home Living Family/patient expects to be discharged to:: Private residence Living Arrangements: Parent Available Help at Discharge: Family, Available PRN/intermittently Type of Home: House Home Access: Ramped entrance Entrance Stairs-Number of Steps: 3 Entrance Stairs-Rails: Can reach both Home Layout: One level Bathroom Shower/Tub: Tub/shower unit Additional Comments: pt has a tub bench but not sure where it is. Has a shower chair.  Lives With: Alone IADL History Occupation: Retired Prior Function Level of Independence: Independent with basic ADLs, Independent with homemaking with ambulation  Able to Take Stairs?: Yes Driving: Yes Vision Baseline Vision/History: 1 Wears glasses (reading only) Ability to See in Adequate Light: 1 Impaired Patient Visual Report: Blurring of vision;Eye fatigue/eye pain/headache Vision Assessment?: Yes;Vision impaired- to be further tested in functional context Eye Alignment: Within Functional Limits Ocular Range of Motion: Within Functional Limits Alignment/Gaze Preference: Within Defined Limits Tracking/Visual Pursuits: Able to track stimulus in all quads without difficulty Convergence: Impaired (comment) (R eye does not adduct) Visual Fields: No apparent deficits Perception  Perception: Within Functional Limits Praxis Praxis: WFL Cognition Cognition Arousal/Alertness: Awake/alert Orientation Level: Person;Place;Situation Person: Oriented Place: Oriented Situation: Oriented Brief Interview for Mental Status (BIMS) Repetition of Three Words (First Attempt): 3 Temporal Orientation: Year: Correct Temporal Orientation: Month: Accurate within 5 days Temporal Orientation: Day: Correct Recall: Sock: Yes, no cue required Recall: Blue: Yes, no cue required Recall: Bed: Yes, no cue required BIMS Summary Score: 15 Sensation Sensation Light Touch: Appears Intact Hot/Cold: Appears  Intact Proprioception: Appears Intact Additional Comments: pt states she has tingling in fingers and toes prior to admission, has not received dx of neuropathy but she suspects that is what she has Coordination Gross Motor Movements are Fluid and Coordinated: No Fine Motor Movements are Fluid and Coordinated: No Coordination and Movement Description: slow and delayed on L side Motor  Motor Motor: Hemiplegia Motor - Skilled Clinical Observations: left side weakness  Trunk/Postural Assessment  Postural Control Postural Control: Within  Functional Limits  Balance Dynamic Sitting Balance Dynamic Sitting - Level of Assistance: 5: Stand by assistance Dynamic Standing Balance Dynamic Standing - Level of Assistance: 4: Min assist Extremity/Trunk Assessment RUE Assessment Active Range of Motion (AROM) Comments: shoulder flexion to 120 General Strength Comments: 4-/5 LUE Assessment Active Range of Motion (AROM) Comments: 90 degrees sh flexion General Strength Comments: pt states she has had L hand weakness for years and has been unable to open jars or use L hand functionally except for writing.  Pt does feel her L side is weaker post CVA compared to normal weakness she has been dealing with for years.  Overall 4-/5 strength  Care Tool Care Tool Self Care Eating   Eating Assist Level: Set up assist    Oral Care    Oral Care Assist Level: Set up assist    Bathing   Body parts bathed by patient: Left arm;Right arm;Chest;Abdomen;Front perineal area;Buttocks;Right upper leg;Face;Left upper leg Body parts bathed by helper: Right lower leg;Left lower leg   Assist Level: Minimal Assistance - Patient > 75%    Upper Body Dressing(including orthotics)   What is the patient wearing?: Bra;Pull over shirt   Assist Level: Supervision/Verbal cueing    Lower Body Dressing (excluding footwear)   What is the patient wearing?: Underwear/pull up;Pants Assist for lower body dressing: Contact  Guard/Touching assist    Putting on/Taking off footwear   What is the patient wearing?: Non-skid slipper socks Assist for footwear: Total Assistance - Patient < 25%       Care Tool Toileting Toileting activity   Assist for toileting: Minimal Assistance - Patient > 75%     Care Tool Bed Mobility Roll left and right activity   Roll left and right assist level: Supervision/Verbal cueing    Sit to lying activity        Lying to sitting on side of bed activity   Lying to sitting on side of bed assist level: the ability to move from lying on the back to sitting on the side of the bed with no back support.: Supervision/Verbal cueing     Care Tool Transfers Sit to stand transfer   Sit to stand assist level: Contact Guard/Touching assist    Chair/bed transfer   Chair/bed transfer assist level: Minimal Assistance - Patient > 75%     Toilet transfer   Assist Level: Minimal Assistance - Patient > 75%     Care Tool Cognition  Expression of Ideas and Wants Expression of Ideas and Wants: 3. Some difficulty - exhibits some difficulty with expressing needs and ideas (e.g, some words or finishing thoughts) or speech is not clear  Understanding Verbal and Non-Verbal Content Understanding Verbal and Non-Verbal Content: 3. Usually understands - understands most conversations, but misses some part/intent of message. Requires cues at times to understand   Memory/Recall Ability Memory/Recall Ability : Current season;That he or she is in a hospital/hospital unit   Refer to Care Plan for Long Term Goals  SHORT TERM GOAL WEEK 1 OT Short Term Goal 1 (Week 1): pt will be able to don underwear and pants with supervision only. OT Short Term Goal 2 (Week 1): Pt will be able to don socks and shoes with min A. OT Short Term Goal 3 (Week 1): Pt will ambulate to the toilet with RW with supervision. OT Short Term Goal 4 (Week 1): Pt will be able to engage in a room HEP using light  theraband.  Recommendations for other services: None  Skilled Therapeutic Intervention ADL ADL Eating: Set up Grooming: Setup Upper Body Bathing: Supervision/safety Where Assessed-Upper Body Bathing: Shower Lower Body Bathing: Minimal assistance Where Assessed-Lower Body Bathing: Shower Upper Body Dressing: Supervision/safety Lower Body Dressing: Minimal assistance Toileting: Minimal assistance Where Assessed-Toileting: Teacher, Adult Education: Curator Method: Proofreader: Acupuncturist: Insurance Underwriter Method: Designer, Industrial/product: Transfer tub bench;Grab bars    Pt seen for initial evaluation and ADL training with a focus on safe functional mobility with RW. Pt needs a youth walker but unable to locate one at the time of the eval.  Pt agreeable to a shower,  completed shower on tub bench and then dressed sitting on seat in bathroom. She is mostly limited by back pain and L shoulder pain that has been bothering her for years.  This CVA has caused her to have overall weakness and blurry vision.   Reviewed role of OT, discussed POC and pt's goals, and ELOS. Pt resting in recliner With all needs met and belt alarm set.     Discharge Criteria: Patient will be discharged from OT if patient refuses treatment 3 consecutive times without medical reason, if treatment goals not met, if there is a change in medical status, if patient makes no progress towards goals or if patient is discharged from hospital.  The above assessment, treatment plan, treatment alternatives and goals were discussed and mutually agreed upon: by patient  [facility] 05/15/2023, 12:06 PM

## 2023-05-15 NOTE — IPOC Note (Addendum)
 Overall Plan of Care [facility]) Patient Details Name: Christine Reyes MRN: [id number] DOB: [birth year]  Admitting Diagnosis: Posterior circulation stroke [facility])  Hospital Problems: Principal Problem:   Posterior circulation stroke [facility])     Functional Problem List: Nursing Bowel, Pain, Safety, Endurance, Medication Management  PT Balance, Endurance, Motor, Nutrition, Pain, Perception, Safety, Sensory  OT Balance, Cognition, Endurance, Motor, Vision  SLP    TR         Basic ADL's: OT Bathing, Dressing, Toileting, Grooming     Advanced  ADL's: OT Light Housekeeping     Transfers: PT Bed Mobility, Bed to Chair, Car, Occupational Psychologist, Research Scientist (life Sciences): PT Ambulation, Stairs     Additional Impairments: OT None  SLP Social Cognition, Communication expression Memory  TR      Anticipated Outcomes Item Anticipated Outcome  Self Feeding independent  Swallowing      Basic self-care  Mod I  Toileting  Mod I   Bathroom Transfers supervision for tub transfers, mod I for shower transfers.  Bowel/Bladder  manage bowel w mod I assist  Transfers  Mod I  Locomotion  Mod I/ supervision  Communication  supervision A  Cognition  minA  Pain  < 4 with prns  Safety/Judgment  manage w cues   Therapy Plan: PT Intensity: Minimum of 1-2 x/day ,45 to 90 minutes PT Frequency: 5 out of 7 days PT Duration Estimated Length of Stay: 10-14 days OT Intensity: Minimum of 1-2 x/day, 45 to 90 minutes OT Frequency: 5 out of 7 days OT Duration/Estimated Length of Stay: 10-14 days SLP Intensity: Minumum of 1-2 x/day, 30 to 90 minutes SLP Frequency: 3 to 5 out of 7 days SLP Duration/Estimated Length of Stay: 10-12 days   Team Interventions: Nursing Interventions Bladder Management, Bowel Management, Medication Management, Patient/Family Education, Disease Management/Prevention, Discharge Planning  PT interventions Ambulation/gait training, Community reintegration,  DME/adaptive equipment instruction, Neuromuscular re-education, Psychosocial support, Stair training, UE/LE Strength taining/ROM, Wheelchair propulsion/positioning, UE/LE Coordination activities, Therapeutic Activities, Skin care/wound management, Pain management, Discharge planning, Warden/ranger, Cognitive remediation/compensation, Disease management/prevention, Functional mobility training, Patient/family education, Splinting/orthotics, Therapeutic Exercise, Visual/perceptual remediation/compensation  OT Interventions Balance/vestibular training, Cognitive remediation/compensation, DME/adaptive equipment instruction, Discharge planning, Functional mobility training, Neuromuscular re-education, Psychosocial support, Patient/family education, Pain management, Self Care/advanced ADL retraining, UE/LE Strength taining/ROM, Therapeutic Exercise, Therapeutic Activities, UE/LE Coordination activities, Visual/perceptual remediation/compensation  SLP Interventions Cognitive remediation/compensation, Internal/external aids, Speech/Language facilitation, Cueing hierarchy, Therapeutic Activities, Functional tasks, Multimodal communication approach, Patient/family education, Therapeutic Exercise  TR Interventions    SW/CM Interventions Discharge Planning, Psychosocial Support, Patient/Family Education   Barriers to Discharge MD  Medical stability  Nursing Decreased caregiver support, Home environment access/layout 1 level ramped entry/3 ste bil rails w daughter;MOD I-I in ADL/IADL; daughter helps with cooking; caregiver for her mother who lives with her /planning to go to sister's home soon  PT Inaccessible home environment, Decreased caregiver support, Lack of/limited family support, Community Education Officer for SNF coverage, Weight    OT      SLP      SW Decreased caregiver support, Lack of/limited family support, Community Education Officer for SNF coverage     Team Discharge Planning: Destination: PT-Home ,OT- Home ,  SLP-Home Projected Follow-up: PT-Outpatient PT, 24 hour supervision/assistance, OT-  Home health OT, SLP-Home Health SLP, Outpatient SLP Projected Equipment Needs: PT-To be determined, OT- To be determined, SLP-None recommended by SLP Equipment Details: PT- , OT-  Patient/family involved in discharge planning: PT- Patient, Family member/caregiver,  OT-Patient, SLP-Patient  MD ELOS: 10-12 days Medical Rehab Prognosis:  Excellent Assessment: The patient has been admitted for CIR therapies with the diagnosis of CVA. The team will be addressing functional mobility, strength, stamina, balance, safety, adaptive techniques and equipment, self-care, bowel and bladder mgt, patient and caregiver education, NMR, community reentry, cognition, communication. Goals have been set at mod I to supervision with mobility and self-care and supervision to min assist with cognition and commuication. Anticipated discharge destination is home.        See Team Conference Notes for weekly updates to the plan of care

## 2023-05-15 NOTE — Evaluation (Signed)
 Speech Language Pathology Assessment and Plan  Patient Details  Name: Christine Reyes MRN: [id number] Date of Birth: 1956/04/06  SLP Diagnosis: Speech and Language deficits;Cognitive Impairments  Rehab Potential: Excellent ELOS: 10-12 days    Today's Date: 05/15/2023 SLP Individual Time: 1102-1200 SLP Individual Time Calculation (min): 58 min   Hospital Problem: Principal Problem:   Posterior circulation stroke [facility])  Past Medical History:  Past Medical History:  Diagnosis Date   Anxiety    Arthritis    Headache    Hypertension    Iron  deficiency anemia 06/17/2018   Obesity    Sleep apnea    Past Surgical History:  Past Surgical History:  Procedure Laterality Date   ABDOMINAL HYSTERECTOMY     BREAST EXCISIONAL BIOPSY Right 1989   CHOLECYSTECTOMY     COLONOSCOPY WITH ESOPHAGOGASTRODUODENOSCOPY (EGD)     csection x2     DILATION AND CURETTAGE OF UTERUS     ESOPHAGOGASTRODUODENOSCOPY (EGD) WITH PROPOFOL  N/A 12/14/2015   Procedure: ESOPHAGOGASTRODUODENOSCOPY (EGD) WITH PROPOFOL ;  Surgeon: Lamar ONEIDA Holmes, MD;  Location: [facility] ENDOSCOPY;  Service: Endoscopy;  Laterality: N/A;   GASTRIC BYPASS     JOINT REPLACEMENT     left knee surgery     TOTAL KNEE ARTHROPLASTY Right 06/22/2017   Procedure: TOTAL KNEE ARTHROPLASTY;  Surgeon: Leora Lynwood JONELLE, MD;  Location: [facility] ORS;  Service: Orthopedics;  Laterality: Right;    Assessment / Plan / Recommendation Clinical Impression HPI: 68 year old right-handed female with history significant for anxiety, hypertension, iron  deficiency anemia, obesity status post gastric bypass with BMI 39.77, quit smoking 7 years ago, right TKA 2019 complicated by DVT. Presented to [facility]/08/20[facility] after reported fall with altered mental status/headache, generalized weakness as well as blurred vision. 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 CT cervical spine negative. CTA of head and neck with no emergent large  vessel occlusion. MRI showed acute infarct in the bilateral occipital lobes, left greater than right. The bilateral occipital infarcts with foci of hemosiderin deposition possibly representing thromboemboli. Additional punctate acute infarct in the right cerebellum and possible acute infarct in the right lateral pons. Patient did not receive tPA. Echocardiogram with ejection fraction of 55 to 60% no wall motion abnormalities grade 1 diastolic dysfunction. Tolerating a regular consistency diet.   Clinical Impression:  Communication-Cognition: The Cognistat was utilized to assess cognitive-linguistic skills. Patient scored WFL on all subtests despite mild deficits in word finding and severe impairments during memory subtest. SLP observed occasional anomia during conversation and while naming abstract items. Patient able to sustain attention to task throughout evaluation, appropriately complete visual spatial puzzle and executive functioning tasks and recall cognitive/physical changes since CVA. Recommend targeting anomia during conversation and short term memory during inpatient rehab stay.  Dysarthria: Patient reports occasional dysarthria during long conversations, though none observed during evaluation (100% intelligible at conversational level). Plan to educate patient on strategies to utilize during dysarthric episodes.  Pt would benefit from skilled ST services to maximize cognition and communication in order to maximize functional independence at d/c. Anticipate patient will require 24 hour supervision at d/c and f/u SLP services.    Skilled Therapeutic Interventions          Patient evaluated using a non-standardized cognitive linguistic assessment and bedside swallow evaluation to assess current cognitive, communicative and swallowing function. See above for details.    SLP Assessment  Patient will need skilled Speech Lanaguage Pathology Services during CIR admission  Recommendations  Patient  destination: Home Follow up Recommendations: Home Health SLP;Outpatient SLP Equipment Recommended: None recommended by SLP    SLP Frequency 3 to 5 out of 7 days   SLP Duration  SLP Intensity  SLP Treatment/Interventions 68-12 days  Minumum of 1-2 x/day, 30 to 90 minutes  Cognitive remediation/compensation;Internal/external aids;Speech/Language facilitation;Cueing hierarchy;Therapeutic Activities;Functional tasks;Multimodal communication approach;Patient/family education;Therapeutic Exercise    Pain None reported   SLP Evaluation Cognition Overall Cognitive Status: Impaired/Different from baseline Arousal/Alertness: Awake/alert Orientation Level: Oriented X4 Year: 2025 Month: January Day of Week: Correct Memory: Impaired Memory Impairment: Retrieval deficit;Storage deficit Awareness: Appears intact Problem Solving: Appears intact Safety/Judgment: Appears intact  Comprehension Auditory Comprehension Overall Auditory Comprehension: Appears within functional limits for tasks assessed Reading Comprehension Interfering Components: Right neglect/inattention;Visual perceptual Expression Expression Primary Mode of Expression: Verbal Verbal Expression Overall Verbal Expression: Impaired Repetition: No impairment Naming: Impairment Confrontation: Impaired Other Naming Comments: occasional anomia during conversation Written Expression Dominant Hand: Right (writes with her L hand) Oral Motor Oral Motor/Sensory Function Overall Oral Motor/Sensory Function: Within functional limits Motor Speech Overall Motor Speech: Appears within functional limits for tasks assessed (patient reports occasional dysarthria, none observed on evaluation)  Care Tool Care Tool Cognition Ability to hear (with hearing aid or hearing appliances if normally used Ability to hear (with hearing aid or hearing appliances if normally used): 0. Adequate - no difficulty in normal conservation, social  interaction, listening to TV   Expression of Ideas and Wants Expression of Ideas and Wants: 3. Some difficulty - exhibits some difficulty with expressing needs and ideas (e.g, some words or finishing thoughts) or speech is not clear   Understanding Verbal and Non-Verbal Content Understanding Verbal and Non-Verbal Content: 3. Usually understands - understands most conversations, but misses some part/intent of message. Requires cues at times to understand  Memory/Recall Ability Memory/Recall Ability : Current season;That he or she is in a hospital/hospital unit   Short Term Goals: Week 1: SLP Short Term Goal 1 (Week 1): Patient will utilize compensatory strategies to increase word finding during conversation given min multimodal A SLP Short Term Goal 2 (Week 1): Patient will recall and utilize memory compensatory strategies given mod multimodal A SLP Short Term Goal 3 (Week 1): Patient will recall and utilize education regarding speech intelligibility strategies during instances of dysarthria given minA  Refer to Care Plan for Long Term Goals  Recommendations for other services: None   Discharge Criteria: Patient will be discharged from SLP if patient refuses treatment 3 consecutive times without medical reason, if treatment goals not met, if there is a change in medical status, if patient makes no progress towards goals or if patient is discharged from hospital.  The above assessment, treatment plan, treatment alternatives and goals were discussed and mutually agreed upon: by patient and by family  Lonnell Chaput M.A., CF-SLP 05/15/2023, 12:44 PM

## 2023-05-15 NOTE — Progress Notes (Signed)
 Inpatient Rehabilitation  Patient information reviewed and entered into eRehab system by Burnard Mealing, OTR/L, Rehab Quality Coordinator.   Information including medical coding, functional ability and quality indicators will be reviewed and updated through discharge.

## 2023-05-15 NOTE — Progress Notes (Signed)
 PROGRESS NOTE   Subjective/Complaints: Pt's headache is better. Now having pain along lower neck/traps, left knee, low back, areas she typically has problems with home. She used to take BC powder for these aches.   ROS: Patient denies fever, rash, sore throat, blurred vision, dizziness, nausea, vomiting, diarrhea, cough, shortness of breath or chest pain, headache, or mood change.    Objective:   No results found. No results for input(s): WBC, HGB, HCT, PLT in the last 72 hours. No results for input(s): NA, K, CL, CO2, GLUCOSE, BUN, CREATININE, CALCIUM  in the last 72 hours.  Intake/Output Summary (Last 24 hours) at 05/15/2023 0915 Last data filed at 05/15/2023 0718 Gross per 24 hour  Intake 420 ml  Output --  Net 420 ml        Physical Exam: Vital Signs Blood pressure 105/61, pulse 72, temperature 98 F (36.7 C), resp. rate 18, height 4' 11 (1.499 m), weight 85.5 kg, SpO2 99%.  General: Alert and oriented x 3, No apparent distress HEENT: Head is normocephalic, atraumatic, PERRLA, EOMI, sclera anicteric, oral mucosa pink and moist, dentition intact, ext ear canals clear,  Neck: Supple without JVD or lymphadenopathy Heart: Reg rate and rhythm. No murmurs rubs or gallops Chest: CTA bilaterally without wheezes, rales, or rhonchi; no distress Abdomen: Soft, non-tender, non-distended, bowel sounds positive. Extremities: No clubbing, cyanosis, or edema. Pulses are 2+ Psych: Pt's affect is appropriate. Pt is cooperative Skin: Clean and intact without signs of breakdown Neuro:  Alert and oriented x 3. Fair insight and awareness. Fair Memory. Normal language and speech. Cranial nerve exam unremarkable. MMT: grossly 2-3/5 prox to 4/5 distally in both UE's. LLE 1-2 HF, 3 KE to 4/5 distally, RLE 2+ HF, 3+ to 4 KE to 4/5 distally. Mild left PD,. Decreased FMC, mild ataxia LUE>RUE and LLE>RLE. Musculoskeletal: Both  traps tender along anterior border with palpation, left side more taut then right. SCM tight as well? Left knee tender with moderate palpation. Old TKA scars, left knee didn't seem any more swollen than right.    Assessment/Plan: 1. Functional deficits which require 3+ hours per day of interdisciplinary therapy in a comprehensive inpatient rehab setting. Physiatrist is providing close team supervision and 24 hour management of active medical problems listed below. Physiatrist and rehab team continue to assess barriers to discharge/monitor patient progress toward functional and medical goals  Care Tool:  Bathing              Bathing assist       Upper Body Dressing/Undressing Upper body dressing        Upper body assist      Lower Body Dressing/Undressing Lower body dressing            Lower body assist       Toileting Toileting    Toileting assist       Transfers Chair/bed transfer  Transfers assist           Locomotion Ambulation   Ambulation assist              Walk 10 feet activity   Assist           Walk 50  feet activity   Assist           Walk 150 feet activity   Assist           Walk 10 feet on uneven surface  activity   Assist           Wheelchair     Assist               Wheelchair 50 feet with 2 turns activity    Assist            Wheelchair 150 feet activity     Assist          Blood pressure 105/61, pulse 72, temperature 98 F (36.7 C), resp. rate 18, height 4' 11 (1.499 m), weight 85.5 kg, SpO2 99%.  Medical Problem List and Plan: 1. Functional deficits secondary to multifocal posterior circulation infarct             -patient may  shower             -ELOS/Goals: 10 to 14 days, supervision PT/OT/SLP  -Patient is beginning CIR therapies today including PT, OT, and SLP  2.  Antithrombotics: -DVT/anticoagulation:  Pharmaceutical: Lovenox              -antiplatelet  therapy: Aspirin  81 mg daily and Plavix  75 mg day x 3 weeks then aspirin  alone 3. Pain Management: Neurontin  300 mg 3 times daily -baclofen  5 mg 3 times daily muscle spasms Voltaren  4 g 4 times daily as needed--she says this doesn't help her -oxycodone  5 mg every 6 hours as needed 1/3 will schedule tylenol  650mg  TID to start, add kpad also 4. Mood/Behavior/Sleep: Trazodone  100 mg nightly.  Provide emotional support             -antipsychotic agents: N/A 5. Neuropsych/cognition: This patient is not quite capable of making decisions on her own behalf. 6. Skin/Wound Care: Routine skin checks 7. Fluids/Electrolytes/Nutrition: Routine in and outs  -encourage liquids, po intake  -labs from 12/30 without significant abnl    -recheck Monday  8.  Hyponatremia.  Follow-up chemistries 9.  Hyperlipidemia.  Lipitor 10.  Obesity.  History of gastric bypass.  BMI 39.77.  Dietary follow-up 11.  GERD.  Protonix  12.  History of iron  deficiency anemia.     -recent hgb 11.0 on 12/30--recheck Monday  -fe++ supp 13.  Permissive hypertension.  Patient on Benicar  HCT 40-25 mg tablet daily prior to admission.  Resume as needed  -bp soft despite being off all meds  -encourage fluids    LOS: 1 days A FACE TO FACE EVALUATION WAS PERFORMED  Christine Reyes 05/15/2023, 9:15 AM

## 2023-05-16 DIAGNOSIS — I6350 Cerebral infarction due to unspecified occlusion or stenosis of unspecified cerebral artery: Secondary | ICD-10-CM | POA: Diagnosis not present

## 2023-05-16 NOTE — Progress Notes (Signed)
 Physical Therapy Session Note  Patient Details  Name: [name] MRN: [id number] Date of Birth: [birth year]  Today's Date: 05/16/2023 PT Individual Time: 0804-0904 PT Individual Time Calculation (min): 60 min   Short Term Goals: Week 1:  PT Short Term Goal 1 (Week 1): Pt will perform all bed mobility with Mod I from flat mattress. PT Short Term Goal 2 (Week 1): Pt will perform all functional transfers with close supervision. PT Short Term Goal 3 (Week 1): Pt will ambulate at least 130 ft using RW with close supervision. PT Short Term Goal 4 (Week 1): Pt will perform at least 8 steps with close supervision.  Skilled Therapeutic Interventions/Progress Updates:  Patient supine in bed on entrance to room. Patient alert and agreeable to PT session.   Patient with no pain complaint at start of session.   Therapeutic Activity: Bed Mobility: Pt performed supine > sit with supervision requiring extra time and use of bed rails. No cueing required for technique.  Transfers: Pt performed sit<>stand and stand pivot transfers throughout session with CGA/ light MinA and improving to light CGA when reminded to use BUE to push from seated surface. Toilet transfer performed with light MinA for controlling descent. Pt able to manage clothing with supervision. Pericare performed with supervision.  Gait Training:  Pt ambulated 165 ft using RW with CGA. Demonstrated decreased L hip/ knee flexion and decreased dorsiflexion with foot flat positioning for foot strike to start stance phase. Pt notes L knee weakness but no buckling. Provided vc/ tc for heel-to-toe and for increased foot clearance with minimal corrections made.   Pt is able to complete with one standing rest break and covers 115 ft with gait speed of 0.10 m/s indicating that she is currently a household ambulator.   Neuromuscular Re-ed: NMR facilitated during session with focus on standing balance and proprioception. Pt guided in Berg  Balance Test.   Patient demonstrates increased fall risk as noted by score of  23 /56 on Berg Balance Scale.  (<36= high risk for falls, close to 100%; 37-45 significant >80%; 46-51 moderate >50%; 52-55 lower >25%) Pt demos most difficulty with more dynamic aspects of testing and requires UE support for all stepping/ positioning.   NMR performed for improvements in motor control and coordination, balance, sequencing, judgement, and self confidence/ efficacy in performing all aspects of mobility at highest level of independence.   Patient feeling fatigued and supine in bed at end of session with brakes locked, bed alarm set, and all needs within reach.   Therapy Documentation Precautions:  Precautions Precautions: Fall Precaution Comments: L hemipareisis, intermittent LLE pain with open-chain movement Restrictions Weight Bearing Restrictions Per Provider Order: No  Pain:  Pain related in Bil neck/ shoulders when rising from supine to sit, but decreases with movement and sitting in seated position.   Therapy/Group: Individual Therapy  [name] PT, DPT, CSRS 05/16/2023, 12:15 PM

## 2023-05-16 NOTE — Plan of Care (Signed)
  Problem: RH Balance Goal: LTG Patient will maintain dynamic standing balance (PT) Description: LTG:  Patient will maintain dynamic standing balance with assistance during mobility activities (PT) Flowsheets (Taken 05/16/2023 0611) LTG: Pt will maintain dynamic standing balance during mobility activities with:: Independent with assistive device    Problem: RH Bed Mobility Goal: LTG Patient will perform bed mobility with assist (PT) Description: LTG: Patient will perform bed mobility with assistance, with/without cues (PT). Flowsheets (Taken 05/16/2023 [phone]) LTG: Pt will perform bed mobility with assistance level of: Independent   Problem: RH Bed to Chair Transfers Goal: LTG Patient will perform bed/chair transfers w/assist (PT) Description: LTG: Patient will perform bed to chair transfers with assistance (PT). Flowsheets (Taken 05/16/2023 [phone]) LTG: Pt will perform Bed to Chair Transfers with assistance level: Independent with assistive device    Problem: RH Car Transfers Goal: LTG Patient will perform car transfers with assist (PT) Description: LTG: Patient will perform car transfers with assistance (PT). Flowsheets (Taken 05/16/2023 [phone]) LTG: Pt will perform car transfers with assist:: Supervision/Verbal cueing   Problem: RH Furniture Transfers Goal: LTG Patient will perform furniture transfers w/assist (OT/PT) Description: LTG: Patient will perform furniture transfers  with assistance (OT/PT). Flowsheets (Taken 05/16/2023 [phone]) LTG: Pt will perform furniture transfers with assist:: Independent with assistive device    Problem: RH Ambulation Goal: LTG Patient will ambulate in controlled environment (PT) Description: LTG: Patient will ambulate in a controlled environment, # of feet with assistance (PT). Flowsheets (Taken 05/16/2023 [phone]) LTG: Pt will ambulate in controlled environ  assist needed:: Independent with assistive device LTG: Ambulation distance in controlled environment: at least  150 ft using LRAD Goal: LTG Patient will ambulate in home environment (PT) Description: LTG: Patient will ambulate in home environment, # of feet with assistance (PT). Flowsheets (Taken 05/16/2023 [phone]) LTG: Pt will ambulate in home environ  assist needed:: Independent with assistive device LTG: Ambulation distance in home environment: at least 50 ft per bout using LRAD   Problem: RH Stairs Goal: LTG Patient will ambulate up and down stairs w/assist (PT) Description: LTG: Patient will ambulate up and down # of stairs with assistance (PT) Flowsheets (Taken 05/16/2023 0611) LTG: Pt will ambulate up/down stairs assist needed:: Supervision/Verbal cueing LTG: Pt will  ambulate up and down number of stairs: at least 4 steps with HR setup as per home environment

## 2023-05-16 NOTE — Progress Notes (Signed)
 PROGRESS NOTE   Subjective/Complaints:  Pt doing well today, slept well, pain well controlled, LBM this morning, urinating fine. Mentions very infrequent nausea if she drinks too much liquid, but small sips fine, no nausea currently; denies any other complaints or concerns today.    ROS: as per HPI. Denies CP, SOB, abd pain, N/V/D/C, or any other complaints at this time.     Objective:   No results found. No results for input(s): WBC, HGB, HCT, PLT in the last 72 hours. No results for input(s): NA, K, CL, CO2, GLUCOSE, BUN, CREATININE, CALCIUM  in the last 72 hours.  Intake/Output Summary (Last 24 hours) at 05/16/2023 0818 Last data filed at 05/16/2023 0725 Gross per 24 hour  Intake 700 ml  Output --  Net 700 ml        Physical Exam: Vital Signs Blood pressure 111/72, pulse 76, temperature 98.1 F (36.7 C), resp. rate 18, height 4' 11 (1.499 m), weight 85.5 kg, SpO2 100%.  General: Alert and oriented x 3, No apparent distress, laying in bed.  HEENT: Head is normocephalic, atraumatic, PERRLA, EOMI, sclera anicteric, oral mucosa pink and moist, dentition intact  Neck: Supple without JVD or lymphadenopathy Heart: Reg rate and rhythm. No murmurs rubs or gallops Chest: CTA bilaterally without wheezes, rales, or rhonchi; no distress Abdomen: Soft, non-tender, non-distended, bowel sounds positive. Extremities: No clubbing, cyanosis, or edema. Pulses are 2+ Psych: Pt's affect is appropriate. Pt is cooperative Skin: Clean and intact without signs of breakdown over exposed surfaces  PRIOR EXAMS: Neuro:  Alert and oriented x 3. Fair insight and awareness. Fair Memory. Normal language and speech. Cranial nerve exam unremarkable. MMT: grossly 2-3/5 prox to 4/5 distally in both UE's. LLE 1-2 HF, 3 KE to 4/5 distally, RLE 2+ HF, 3+ to 4 KE to 4/5 distally. Mild left PD,. Decreased FMC, mild ataxia LUE>RUE and  LLE>RLE. Musculoskeletal: Both traps tender along anterior border with palpation, left side more taut then right. SCM tight as well? Left knee tender with moderate palpation. Old TKA scars, left knee didn't seem any more swollen than right.    Assessment/Plan: 1. Functional deficits which require 3+ hours per day of interdisciplinary therapy in a comprehensive inpatient rehab setting. Physiatrist is providing close team supervision and 24 hour management of active medical problems listed below. Physiatrist and rehab team continue to assess barriers to discharge/monitor patient progress toward functional and medical goals  Care Tool:  Bathing    Body parts bathed by patient: Left arm, Right arm, Chest, Abdomen, Front perineal area, Buttocks, Right upper leg, Face, Left upper leg   Body parts bathed by helper: Right lower leg, Left lower leg     Bathing assist Assist Level: Minimal Assistance - Patient > 75%     Upper Body Dressing/Undressing Upper body dressing   What is the patient wearing?: Bra, Pull over shirt    Upper body assist Assist Level: Supervision/Verbal cueing    Lower Body Dressing/Undressing Lower body dressing      What is the patient wearing?: Underwear/pull up, Pants     Lower body assist Assist for lower body dressing: Contact Guard/Touching assist     Toileting  Toileting    Toileting assist Assist for toileting: Minimal Assistance - Patient > 75%     Transfers Chair/bed transfer  Transfers assist     Chair/bed transfer assist level: Minimal Assistance - Patient > 75%     Locomotion Ambulation   Ambulation assist      Assist level: Minimal Assistance - Patient > 75% Assistive device: Walker-rolling Max distance: 75 ft   Walk 10 feet activity   Assist     Assist level: Contact Guard/Touching assist Assistive device: Walker-rolling   Walk 50 feet activity   Assist    Assist level: Minimal Assistance - Patient >  75% Assistive device: Walker-rolling    Walk 150 feet activity   Assist Walk 150 feet activity did not occur: Safety/medical concerns         Walk 10 feet on uneven surface  activity   Assist Walk 10 feet on uneven surfaces activity did not occur: Safety/medical concerns         Wheelchair     Assist Is the patient using a wheelchair?: Yes Type of Wheelchair: Manual    Wheelchair assist level: Dependent - Patient 0% Max wheelchair distance: 300 ft    Wheelchair 50 feet with 2 turns activity    Assist        Assist Level: Dependent - Patient 0%   Wheelchair 150 feet activity     Assist      Assist Level: Dependent - Patient 0%   Blood pressure 111/72, pulse 76, temperature 98.1 F (36.7 C), resp. rate 18, height 4' 11 (1.499 m), weight 85.5 kg, SpO2 100%.  Medical Problem List and Plan: 1. 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  Antithrombotics: -DVT/anticoagulation:  Pharmaceutical: Lovenox  40mg  daily -antiplatelet therapy: Aspirin  81 mg daily and Plavix  75 mg day x 3 weeks then aspirin  alone 3. Pain Management: Neurontin  300 mg 3 times daily -baclofen  5 mg 3 times daily prn muscle spasms -Voltaren  4 g 4 times daily as needed--she says this doesn't help her -oxycodone  5 mg every 6 hours as needed -1/3 will schedule tylenol  650mg  TID to start, add kpad also 4. Mood/Behavior/Sleep: Trazodone  100 mg nightly.  Provide emotional support             -antipsychotic agents: N/A 5. Neuropsych/cognition: This patient is not quite capable of making decisions on her own behalf. 6. Skin/Wound Care: Routine skin checks 7. Fluids/Electrolytes/Nutrition: Routine in and outs  -encourage liquids, po intake  -labs from 12/30 without significant abnl    -recheck Monday  8.  Hyponatremia.  Follow-up  chemistries 9.  Hyperlipidemia.  Lipitor 40mg  daily 10.  Obesity.  History of gastric bypass.  BMI 39.77.  Dietary follow-up 11.  GERD.  Protonix  40mg  daily 12.  History of iron  deficiency anemia.     -recent hgb 11.0 on 12/30--recheck Monday  -fe++ supp 13.  Permissive hypertension.  Patient on Benicar  HCT 40-25 mg tablet daily prior to admission.  Resume as needed  -bp soft despite being off all meds  -encourage fluids  -05/16/23 BPs improving, cont monitoring Vitals:   05/14/23 1436 05/14/23 1947 05/15/23 0525 05/15/23 1251  BP: 113/66 128/61 105/61 125/76   05/15/23 1950 05/16/23 0516  BP: (!) 123/53 111/72  LOS: 2 days A FACE TO FACE EVALUATION WAS PERFORMED  [address] Tanasia Budzinski 05/16/2023, 8:18 AM

## 2023-05-16 NOTE — Progress Notes (Signed)
 Speech Language Pathology Daily Session Note  Patient Details  Name: LAJEANA STROUGH MRN: [id number] Date of Birth: 03-Feb-1956  Today's Date: 05/16/2023 SLP Individual Time: 1001-1100 SLP Individual Time Calculation (min): 59 min  Short Term Goals: Week 1: SLP Short Term Goal 1 (Week 1): Patient will utilize compensatory strategies to increase word finding during conversation given min multimodal A SLP Short Term Goal 2 (Week 1): Patient will recall and utilize memory compensatory strategies given mod multimodal A SLP Short Term Goal 3 (Week 1): Patient will recall and utilize education regarding speech intelligibility strategies during instances of dysarthria given minA  Skilled Therapeutic Interventions: Skilled therapy session focused on cognitive goals. SLP faciliated session by educating patient on WRAP (write, repeat, associate, picture) memory strategies. Patient identified examples of each strategy given minA. SLP continued to address memory skills through completion of paragraph comprehension task. Patient/SLP utilized [employer] memory strategies during task by creating visual images and repeating information in paragraph. Immediately after SLP read paragraph, patient answered comprehenion questions with 70% accuracy given minA. After a 5 minute delay, patient answered questions with 60% accuracy. At the end of the session, patient recalled 3/4 memory strategies with minA. Patient left in bed with alarm set and call bell in reach. Continue POC.   Pain Pain in shoulders   Therapy/Group: Individual Therapy  Berkleigh Beckles M.A., CF-SLP 05/16/2023, 7:33 AM

## 2023-05-17 DIAGNOSIS — I6350 Cerebral infarction due to unspecified occlusion or stenosis of unspecified cerebral artery: Secondary | ICD-10-CM | POA: Diagnosis not present

## 2023-05-17 DIAGNOSIS — I10 Essential (primary) hypertension: Secondary | ICD-10-CM | POA: Diagnosis not present

## 2023-05-17 NOTE — Progress Notes (Signed)
 PROGRESS NOTE   Subjective/Complaints:  Christine Reyes doing well again today, slept well, pain well controlled, LBM just prior to eval, urinating fine. Denies any other complaints or concerns today.    ROS: as per HPI. Denies CP, SOB, abd pain, N/V/D/C, or any other complaints at this time.     Objective:   No results found. No results for input(s): WBC, HGB, HCT, PLT in the last 72 hours. No results for input(s): NA, K, CL, CO2, GLUCOSE, BUN, CREATININE, CALCIUM  in the last 72 hours.  Intake/Output Summary (Last 24 hours) at 05/17/2023 1022 Last data filed at 05/17/2023 0735 Gross per 24 hour  Intake 731 ml  Output --  Net 731 ml        Physical Exam: Vital Signs Blood pressure 119/75, pulse 74, temperature 98 F (36.7 C), resp. rate 18, height 4' 11 (1.499 m), weight 85.5 kg, SpO2 92%.  General: Alert and oriented x 3, No apparent distress, laying in bed looking at her phone. HEENT: Head is normocephalic, atraumatic, PERRLA, EOMI, sclera anicteric, oral mucosa pink and moist, dentition intact  Neck: Supple without JVD or lymphadenopathy Heart: Reg rate and rhythm. No murmurs rubs or gallops Chest: CTA bilaterally without wheezes, rales, or rhonchi; no distress Abdomen: Soft, non-tender, non-distended, bowel sounds positive. Extremities: No clubbing, cyanosis, or edema. Pulses are 2+ Psych: Christine Reyes's affect is appropriate. Christine Reyes is cooperative Skin: Clean and intact without signs of breakdown over exposed surfaces  PRIOR EXAMS: Neuro:  Alert and oriented x 3. Fair insight and awareness. Fair Memory. Normal language and speech. Cranial nerve exam unremarkable. MMT: grossly 2-3/5 prox to 4/5 distally in both UE's. LLE 1-2 HF, 3 KE to 4/5 distally, RLE 2+ HF, 3+ to 4 KE to 4/5 distally. Mild left PD,. Decreased FMC, mild ataxia LUE>RUE and LLE>RLE. Musculoskeletal: Both traps tender along anterior border with  palpation, left side more taut then right. SCM tight as well? Left knee tender with moderate palpation. Old TKA scars, left knee didn't seem any more swollen than right.    Assessment/Plan: 1. Functional deficits which require 3+ hours per day of interdisciplinary therapy in a comprehensive inpatient rehab setting. Physiatrist is providing close team supervision and 24 hour management of active medical problems listed below. Physiatrist and rehab team continue to assess barriers to discharge/monitor patient progress toward functional and medical goals  Care Tool:  Bathing    Body parts bathed by patient: Left arm, Right arm, Chest, Abdomen, Front perineal area, Buttocks, Right upper leg, Face, Left upper leg   Body parts bathed by helper: Right lower leg, Left lower leg     Bathing assist Assist Level: Minimal Assistance - Patient > 75%     Upper Body Dressing/Undressing Upper body dressing   What is the patient wearing?: Bra, Pull over shirt    Upper body assist Assist Level: Supervision/Verbal cueing    Lower Body Dressing/Undressing Lower body dressing      What is the patient wearing?: Underwear/pull up, Pants     Lower body assist Assist for lower body dressing: Contact Guard/Touching assist     Toileting Toileting    Toileting assist Assist for toileting: Minimal Assistance -  Patient > 75%     Transfers Chair/bed transfer  Transfers assist     Chair/bed transfer assist level: Minimal Assistance - Patient > 75%     Locomotion Ambulation   Ambulation assist      Assist level: Minimal Assistance - Patient > 75% Assistive device: Walker-rolling Max distance: 75 ft   Walk 10 feet activity   Assist     Assist level: Contact Guard/Touching assist Assistive device: Walker-rolling   Walk 50 feet activity   Assist    Assist level: Minimal Assistance - Patient > 75% Assistive device: Walker-rolling    Walk 150 feet activity   Assist Walk  150 feet activity did not occur: Safety/medical concerns         Walk 10 feet on uneven surface  activity   Assist Walk 10 feet on uneven surfaces activity did not occur: Safety/medical concerns         Wheelchair     Assist Is the patient using a wheelchair?: Yes Type of Wheelchair: Manual    Wheelchair assist level: Dependent - Patient 0% Max wheelchair distance: 300 ft    Wheelchair 50 feet with 2 turns activity    Assist        Assist Level: Dependent - Patient 0%   Wheelchair 150 feet activity     Assist      Assist Level: Dependent - Patient 0%   Blood pressure 119/75, pulse 74, temperature 98 F (36.7 C), resp. rate 18, height 4' 11 (1.499 m), weight 85.5 kg, SpO2 92%.  Medical Problem List and Plan: 1. 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  Antithrombotics: -DVT/anticoagulation:  Pharmaceutical: Lovenox  40mg  daily -antiplatelet therapy: Aspirin  81 mg daily and Plavix  75 mg day x 3 weeks then aspirin  alone 3. Pain Management: Neurontin  300 mg 3 times daily -baclofen  5 mg 3 times daily prn muscle spasms -Voltaren  4 g 4 times daily as needed--she says this doesn't help her -oxycodone  5 mg every 6 hours as needed -1/3 will schedule tylenol  650mg  TID to start, add kpad also 4. Mood/Behavior/Sleep: Trazodone  100 mg nightly.  Provide emotional support             -antipsychotic agents: N/A 5. Neuropsych/cognition: This patient is not quite capable of making decisions on her own behalf. 6. Skin/Wound Care: Routine skin checks 7. Fluids/Electrolytes/Nutrition: Routine in and outs  -encourage liquids, po intake  -labs from 12/30 without significant abnl    -recheck Monday  8.  Hyponatremia.  Follow-up chemistries 9.  Hyperlipidemia.  Lipitor 40mg  daily 10.  Obesity.  History of gastric bypass.   BMI 39.77.  Dietary follow-up 11.  GERD.  Protonix  40mg  daily 12.  History of iron  deficiency anemia.     -recent hgb 11.0 on 12/30--recheck Monday  -fe++ supp 13.  Permissive hypertension.  Patient on Benicar  HCT 40-25 mg tablet daily prior to admission.  Resume as needed  -bp soft despite being off all meds  -encourage fluids  -1/4-5/25 BPs improving, cont monitoring Vitals:   05/14/23 1436 05/14/23 1947 05/15/23 0525 05/15/23 1251  BP: 113/66 128/61 105/61 125/76   05/15/23 1950 05/16/23 0516 05/16/23 1500 05/16/23 2020  BP: (!) 123/53 111/72 117/73 109/65   05/17/23 0448  BP: 119/75  LOS: 3 days A FACE TO FACE EVALUATION WAS PERFORMED  [address] 05/17/2023, 10:22 AM

## 2023-05-18 ENCOUNTER — Inpatient Hospital Stay (HOSPITAL_COMMUNITY): Payer: 59

## 2023-05-18 DIAGNOSIS — M25811 Other specified joint disorders, right shoulder: Secondary | ICD-10-CM

## 2023-05-18 DIAGNOSIS — I6350 Cerebral infarction due to unspecified occlusion or stenosis of unspecified cerebral artery: Secondary | ICD-10-CM | POA: Diagnosis not present

## 2023-05-18 DIAGNOSIS — I10 Essential (primary) hypertension: Secondary | ICD-10-CM | POA: Diagnosis not present

## 2023-05-18 LAB — CBC WITH DIFFERENTIAL/PLATELET
Abs Immature Granulocytes: 0.02 10*3/uL (ref 0.00–0.07)
Basophils Absolute: 0 10*3/uL (ref 0.0–0.1)
Basophils Relative: 1 %
Eosinophils Absolute: 0.1 10*3/uL (ref 0.0–0.5)
Eosinophils Relative: 2 %
HCT: 33.5 % — ABNORMAL LOW (ref 36.0–46.0)
Hemoglobin: 10.7 g/dL — ABNORMAL LOW (ref 12.0–15.0)
Immature Granulocytes: 0 %
Lymphocytes Relative: 42 %
Lymphs Abs: 2 10*3/uL (ref 0.7–4.0)
MCH: 31.7 pg (ref 26.0–34.0)
MCHC: 31.9 g/dL (ref 30.0–36.0)
MCV: 99.1 fL (ref 80.0–100.0)
Monocytes Absolute: 0.4 10*3/uL (ref 0.1–1.0)
Monocytes Relative: 9 %
Neutro Abs: 2.2 10*3/uL (ref 1.7–7.7)
Neutrophils Relative %: 46 %
Platelets: 264 10*3/uL (ref 150–400)
RBC: 3.38 MIL/uL — ABNORMAL LOW (ref 3.87–5.11)
RDW: 12.4 % (ref 11.5–15.5)
WBC: 4.7 10*3/uL (ref 4.0–10.5)
nRBC: 0 % (ref 0.0–0.2)

## 2023-05-18 LAB — COMPREHENSIVE METABOLIC PANEL
ALT: 17 U/L (ref 0–44)
AST: 20 U/L (ref 15–41)
Albumin: 2.7 g/dL — ABNORMAL LOW (ref 3.5–5.0)
Alkaline Phosphatase: 120 U/L (ref 38–126)
Anion gap: 8 (ref 5–15)
BUN: 15 mg/dL (ref 8–23)
CO2: 23 mmol/L (ref 22–32)
Calcium: 8.3 mg/dL — ABNORMAL LOW (ref 8.9–10.3)
Chloride: 108 mmol/L (ref 98–111)
Creatinine, Ser: 1 mg/dL (ref 0.44–1.00)
GFR, Estimated: >60 mL/min (ref >60)
Glucose, Bld: 76 mg/dL (ref 70–99)
Potassium: 4.4 mmol/L (ref 3.5–5.1)
Sodium: 139 mmol/L (ref 135–145)
Total Bilirubin: 0.4 mg/dL (ref 0.0–1.2)
Total Protein: 6 g/dL — ABNORMAL LOW (ref 6.5–8.1)

## 2023-05-18 MED ORDER — MUSCLE RUB 10-15 % EX CREA
TOPICAL_CREAM | CUTANEOUS | Status: DC | PRN
  Administered 2023-05-19 – 2023-05-25 (×5): 1 via TOPICAL
  Filled 2023-05-18 (×4): qty 85

## 2023-05-18 NOTE — Care Management (Signed)
 [facility] Individual Statement of Services  Patient Name:  Christine Reyes  Date:  05/18/2023  Welcome to the [facility]patient [facility].  Our goal is to provide you with an individualized program based on your diagnosis and situation, designed to meet your specific needs.  With this comprehensive rehabilitation program, you will be expected to participate in at least 3 hours of rehabilitation therapies Monday-Friday, with modified therapy programming on the weekends.  Your rehabilitation program will include the following services:  Physical Therapy (PT), Occupational Therapy (OT), Speech Therapy (ST), 24 hour per day rehabilitation nursing, Therapeutic Recreaction (TR), Psychology, Neuropsychology, Care Coordinator, Rehabilitation Medicine, Nutrition Services, Pharmacy Services, and Other  Weekly team conferences will be held on Tuesdays to discuss your progress.  Your Inpatient Rehabilitation Care Coordinator will talk with you frequently to get your input and to update you on team discussions.  Team conferences with you and your family in attendance may also be held.  Expected length of stay: 10-14 days    Overall anticipated outcome: Independent with an Assistive Device  Depending on your progress and recovery, your program may change. Your Inpatient Rehabilitation Care Coordinator will coordinate services and will keep you informed of any changes. Your Inpatient Rehabilitation Care Coordinator's name and contact numbers are listed  below.  The following services may also be recommended but are not provided by the Inpatient [facility]:  Driving Evaluations Home Health Rehabiltiation Services Outpatient Rehabilitation Services Vocational Rehabilitation   Arrangements will be made to provide these services after discharge if needed.  Arrangements include referral to agencies that provide these services.  Your insurance has been verified to be:  [facility]  Medicare  Your primary doctor is:  Office Manager  Pertinent information will be shared with your doctor and your insurance company.  Inpatient Rehabilitation Care Coordinator:  Graeme Feliciana SILK [phone] or ([phone]  Information discussed with and copy given to patient by: Graeme DELENA Feliciana, 05/18/2023, 7:51 AM

## 2023-05-18 NOTE — Progress Notes (Signed)
 Physical Therapy Session Note  Patient Details  Name: Christine Reyes MRN: [id number] Date of Birth: July 22, 1955  Today's Date: 05/18/2023 PT Individual Time: 1120-1207 PT Individual Time Calculation (min): 47 min   Short Term Goals: Week 1:  PT Short Term Goal 1 (Week 1): Pt will perform all bed mobility with Mod I from flat mattress. PT Short Term Goal 2 (Week 1): Pt will perform all functional transfers with close supervision. PT Short Term Goal 3 (Week 1): Pt will ambulate at least 130 ft using RW with close supervision. PT Short Term Goal 4 (Week 1): Pt will perform at least 8 steps with close supervision.  Skilled Therapeutic Interventions/Progress Updates:  Patient supine in bed on entrance to room. Patient alert and agreeable to PT session.   Patient with no pain complaint at start of session.  Therapeutic Activity: Bed Mobility: Pt performed supine > sit with supervision and requiring extra time and effort. VC/ tc required for lean of head to L to bring pt's balance to more upright seated position. Transfers: Pt performed sit<>stand and stand pivot transfers throughout session with CGA/ supervision. Provided vc and NMR training to decrease hand use and rely on BLE to push to stand. Improves to supervision.    Gait Training:  Pt ambulated ~150 ft using RW with close supervision/ CGA. Demonstrated decreased L step height/ length. Provided vc/ tc for increased focus to LLE during ambulation. Almost to main gym when pt relates need to toilet. Return ambulates to room with similar quality of gait. Toilet transfer performed with distant supervision. Is able to toilet, provided with washcloth for pericare and performs with supervision. Manages clothing with supervision/ ModI. Then pt able to return ambulate >200 ft to main therapy gym.   Neuromuscular Re-ed: NMR facilitated during session with focus on standing balance, motor control, proprioception. Pt guided in sit<>stand and stand  pivot technique without use of RW. Demos reduced self-efficacy. Blocked practice of sit<>stand from low mat table. Pt guided in proper technique with posterior foot placement and forward hip hinge, and hands on knees for increased UE assist. Is able to improve to supervision to no AD. Stand pivot with pt initially reaching out to therapist's arm or to armrest of chair. Pt is able to improve to completing with no UE use. Then provided pt with low height rolling stool and is able to descend to sit and with increased vc/ tc, is able to rise to stand with some movement to stool but no LOB.   NMR performed for improvements in motor control and coordination, balance, sequencing, judgement, and self confidence/ efficacy in performing all aspects of mobility at highest level of independence.   Patient seated upright in recliner at end of session with brakes locked, belt alarm set, and all needs within reach.  Therapy Documentation Precautions:  Precautions Precautions: Fall Precaution Comments: L hemipareisis, intermittent LLE pain with open-chain movement Restrictions Weight Bearing Restrictions Per Provider Order: No  Pain: L shoulder pain related with time in ambulation. Addressed with repositioning and use of thermotherapy.   Therapy/Group: Individual Therapy  [name] PT, DPT, CSRS 05/18/2023, 12:55 PM

## 2023-05-18 NOTE — Progress Notes (Signed)
 Speech Language Pathology Daily Session Note  Patient Details  Name: Christine Reyes MRN: [id number] Date of Birth: Oct 03, 1955  Today's Date: 05/18/2023 SLP Individual Time: 1001-1100 SLP Individual Time Calculation (min): 59 min  Short Term Goals: Week 1: SLP Short Term Goal 1 (Week 1): Patient will utilize compensatory strategies to increase word finding during conversation given min multimodal A SLP Short Term Goal 2 (Week 1): Patient will recall and utilize memory compensatory strategies given mod multimodal A SLP Short Term Goal 3 (Week 1): Patient will recall and utilize education regarding speech intelligibility strategies during instances of dysarthria given minA  Skilled Therapeutic Interventions:  Patient was seen in am to address cognitive re- training and word finding. Upon SLP arrival, pt was alert and seated upright in recliner. She endorsed pain in arm however she was agreeable for continuation session. Pt expressed PLOF and recent medical hx indep. Despite independence with narrative she endorsed word finding difficulty. SLP introduced and instructed pt in circumlocution strategies. During a structured task, pt adequately utilized strategy with sup A. In other minutes of session, SLP addressed memory through review of WRAP compensatory strategies and examples of utilization within current environment. Pt was subsequently challenged in moderate level paragraph retention with SLP guidance in utilization of repetition and association strategies. After a 6 minute distracted delay, pt recalled information with 100% acc. Task advanced to paragraph retention of complex information pt indep requesting assist to facilitate recall of information. Task also advanced as time interval was increased to a 15 minute distracted delay. Pt subsequently recalled information with 80% acc indep which did not improve with additional cues. At conclusion of session, pt was left seated upright in recliner with  chair alarm active and call button within reach. SLP to continue POC.   Pain Pain Assessment Pain Scale: 0-10 Pain Score: 8  Pain Type: Acute pain Pain Location: Arm Pain Orientation: Right Pain Descriptors / Indicators: Aching 2nd Pain Site Pain Intervention(s): Distraction  Therapy/Group: Individual Therapy  Joane GORMAN Fuss 05/18/2023, 12:47 PM

## 2023-05-18 NOTE — Progress Notes (Signed)
 Occupational Therapy Session Note  Patient Details  Name: Christine Reyes MRN: [id number] Date of Birth: Oct 20, 1955  Today's Date: 05/18/2023 OT Individual Time: 0902-1000 OT Individual Time Calculation (min): 58 min    Short Term Goals: Week 1:  OT Short Term Goal 1 (Week 1): pt will be able to don underwear and pants with supervision only. OT Short Term Goal 2 (Week 1): Pt will be able to don socks and shoes with min A. OT Short Term Goal 3 (Week 1): Pt will ambulate to the toilet with RW with supervision. OT Short Term Goal 4 (Week 1): Pt will be able to engage in a room HEP using light theraband.  Skilled Therapeutic Interventions/Progress Updates:     Pt received semi-reclined in bed presenting to be in good spirits receptive to skilled OT session reporting 8/10 pain in R knee and shoulder with RN providing medications prior to OT session- OT offering intermittent rest breaks, repositioning, and therapeutic support to optimize participation in therapy session. Pt requesting to take shower this AM. Pt transitioned to EOB and completing functional mobility throughout room using RW to retrieve clothing with CGA. Pt ambulated to BR and transferred to walk-in shower with CGA using RW. Pt doffed clothing in seated position with supervision. Engaged Pt in completing U/LB bathing in standing position for increased balance and activity tolerance challenge. Pt able to manipulate [facility] and complete UB bathing with close supervision using grab bars for balance. Pt completed majority of LB bathing in standing while holding grab bar close supervision and sat to wash lower B LEs for energy conservation. Following shower, Pt dried self in seated position supervision. Pt ambulated to bedroom and sat EOB for U/LB dressing tasks. Pt able to donn clean shirt while seated and reach towards ground to weave B LEs into underwear/pants with supervision with no LOB. Pt stood while using RW to bring pants to waist CGA.  Donned socks and shoes total A for time management- education provided on sock aid with Pt reporting she utilized one at baseline to increase independence and she is planning to use one at d/c. Engaged Pt in completing functional mobility through busy hallway using RW ~165ft x2 trials for endurance and functional mobility training. Pt initially presenting with at step-to gait pattern requiring max verbal and tactile cues to facilitate improved lateral weight shifting, leading each step with heel, and increase step-through gait pattern for improved fluidity and safety during functional mobility. Pt reporting pain in R should with unknown etiology. Pt able to complete full AROM, however pain reported when flexing shoulder >90 degrees with potential impingement noted. Engaged Pt in completing AAROM of R UE moving through internal/external rotation, supination/pronation, elbow flexion/extension, scapular protraction/retraction, and shoulder flexion to 90 degree with min improvement in pain symptoms reported following. Applied heated k-pad to Pt's R shoulder at end of session for pain management. Pt was left resting in recliner with call bell in reach, seatbelt alarm on, and all needs met.    Therapy Documentation Precautions:  Precautions Precautions: Fall Precaution Comments: L hemipareisis, intermittent LLE pain with open-chain movement Restrictions Weight Bearing Restrictions Per Provider Order: No   Therapy/Group: Individual Therapy  Christine Reyes 05/18/2023, 7:59 AM

## 2023-05-18 NOTE — Progress Notes (Signed)
 Patient ID: Christine Reyes, female   DOB: 1955-08-27, 68 y.o.   MRN: [id number]  [id number] spoke with pt dtr [name] to inform on ELOS, and once d/c date in place will schedule family edu. Aware SW will follow-up after team conference.   Graeme [name], MSW, LCSW Office: [phone] Cell: [phone] Fax: [phone]

## 2023-05-18 NOTE — Progress Notes (Signed)
 Physical Therapy Session Note  Patient Details  Name: Christine Reyes MRN: [id number] Date of Birth: Oct 05, 1955  Today's Date: 05/18/2023 PT Individual Time: 8581-8553 PT Individual Time Calculation (min): 28 min  And Today's Date: 05/18/2023 PT Missed Time: 17 Minutes Missed Time Reason: Pain;Patient fatigue  Short Term Goals: Week 1:  PT Short Term Goal 1 (Week 1): Pt will perform all bed mobility with Mod I from flat mattress. PT Short Term Goal 2 (Week 1): Pt will perform all functional transfers with close supervision. PT Short Term Goal 3 (Week 1): Pt will ambulate at least 130 ft using RW with close supervision. PT Short Term Goal 4 (Week 1): Pt will perform at least 8 steps with close supervision.  Skilled Therapeutic Interventions/Progress Updates:  Patient supine in bed on entrance to room and covered with blankets. Patient initially asleep but easily roused, then alert and not initially agreeable to PT session d/t L shoulder pain and increased fatigue.  Therapeutic Activity: K-pad adjusted to L shoulder, chart review for pt's medication list per request. Is upset with long list of medications. Related to pt that at least 3 are vitamins/ supplements.   Pt relates that she needs to toilet.  Bed Mobility: Pt performed supine <> sit with supervision. No vc required for technique.  Transfers: Pt performed sit<>stand, stand pivot, and toilet transfers throughout session with supervision. Ambulatory transfer to toilet with close supervision. Provided verbal cues for hand positioning. Again toilets with overall supervision and provided with washcloth for pericare and manages clothing with supervision. Intermittent use of RW to steady self.   Relates increased painin L shoulder and requests pain medication. Informed nursing. Provided pt with education re: increased use of BUE to hold pt up when ambulating. Will need to focus on reducing UE pressure into RW in order to better manage  shoulder use/ pain.   Patient supine in bed at end of session with brakes locked, bed alarm set, and all needs within reach.   Therapy Documentation Precautions:  Precautions Precautions: Fall Precaution Comments: L hemipareisis, intermittent LLE pain with open-chain movement Restrictions Weight Bearing Restrictions Per Provider Order: No   Therapy/Group: Individual Therapy  [name] PT, DPT, CSRS 05/18/2023, 2:25 PM

## 2023-05-18 NOTE — Progress Notes (Signed)
 PROGRESS NOTE   Subjective/Complaints:  RIght shoulder pain mainly when elevating the Right arm , fell at CVA onset at home, no prior shoulder issues , also c/o lower posterior cervical pain   ROS: as per HPI. Denies CP, SOB, abd pain, N/V/D/C, or any other complaints at this time.     Objective:   No results found. Recent Labs    05/18/23 0522  WBC 4.7  HGB 10.7*  HCT 33.5*  PLT 264   Recent Labs    05/18/23 0522  NA 139  K 4.4  CL 108  CO2 23  GLUCOSE 76  BUN 15  CREATININE 1.00  CALCIUM  8.3*    Intake/Output Summary (Last 24 hours) at 05/18/2023 0819 Last data filed at 05/17/2023 1814 Gross per 24 hour  Intake 708 ml  Output --  Net 708 ml        Physical Exam: Vital Signs Blood pressure 110/66, pulse 71, temperature 98 F (36.7 C), resp. rate 17, height 4' 11 (1.499 m), weight 85.5 kg, SpO2 97%.  General: No acute distress Mood and affect are appropriate Heart: Regular rate and rhythm no rubs murmurs or extra sounds Lungs: Clear to auscultation, breathing unlabored, no rales or wheezes Abdomen: Positive bowel sounds, soft nontender to palpation, nondistended Extremities: No clubbing, cyanosis, or edema Skin: No evidence of breakdown, no evidence of rash  PRIOR EXAMS: Neuro:  Alert and oriented x 3. Fair insight and awareness. Fair Memory. Normal language and speech. Cranial nerve exam unremarkable. MMT: 4/5 in bilateral Delt bi, tri, 2- HF 3- KE 4/5 B ADF and PF  Decreased FMC, mild ataxia LUE>RUE and LLE>RLE. Musculoskeletal: Both traps tender along anterior border with palpation, left side > right  + impingement sign in Right shoulder    Assessment/Plan: 1. Functional deficits which require 3+ hours per day of interdisciplinary therapy in a comprehensive inpatient rehab setting. Physiatrist is providing close team supervision and 24 hour management of active medical problems listed  below. Physiatrist and rehab team continue to assess barriers to discharge/monitor patient progress toward functional and medical goals  Care Tool:  Bathing    Body parts bathed by patient: Left arm, Right arm, Chest, Abdomen, Front perineal area, Buttocks, Right upper leg, Face, Left upper leg   Body parts bathed by helper: Right lower leg, Left lower leg     Bathing assist Assist Level: Minimal Assistance - Patient > 75%     Upper Body Dressing/Undressing Upper body dressing   What is the patient wearing?: Bra, Pull over shirt    Upper body assist Assist Level: Supervision/Verbal cueing    Lower Body Dressing/Undressing Lower body dressing      What is the patient wearing?: Underwear/pull up, Pants     Lower body assist Assist for lower body dressing: Contact Guard/Touching assist     Toileting Toileting    Toileting assist Assist for toileting: Contact Guard/Touching assist     Transfers Chair/bed transfer  Transfers assist     Chair/bed transfer assist level: Minimal Assistance - Patient > 75%     Locomotion Ambulation   Ambulation assist      Assist level: Minimal Assistance -  Patient > 75% Assistive device: Walker-rolling Max distance: 75 ft   Walk 10 feet activity   Assist     Assist level: Contact Guard/Touching assist Assistive device: Walker-rolling   Walk 50 feet activity   Assist    Assist level: Minimal Assistance - Patient > 75% Assistive device: Walker-rolling    Walk 150 feet activity   Assist Walk 150 feet activity did not occur: Safety/medical concerns         Walk 10 feet on uneven surface  activity   Assist Walk 10 feet on uneven surfaces activity did not occur: Safety/medical concerns         Wheelchair     Assist Is the patient using a wheelchair?: Yes Type of Wheelchair: Manual    Wheelchair assist level: Dependent - Patient 0% Max wheelchair distance: 300 ft    Wheelchair 50 feet with 2  turns activity    Assist        Assist Level: Dependent - Patient 0%   Wheelchair 150 feet activity     Assist      Assist Level: Dependent - Patient 0%   Blood pressure 110/66, pulse 71, temperature 98 F (36.7 C), resp. rate 17, height 4' 11 (1.499 m), weight 85.5 kg, SpO2 97%.  Medical Problem List and Plan: 1. 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  Antithrombotics: -DVT/anticoagulation:  Pharmaceutical: Lovenox  40mg  daily -antiplatelet therapy: Aspirin  81 mg daily and Plavix  75 mg day x 3 weeks then aspirin  alone 3. Pain Management: Neurontin  300 mg 3 times daily -baclofen  5 mg 3 times daily prn muscle spasms -Voltaren  4 g 4 times daily as needed--change to [employer] -oxycodone  5 mg every 6 hours as needed -1/3 will schedule tylenol  650mg  TID to start, add kpad also Xray Right shoulder  4. Mood/Behavior/Sleep: Trazodone  100 mg nightly.  Provide emotional support             -antipsychotic agents: N/A 5. Neuropsych/cognition: This patient is not quite capable of making decisions on her own behalf. 6. Skin/Wound Care: Routine skin checks 7. Fluids/Electrolytes/Nutrition: Routine in and outs  -encourage liquids, po intake  -labs from 12/30 without significant abnl    -recheck Monday  8.  Hyponatremia.  Follow-up chemistries 9.  Hyperlipidemia.  Lipitor 40mg  daily 10.  Obesity.  History of gastric bypass.  BMI 39.77.  Dietary follow-up 11.  GERD.  Protonix  40mg  daily 12.  History of iron  deficiency anemia.     -recent hgb 11.0 on 12/30--recheck Monday  -fe++ supp 13.  Permissive hypertension.  Patient on Benicar  HCT 40-25 mg tablet daily prior to admission.  Resume as needed  -bp soft despite being off all meds  -encourage fluids  -1/4-5/25 BPs improving, cont  monitoring Vitals:   05/14/23 1436 05/14/23 1947 05/15/23 0525 05/15/23 1251  BP: 113/66 128/61 105/61 125/76   05/15/23 1950 05/16/23 0516 05/16/23 1500 05/16/23 2020  BP: (!) 123/53 111/72 117/73 109/65   05/17/23 0448 05/17/23 1614 05/17/23 2028 05/18/23 0419  BP: 119/75 117/62 115/71 110/66     Monitor off meds 1/6  LOS: 4 days A FACE TO FACE EVALUATION WAS PERFORMED  Prentice BRAVO Yury Schaus 05/18/2023, 8:19 AM

## 2023-05-19 ENCOUNTER — Encounter (HOSPITAL_COMMUNITY): Payer: Self-pay | Admitting: Physical Medicine & Rehabilitation

## 2023-05-19 DIAGNOSIS — I6350 Cerebral infarction due to unspecified occlusion or stenosis of unspecified cerebral artery: Secondary | ICD-10-CM | POA: Diagnosis not present

## 2023-05-19 DIAGNOSIS — M25811 Other specified joint disorders, right shoulder: Secondary | ICD-10-CM | POA: Diagnosis not present

## 2023-05-19 DIAGNOSIS — I10 Essential (primary) hypertension: Secondary | ICD-10-CM | POA: Diagnosis not present

## 2023-05-19 NOTE — Progress Notes (Signed)
 PROGRESS NOTE   Subjective/Complaints:  Pt states she is on Oxy IR at home 20mg  BID , shoulder pain   ROS: as per HPI. Denies CP, SOB, abd pain, N/V/D/C, or any other complaints at this time.     Objective:   DG Shoulder Right Result Date: 05/18/2023 CLINICAL DATA:  Shoulder pain EXAM: RIGHT SHOULDER - 2+ VIEW COMPARISON:  None Available. FINDINGS: Mild AC joint degenerative change. No fracture or malalignment. The right lung is clear IMPRESSION: Mild AC joint degenerative change. Electronically Signed   By: Luke Bun M.D.   On: 05/18/2023 17:06   Recent Labs    05/18/23 0522  WBC 4.7  HGB 10.7*  HCT 33.5*  PLT 264   Recent Labs    05/18/23 0522  NA 139  K 4.4  CL 108  CO2 23  GLUCOSE 76  BUN 15  CREATININE 1.00  CALCIUM  8.3*    Intake/Output Summary (Last 24 hours) at 05/19/2023 0804 Last data filed at 05/19/2023 0727 Gross per 24 hour  Intake 358 ml  Output --  Net 358 ml        Physical Exam: Vital Signs Blood pressure (!) 117/57, pulse 62, temperature 97.8 F (36.6 C), resp. rate 17, height 4' 11 (1.499 m), weight 85.5 kg, SpO2 100%.  General: No acute distress Mood and affect are appropriate Heart: Regular rate and rhythm no rubs murmurs or extra sounds Lungs: Clear to auscultation, breathing unlabored, no rales or wheezes Abdomen: Positive bowel sounds, soft nontender to palpation, nondistended Extremities: No clubbing, cyanosis, or edema Skin: No evidence of breakdown, no evidence of rash  Neuro:  Alert and oriented x 3. Fair insight and awareness. Fair Memory. Normal language and speech. Cranial nerve exam unremarkable. MMT: 4/5 in bilateral Delt bi, tri, 2- HF 3- KE 4/5 B ADF and PF  Decreased FMC, mild ataxia LUE>RUE and LLE>RLE. Musculoskeletal: Both traps tender along anterior border with palpation, left side > right  + impingement sign in Right shoulder    Assessment/Plan: 1.  Functional deficits which require 3+ hours per day of interdisciplinary therapy in a comprehensive inpatient rehab setting. Physiatrist is providing close team supervision and 24 hour management of active medical problems listed below. Physiatrist and rehab team continue to assess barriers to discharge/monitor patient progress toward functional and medical goals  Care Tool:  Bathing    Body parts bathed by patient: Left arm, Right arm, Chest, Abdomen, Front perineal area, Buttocks, Right upper leg, Face, Left upper leg   Body parts bathed by helper: Right lower leg, Left lower leg     Bathing assist Assist Level: Minimal Assistance - Patient > 75%     Upper Body Dressing/Undressing Upper body dressing   What is the patient wearing?: Bra, Pull over shirt    Upper body assist Assist Level: Supervision/Verbal cueing    Lower Body Dressing/Undressing Lower body dressing      What is the patient wearing?: Underwear/pull up, Pants     Lower body assist Assist for lower body dressing: Contact Guard/Touching assist     Toileting Toileting    Toileting assist Assist for toileting: Supervision/Verbal cueing  Transfers Chair/bed transfer  Transfers assist     Chair/bed transfer assist level: Minimal Assistance - Patient > 75%     Locomotion Ambulation   Ambulation assist      Assist level: Minimal Assistance - Patient > 75% Assistive device: Walker-rolling Max distance: 75 ft   Walk 10 feet activity   Assist     Assist level: Contact Guard/Touching assist Assistive device: Walker-rolling   Walk 50 feet activity   Assist    Assist level: Minimal Assistance - Patient > 75% Assistive device: Walker-rolling    Walk 150 feet activity   Assist Walk 150 feet activity did not occur: Safety/medical concerns         Walk 10 feet on uneven surface  activity   Assist Walk 10 feet on uneven surfaces activity did not occur: Safety/medical  concerns         Wheelchair     Assist Is the patient using a wheelchair?: Yes Type of Wheelchair: Manual    Wheelchair assist level: Dependent - Patient 0% Max wheelchair distance: 300 ft    Wheelchair 50 feet with 2 turns activity    Assist        Assist Level: Dependent - Patient 0%   Wheelchair 150 feet activity     Assist      Assist Level: Dependent - Patient 0%   Blood pressure (!) 117/57, pulse 62, temperature 97.8 F (36.6 C), resp. rate 17, height 4' 11 (1.499 m), weight 85.5 kg, SpO2 100%.  Medical Problem List and Plan: 1. 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  Antithrombotics: -DVT/anticoagulation:  Pharmaceutical: Lovenox  40mg  daily -antiplatelet therapy: Aspirin  81 mg daily and Plavix  75 mg day x 3 weeks then aspirin  alone 3. Pain Management: Neurontin  300 mg 3 times daily -baclofen  5 mg 3 times daily prn muscle spasms -Voltaren  4 g 4 times daily as needed--change to [employer] -oxycodone  5 mg every 6 hours as needed -Change tylenol  from scheduled to prn  Xray Right shoulder  4. Mood/Behavior/Sleep: Trazodone  100 mg nightly.  Provide emotional support             -antipsychotic agents: N/A 5. Neuropsych/cognition: This patient is not quite capable of making decisions on her own behalf. 6. Skin/Wound Care: Routine skin checks 7. Fluids/Electrolytes/Nutrition: Routine in and outs  -encourage liquids, po intake  -labs from 12/30 without significant abnl    -recheck Monday  8.  Hyponatremia.  Follow-up chemistries 9.  Hyperlipidemia.  Lipitor 40mg  daily 10.  Obesity.  History of gastric bypass.  BMI 39.77.  Dietary follow-up 11.  GERD.  Protonix  40mg  daily 12.  History of iron  deficiency anemia.     -recent hgb 11.0 on  12/30--recheck Monday  -fe++ supp 13.  Permissive hypertension.  Patient on Benicar  HCT 40-25 mg tablet daily prior to admission.  Resume as needed  -bp soft despite being off all meds  -encourage fluids  -1/4-5/25 BPs improving, cont monitoring Vitals:   05/15/23 1251 05/15/23 1950 05/16/23 0516 05/16/23 1500  BP: 125/76 (!) 123/53 111/72 117/73   05/16/23 2020 05/17/23 0448 05/17/23 1614 05/17/23 2028  BP: 109/65 119/75 117/62 115/71   05/18/23 0419 05/18/23 1533  05/18/23 1949 05/19/23 0537  BP: 110/66 107/63 114/68 (!) 117/57     Monitor off meds 1/7  LOS: 5 days A FACE TO FACE EVALUATION WAS PERFORMED  Prentice BRAVO Jeramyah Goodpasture 05/19/2023, 8:04 AM

## 2023-05-19 NOTE — Progress Notes (Signed)
 Speech Language Pathology Daily Session Note  Patient Details  Name: AICHA CLINGENPEEL MRN: [id number] Date of Birth: [birth year]  Today's Date: 05/19/2023 SLP Individual Time: 0904-1003 SLP Individual Time Calculation (min): 59 min  Short Term Goals: Week 1: SLP Short Term Goal 1 (Week 1): Patient will utilize compensatory strategies to increase word finding during conversation given min multimodal A SLP Short Term Goal 2 (Week 1): Patient will recall and utilize memory compensatory strategies given mod multimodal A SLP Short Term Goal 3 (Week 1): Patient will recall and utilize education regarding speech intelligibility strategies during instances of dysarthria given minA  Skilled Therapeutic Interventions: Skilled therapy session focused on communication goals. SLP reviewed word finding strategies taught in prior sessions, specifically describe. SLP targeted goal by completion of word description task. Patient utilized semantic feature analysis to describe single vocabulary words given minA. At the end of the session, patient utilized strategies during picture description task. Upon instances of anomia, SLP provided minA for patient to utilize describe strategy to effectively communicate thought. At the end of the session, patient left in chair with alarm set and call bell in reach. Continue POC.   Pain Back pain, SLP re-positioned as able  Therapy/Group: Individual Therapy  Jamie Belger M.A., CF-SLP 05/19/2023, 7:49 AM

## 2023-05-19 NOTE — Progress Notes (Signed)
 Physical Therapy Session Note  Patient Details  Name: Christine Reyes MRN: [id number] Date of Birth: Sep 08, 1955  Today's Date: 05/19/2023 PT Individual Time: 1415-1454 PT Individual Time Calculation (min): 39 min   Short Term Goals: Week 1:  PT Short Term Goal 1 (Week 1): Pt will perform all bed mobility with Mod I from flat mattress. PT Short Term Goal 2 (Week 1): Pt will perform all functional transfers with close supervision. PT Short Term Goal 3 (Week 1): Pt will ambulate at least 130 ft using RW with close supervision. PT Short Term Goal 4 (Week 1): Pt will perform at least 8 steps with close supervision.  Skilled Therapeutic Interventions/Progress Updates:     Pt in bed on arrival - c/o feeling cold. Donned large overhead sweater to improve tolerance during therapy session.   Supine<>Sitting EOB with supervision. Sit<>stand to RW with CGA. Ambulates with CGA and RW from her room to main rehab gym, ~123ft.   In // bars, worked on AUTOMATIC DATA for standing balance: -feet apart on airex pad with eyes open 45 seconds unsupported -feet apart on airex pad with eyes closed 10-25 seconds unsupported -tandem heel to toe walking on foam balance beam with finger tip support 4x72ft -lateral stepping on foam balance beam with finger tip support 4x23ft  Pt ambulated with supervision and RW back to her room ~144ft - patient c/o of mild B shoulder discomfort and requesting muscle rub. RN made aware. Patient left sitting EOB for RN to apply muscle rub cream - all needs met at end of session.    Therapy Documentation Precautions:  Precautions Precautions: Fall Precaution Comments: L hemipareisis, intermittent LLE pain with open-chain movement Restrictions Weight Bearing Restrictions Per Provider Order: No General:      Therapy/Group: Individual Therapy  Christine Reyes 05/19/2023, 2:36 PM

## 2023-05-19 NOTE — Progress Notes (Signed)
 Occupational Therapy Session Note  Patient Details  Name: Christine Reyes MRN: [id number] Date of Birth: [birth year]  Today's Date: 05/19/2023 OT Individual Time: 8969-8884 OT Individual Time Calculation (min): 45 min    Short Term Goals: Week 1:  OT Short Term Goal 1 (Week 1): pt will be able to don underwear and pants with supervision only. OT Short Term Goal 2 (Week 1): Pt will be able to don socks and shoes with min A. OT Short Term Goal 3 (Week 1): Pt will ambulate to the toilet with RW with supervision. OT Short Term Goal 4 (Week 1): Pt will be able to engage in a room HEP using light theraband.  Skilled Therapeutic Interventions/Progress Updates:    Pt received in recliner dressed and ready for the day. Pt declined a shower as she stated she bathed yesterday.  Pt ambulated with RW with CGA to tub room to practice tub bench transfers. Pt did well getting in and off bench with light CGA. She used her hands to help lift her legs over the tub wall.  Pt then ambulated to mat in gym. She discussed her R shoulder pain with increased pain in front of her shoulder.  Pt worked on R shoulder AROM with table top slides and rolling ball back and forth for gravity supported positioning for more pain free range of motion.   For scapular retraction to strengthen supporting muscles, pt worked on rowing pulls with yellow band with wrist in neutral, supinated and pronated.  Pt stated she tolerated these exercises with minimal discomfort.  ambulation to gym Pt then worked on LLE coordination with ball exercises of rolling foot back and forth and rolling ball in circles.  She demonstrated good control of leg and foot.   Pt ambulated back to room.  Pt resting in recliner with all needs met. Alarm set and call light in reach.    Therapy Documentation Precautions:  Precautions Precautions: Fall Precaution Comments: L hemipareisis, intermittent LLE pain with open-chain movement Restrictions Weight Bearing  Restrictions Per Provider Order: No Pain: Pain Assessment Pain Score: 4 Mild R shoulder pain with mobility ADL: ADL Eating: Set up Grooming: Setup Upper Body Bathing: Supervision/safety Where Assessed-Upper Body Bathing: Shower Lower Body Bathing: Contact guard Where Assessed-Lower Body Bathing: Shower Upper Body Dressing: Supervision/safety Lower Body Dressing: Contact guard Toileting: Contact guard Where Assessed-Toileting: Teacher, Adult Education: Furniture Conservator/restorer Method: Proofreader: Acupuncturist: Administrator, Arts Method: Designer, Industrial/product: Emergency planning/management officer, Grab bars    Therapy/Group: Individual Therapy  Cayle Cordoba 05/19/2023, 12:58 PM

## 2023-05-20 DIAGNOSIS — M25811 Other specified joint disorders, right shoulder: Secondary | ICD-10-CM | POA: Diagnosis not present

## 2023-05-20 DIAGNOSIS — I6350 Cerebral infarction due to unspecified occlusion or stenosis of unspecified cerebral artery: Secondary | ICD-10-CM | POA: Diagnosis not present

## 2023-05-20 DIAGNOSIS — I639 Cerebral infarction, unspecified: Secondary | ICD-10-CM | POA: Diagnosis not present

## 2023-05-20 DIAGNOSIS — I10 Essential (primary) hypertension: Secondary | ICD-10-CM | POA: Diagnosis not present

## 2023-05-20 NOTE — Progress Notes (Signed)
 PROGRESS NOTE   Subjective/Complaints:  Shoulder pain controlled with bengay   ROS: as per HPI. Denies CP, SOB, abd pain, N/V/D/C, or any other complaints at this time.     Objective:   DG Shoulder Right Result Date: 05/18/2023 CLINICAL DATA:  Shoulder pain EXAM: RIGHT SHOULDER - 2+ VIEW COMPARISON:  None Available. FINDINGS: Mild AC joint degenerative change. No fracture or malalignment. The right lung is clear IMPRESSION: Mild AC joint degenerative change. Electronically Signed   By: Luke Bun M.D.   On: 05/18/2023 17:06   Recent Labs    05/18/23 0522  WBC 4.7  HGB 10.7*  HCT 33.5*  PLT 264   Recent Labs    05/18/23 0522  NA 139  K 4.4  CL 108  CO2 23  GLUCOSE 76  BUN 15  CREATININE 1.00  CALCIUM  8.3*    Intake/Output Summary (Last 24 hours) at 05/20/2023 0703 Last data filed at 05/19/2023 1736 Gross per 24 hour  Intake 994 ml  Output --  Net 994 ml        Physical Exam: Vital Signs Blood pressure (!) 123/49, pulse 70, temperature 98.1 F (36.7 C), resp. rate 17, height 4' 11 (1.499 m), weight 85.5 kg, SpO2 100%.  General: No acute distress Mood and affect are appropriate Heart: Regular rate and rhythm no rubs murmurs or extra sounds Lungs: Clear to auscultation, breathing unlabored, no rales or wheezes Abdomen: Positive bowel sounds, soft nontender to palpation, nondistended Extremities: No clubbing, cyanosis, or edema Skin: No evidence of breakdown, no evidence of rash  Neuro:  Alert and oriented x 3. Fair insight and awareness. Fair Memory. Normal language and speech. Cranial nerve exam unremarkable. MMT: 4/5 in bilateral Delt bi, tri, 3- HF 3- KE 4/5 B ADF and PF  Decreased FMC, mild ataxia LUE>RUE and LLE>RLE. Musculoskeletal: Both traps tender along anterior border with palpation, left side > right  + impingement sign in Right shoulder    Assessment/Plan: 1. Functional deficits which  require 3+ hours per day of interdisciplinary therapy in a comprehensive inpatient rehab setting. Physiatrist is providing close team supervision and 24 hour management of active medical problems listed below. Physiatrist and rehab team continue to assess barriers to discharge/monitor patient progress toward functional and medical goals  Care Tool:  Bathing    Body parts bathed by patient: Left arm, Right arm, Chest, Abdomen, Front perineal area, Buttocks, Right upper leg, Face, Left upper leg   Body parts bathed by helper: Right lower leg, Left lower leg     Bathing assist Assist Level: Minimal Assistance - Patient > 75%     Upper Body Dressing/Undressing Upper body dressing   What is the patient wearing?: Bra, Pull over shirt    Upper body assist Assist Level: Supervision/Verbal cueing    Lower Body Dressing/Undressing Lower body dressing      What is the patient wearing?: Underwear/pull up, Pants     Lower body assist Assist for lower body dressing: Contact Guard/Touching assist     Toileting Toileting    Toileting assist Assist for toileting: Contact Guard/Touching assist     Transfers Chair/bed transfer  Transfers assist  Chair/bed transfer assist level: Minimal Assistance - Patient > 75%     Locomotion Ambulation   Ambulation assist      Assist level: Minimal Assistance - Patient > 75% Assistive device: Walker-rolling Max distance: 75 ft   Walk 10 feet activity   Assist     Assist level: Contact Guard/Touching assist Assistive device: Walker-rolling   Walk 50 feet activity   Assist    Assist level: Minimal Assistance - Patient > 75% Assistive device: Walker-rolling    Walk 150 feet activity   Assist Walk 150 feet activity did not occur: Safety/medical concerns         Walk 10 feet on uneven surface  activity   Assist Walk 10 feet on uneven surfaces activity did not occur: Safety/medical concerns          Wheelchair     Assist Is the patient using a wheelchair?: Yes Type of Wheelchair: Manual    Wheelchair assist level: Dependent - Patient 0% Max wheelchair distance: 300 ft    Wheelchair 50 feet with 2 turns activity    Assist        Assist Level: Dependent - Patient 0%   Wheelchair 150 feet activity     Assist      Assist Level: Dependent - Patient 0%   Blood pressure (!) 123/49, pulse 70, temperature 98.1 F (36.7 C), resp. rate 17, height 4' 11 (1.499 m), weight 85.5 kg, SpO2 100%.  Medical Problem List and Plan: 1. 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 Formulized individual treatment plan based on medical history, underlying problem and comorbidities.   -Continue CIR  2.  Antithrombotics: -DVT/anticoagulation:  Pharmaceutical: Lovenox  40mg  daily -antiplatelet therapy: Aspirin  81 mg daily and Plavix  75 mg day x 3 weeks then aspirin  alone 3. Pain Management: Neurontin  300 mg 3 times daily -baclofen  5 mg 3 times daily prn muscle spasms -Voltaren  4 g 4 times daily as needed--change to [employer] -oxycodone  5 mg every 6 hours as needed -Change tylenol  from scheduled to prn  Xray Right shoulder  4. Mood/Behavior/Sleep: Trazodone  100 mg nightly.  Provide emotional support             -antipsychotic agents: N/A 5. Neuropsych/cognition: This patient is not quite capable of making decisions on her own behalf. 6. Skin/Wound Care: Routine skin checks 7. Fluids/Electrolytes/Nutrition: Routine in and outs  -encourage liquids, po intake  -labs from 12/30 without significant abnl    -recheck Monday  8.  Hyponatremia.  Follow-up chemistries 9.   Hyperlipidemia.  Lipitor 40mg  daily 10.  Obesity.  History of gastric bypass.  BMI 39.77.  Dietary follow-up 11.  GERD.  Protonix  40mg  daily 12.  History of iron  deficiency anemia.     -recent hgb 11.0 on 12/30--recheck Monday  -fe++ supp 13.  Permissive hypertension.  Patient on Benicar  HCT 40-25 mg tablet daily prior to admission.  Resume as needed  -bp soft despite being off all meds  -encourage fluids  -1/4-5/25 BPs improving, cont monitoring Vitals:   05/16/23 0516 05/16/23 1500 05/16/23 2020 05/17/23 0448  BP: 111/72 117/73 109/65 119/75   05/17/23 1614 05/17/23  2028 05/18/23 0419 05/18/23 1533  BP: 117/62 115/71 110/66 107/63   05/18/23 1949 05/19/23 0537 05/19/23 1957 05/20/23 0527  BP: 114/68 (!) 117/57 117/70 (!) 123/49     Monitor off meds 1/8  LOS: 6 days A FACE TO FACE EVALUATION WAS PERFORMED  Prentice BRAVO Christine Reyes 05/20/2023, 7:03 AM

## 2023-05-20 NOTE — Progress Notes (Signed)
 Speech Language Pathology Daily Session Note  Patient Details  Name: Christine Reyes MRN: [id number] Date of Birth: 1955/08/16  Today's Date: 05/20/2023 SLP Individual Time: 1101-1200 SLP Individual Time Calculation (min): 59 min  Short Term Goals: Week 1: SLP Short Term Goal 1 (Week 1): Patient will utilize compensatory strategies to increase word finding during conversation given min multimodal A SLP Short Term Goal 2 (Week 1): Patient will recall and utilize memory compensatory strategies given mod multimodal A SLP Short Term Goal 3 (Week 1): Patient will recall and utilize education regarding speech intelligibility strategies during instances of dysarthria given minA  Skilled Therapeutic Interventions: Skilled therapy session focused on communication and cognitive goals. SLP faciliated session by prompting patient to participate in structured job interview task. SLP encouraged patient to utilize word finding strategies as needed during conversation. Patient with intermittent anomia, though able to communicate effectively given mod i. SLP targeted cognitive goals through review of WRAP (write, repeat, associate, picture) memory strategies. Patient recalled 2/4 strategies given modA. Patient left in chair with alarm set and call bell in reach. Continue POC.   Pain Headache and knee pain, Nursing aware and provided medications  Therapy/Group: Individual Therapy  Kippy Melena M.A., CF-SLP 05/20/2023, 7:41 AM

## 2023-05-20 NOTE — Progress Notes (Signed)
 Patient ID: Christine Reyes, female   DOB: 27-Jun-1955, 68 y.o.   MRN: [id number]  SW went by to update pt from details from team conference, but in PT session.   1102-SW spoke with pt dtr [name] to provide updates from team conference, d/c date 1/14, and d/c recs- outpatient and DME- TTB and RW. She will discuss with her mother if she prefers outpatient vs HH due to transportation (pt has Medicaid transportation). SW will order TTB. Fam edu on Friday 9am-12pm with her dtr.   SW ordered TTB with [employer] via parachute.  *Update from [employer]- this patient received a TTB through insurance on 01/18/2021- Insurance will not cover another until 01/19/2026.  SW met with pt in room with PT to provide above updates. She is concerned about discharging home too soon, and afraid of being home alone even though her mother is there, she is not physically able to help. SW discussed pt having family edu, and expressing any concerns on Friday if any. Pt in agreement.   Graeme [name], MSW, LCSW Office: [phone] Cell: [phone] Fax: [phone]

## 2023-05-20 NOTE — Progress Notes (Signed)
 Patient ID: Christine Reyes, female   DOB: [birth year], 68 y.o.   MRN: [id number] ZIO patch placed per order.  Patient given verbal and demonstration instructions on ZIO patch use, box within 10 feet at all times during monitoring and push the green button on the device if symptoms are noted and where to document events in the guide book with the box, wear/care and what to do after the 14 day monitoring has been completed. Written instructions also given to the patient. Reviewed information with the daughter briefly yesterday afternoon, booklet at bedside for patient and family to reference should they have questions about the device.

## 2023-05-20 NOTE — Patient Care Conference (Signed)
 Inpatient RehabilitationTeam Conference and Plan of Care Update Date: 05/20/2023   Time: 10:08 AM    Patient Name: Christine Reyes      Medical Record Number: [id number]  Date of Birth: 1955-07-06 Sex: Female         Room/Bed: 4M03C/4M03C-01 Payor Info: Payor: [employer]ICARE / Plan: DREMA DUAL COMPLETE / Product Type: *No Product type* /    Admit Date/Time:  05/14/2023  1:46 PM  Primary Diagnosis:  Posterior circulation stroke [facility])  Hospital Problems: Principal Problem:   Posterior circulation stroke [facility])    Expected Discharge Date: Expected Discharge Date: 05/26/23  Team Members Present: Physician leading conference: Dr. Prentice Compton Social Worker Present: Graeme [name], LCSWA Nurse Present: Barnie Ronde, RN PT Present: Recardo Milliner, PT OT Present: Delon Sharps, OT SLP Present: Blaise Alderman, SLP PPS Coordinator present : Eleanor Colon, SLP     Current Status/Progress Goal Weekly Team Focus  Bowel/Bladder   Continent of b/b [facility] 1/7   maintain continence   offer toileting qshift and PRN    Swallow/Nutrition/ Hydration               ADL's   CGA overall due to balance and LLE incoordination   mod Independent, except for tub transfers with S   ADL training, balance, UE/LE strengthening - pt education    Mobility   bed mobility = Mod I for extra time, transfers = supervision, ambulation = supervision with RW for >150 ft   Mod I/ supervision  Barriers: improving self efficacy /// Work on: L hemi NMR, activity tolerance, general strengthening, standing balance, family educ    Communication   minA   supervision-minA   word finding, use of strategies during conversation    Safety/Cognition/ Behavioral Observations  minA - ~70% accuracy during delayed recall   minA   recall and utilization of memory strategies for STM    Pain   patient c/o neck and back pain and right shoulder pain. Oxy and muscle rub PRN controls pain    keep pain controled   assess pain qshift and PRN offering nonpharm options first    Skin   skin is intact   maintain skin integrity  assess skin qshift and PRN      Discharge Planning:  Pt will d/c to home alone with intermittent check-ins throughout the day, ort only in the evening. Pt will need to be Mod I at discharge. SW will submit PCS referral if appropriate. SW will confirm there are no barriers to discharge.   Team Discussion: Posterior circulation stroke. Blood pressure well controlled (off blood pressure medications currently). Chronic pain, with scheduled and PRN medications. Tolerating regular diet. Trazodone  for sleep. Will need to be MOD I at discharge. Phone for Zio patch needs to go with patient when going to therapy. ADL's currently CGA. Working on bilateral upper/lower strengthening, family/patient education. Needs extra time to complete tasks. Supervision for transfers. Gait greater than 172ft. Word finding is biggest deficit. Working on [employer]. May upgrade some speech goals.  Patient on target to meet rehab goals: yes, continues to progress towards goals with discharge date of 05/26/23  *See Care Plan and progress notes for long and short-term goals.   Revisions to Treatment Plan:  Zio patch applied today and needs to be removed on 06/03/23  Teaching Needs: Medications, safety, self care, diet modification, gait/transfer training, skin care, etc.   Current Barriers to Discharge: Lack of/limited family support and Weight  Possible Resolutions to  Barriers: Family education Diet/lifestyle modifications for weight and overall health Order recommended DME      Medical Summary Current Status: zio patch, chronic pain syndrome on oxycodone  at home,  Barriers to Discharge: Medical stability   Possible Resolutions to [employer] Focus: uncontrolled pain,  adjust pain interventions   Continued Need for Acute Rehabilitation Level of Care: The patient  requires daily medical management by a physician with specialized training in physical medicine and rehabilitation for the following reasons: Direction of a multidisciplinary physical rehabilitation program to maximize functional independence : Yes Medical management of patient stability for increased activity during participation in an intensive rehabilitation regime.: Yes Analysis of laboratory values and/or radiology reports with any subsequent need for medication adjustment and/or medical intervention. : Yes   I attest that I was present, lead the team conference, and concur with the assessment and plan of the team.   Darice LITTIE Boring 05/20/2023, 12:35 PM

## 2023-05-20 NOTE — Progress Notes (Signed)
 Physical Therapy Session Note  Patient Details  Name: Christine Reyes MRN: [id number] Date of Birth: Jan 14, 1956  Today's Date: 05/20/2023 PT Individual Time: 1015-1057 PT Individual Time Calculation (min): 42 min   Short Term Goals: Week 1:  PT Short Term Goal 1 (Week 1): Pt will perform all bed mobility with Mod I from flat mattress. PT Short Term Goal 2 (Week 1): Pt will perform all functional transfers with close supervision. PT Short Term Goal 3 (Week 1): Pt will ambulate at least 130 ft using RW with close supervision. PT Short Term Goal 4 (Week 1): Pt will perform at least 8 steps with close supervision.  Skilled Therapeutic Interventions/Progress Updates:      Pt in bed - agreeable to therapy - reports arthritic knee pain in both knees. Offered wrapping with ace-wrap during therapy to improve comfort - pt ammenable. Pt wanting to locate her grey jacket due to being cold natured - unable to locate but she was ambulating in room distances with RW at supervision level - reaching for clothes on the floor without LOB and opening drawers/suitcases without difficulty in standing position. Ambulated to main rehab gym, ~129ft with supervision and RW. Pt instructed in stair training - able to navigate up/down x8 3 steps + x4 6 steps using 2 hand rails at supervision level, completes while forward facing with a step-to pattern. Pt then instructed in dynamic balance while standing on blue air-ex in unsupported position while tossing basketball to rebounder - light minA required for safety and balance. Decreased stepping strategies for balance recovery. Ambulated back to her room with similar assist and cues - left sitting upright in recliner with all needs met.   Therapy Documentation Precautions:  Precautions Precautions: Fall Precaution Comments: L hemipareisis, intermittent LLE pain with open-chain movement Restrictions Weight Bearing Restrictions Per Provider Order: No General:      Therapy/Group: Individual Therapy  Sherlean SHAUNNA Perks 05/20/2023, 7:52 AM

## 2023-05-20 NOTE — Progress Notes (Signed)
 Physical Therapy Session Note  Patient Details  Name: Christine Reyes MRN: [id number] Date of Birth: [birth year]  Today's Date: 05/20/2023 PT Individual Time: 1301-1415 PT Individual Time Calculation (min): 74 min   Short Term Goals: Week 1:  PT Short Term Goal 1 (Week 1): Pt will perform all bed mobility with Mod I from flat mattress. PT Short Term Goal 2 (Week 1): Pt will perform all functional transfers with close supervision. PT Short Term Goal 3 (Week 1): Pt will ambulate at least 130 ft using RW with close supervision. PT Short Term Goal 4 (Week 1): Pt will perform at least 8 steps with close supervision.  Skilled Therapeutic Interventions/Progress Updates:  Patient seated on EOB on entrance to room. Patient alert and agreeable to PT session.   Patient with no pain complaint at start of session. But does relate that she is uncomfortable knowing that she is not going home to any supervision. Does not feel she is ready. CSW enters with update from team conference meeting. Discharge date is Tuesday 1/14 and pt again relates discomfort. Discussion with pt that she is performing better than she realizes and has time prior to Tuesday to continue to improve. Dtr will be here Friday morning for family education to learn how to assist pt - which will be very minimal as pt has progressed well.   Therapeutic Activity: Transfers: Pt performed sit<>stand and stand pivot transfers throughout session with supervision/ Mod I. Provided vc for positioning. Pt later provided with rollator and demos understanding to lock brakes prior to stance but then does not lock brakes prior to sitting. Requires vc to correct.   Gait Training:  Pt ambulates ~200 ft using youth RW to reach main therapy gym with supervision. Pt then guided in ambulation over 30 ft using no AD in order to progress balance and ambulation. Requires CGA with forward positioning of therapist over first 15 feet and then change to side  positioning for remainder. Demos significantly shorter steps with increased hesitancy initially that improves slightly throughout. Demonstrated to pt that she would not have been able to complete last week and will continue to improve. Pt relates having rollator at home and so one retrieved for practice. Pt able to return ambulate to room from therapy gym  with improving quality of gait throughout d/t initial instability with increased mobility of rollator. Demos good balance throughout and completes with supervision.   Patient supine in bed at end of session with brakes locked, bed alarm set, and all needs within reach.   Therapy Documentation Precautions:  Precautions Precautions: Fall Precaution Comments: L hemipareisis, intermittent LLE pain with open-chain movement Restrictions Weight Bearing Restrictions Per Provider Order: No  Pain: No pain related by pt this session. Actually relates that earlier knee pain has improved since application of muscle rub.   Therapy/Group: Individual Therapy  Christine Reyes PT, DPT, CSRS 05/20/2023, 2:29 PM

## 2023-05-20 NOTE — Progress Notes (Signed)
 Occupational Therapy Session Note  Patient Details  Name: IYSHA MISHKIN MRN: [id number] Date of Birth: 1955/07/25  Today's Date: 05/20/2023 OT Individual Time: 9262-9146 OT Individual Time Calculation (min): 76 min    Short Term Goals: Week 1:  OT Short Term Goal 1 (Week 1): pt will be able to don underwear and pants with supervision only. OT Short Term Goal 2 (Week 1): Pt will be able to don socks and shoes with min A. OT Short Term Goal 3 (Week 1): Pt will ambulate to the toilet with RW with supervision. OT Short Term Goal 4 (Week 1): Pt will be able to engage in a room HEP using light theraband.  Skilled Therapeutic Interventions/Progress Updates:  Pt greeted supine in bed, pt agreeable to OT intervention.      Transfers/bed mobility/functional mobility: pt completed supine>sit with supervision with bed elevated to 49*. Pt completed sit>stand from EOB with RW and supervision. Pt completed ambulatory transfer to bathroom with Rw and close supervision. Pt additionally completed functional ambulation greater than a household distance with RW and supervision.    ADLs:  Grooming: pt able to stand at sink for oral care with supervision  UB dressing:pt donned button up shirt with set- up, assist was needed to fasten 1 small button on shirt LB dressing: pt did require assist to doff pants from toilet d/t R shoulder pain, pt donned pants/underwear from EOB with supervision Footwear: MAX A to don socks and shoes, discussed use of reacher and sock aid as needed.   Bathing: pt completed bathing tasks in sitting/standing with supervision.  Transfers: ambulatory ADL transfers with RW and CGA- supervision.  Toileting: pt completed 3/3 toileting tasks with CGA, continent urine void.    Exercises: pt completed below therex with unweighted dowel rod to facilitate improved active assist ROM in RUE, pt feels pain is related to suspected arthritis:  X20 shoulder flexion to 90* only  X20 internal  external shoulder rotation  X20 forward rows                Ended session with pt supine in bed with all needs within reach and bed alarm activated.                   Therapy Documentation Precautions:  Precautions Precautions: Fall Precaution Comments: L hemipareisis, intermittent LLE pain with open-chain movement Restrictions Weight Bearing Restrictions Per Provider Order: No  Pain: Unrated pain reported in R shoulder, rest breaks, repositioning and compensatory methods provided as needed.    Therapy/Group: Individual Therapy  [name] 05/20/2023, 12:12 PM

## 2023-05-21 DIAGNOSIS — I10 Essential (primary) hypertension: Secondary | ICD-10-CM | POA: Diagnosis not present

## 2023-05-21 DIAGNOSIS — M25811 Other specified joint disorders, right shoulder: Secondary | ICD-10-CM | POA: Diagnosis not present

## 2023-05-21 DIAGNOSIS — I6350 Cerebral infarction due to unspecified occlusion or stenosis of unspecified cerebral artery: Secondary | ICD-10-CM | POA: Diagnosis not present

## 2023-05-21 MED ORDER — ACETAMINOPHEN 325 MG PO TABS: 650.0000 mg | ORAL_TABLET | Freq: Four times a day (QID) | ORAL | Status: DC | PRN

## 2023-05-21 NOTE — Plan of Care (Signed)
 Goals upgraded due to patient progress Problem: RH Expression Communication Goal: LTG Patient will increase word finding of common (SLP) Description: LTG:  Patient will increase word finding of common objects/daily info/abstract thoughts with cues using compensatory strategies (SLP). Flowsheets (Taken 05/15/2023 1234) LTG: Patient will increase word finding of common (SLP): Supervision   Problem: RH Memory Goal: LTG Patient will use memory compensatory aids to (SLP) Description: LTG:  Patient will use memory compensatory aids to recall biographical/new, daily complex information with cues (SLP) Flowsheets (Taken 05/21/2023 0844) LTG: Patient will use memory compensatory aids to (SLP): Supervision

## 2023-05-21 NOTE — Progress Notes (Signed)
 PROGRESS NOTE   Subjective/Complaints: Discussed d/c date , also that goals are mod I /Sup for basic ADLs and mobility No driving until cleared at subsequent OP visit s  B knee pain , partial relief with bengay cream, states voltaren  gel not helpful   ROS: as per HPI. Denies CP, SOB, abd pain, N/V/D/C, or any other complaints at this time.     Objective:   No results found.  No results for input(s): WBC, HGB, HCT, PLT in the last 72 hours.  No results for input(s): NA, K, CL, CO2, GLUCOSE, BUN, CREATININE, CALCIUM  in the last 72 hours.   Intake/Output Summary (Last 24 hours) at 05/21/2023 0710 Last data filed at 05/20/2023 1900 Gross per 24 hour  Intake 960 ml  Output --  Net 960 ml        Physical Exam: Vital Signs Blood pressure 123/66, pulse 62, temperature 98 F (36.7 C), temperature source Oral, resp. rate 17, height 4' 11 (1.499 m), weight 85.5 kg, SpO2 100%.  General: No acute distress Mood and affect are appropriate Heart: Regular rate and rhythm no rubs murmurs or extra sounds Lungs: Clear to auscultation, breathing unlabored, no rales or wheezes Abdomen: Positive bowel sounds, soft nontender to palpation, nondistended Extremities: No clubbing, cyanosis, or edema Skin: No evidence of breakdown, no evidence of rash  Neuro:  Alert and oriented x 3. Fair insight and awareness. Fair Memory. Normal language and speech. Cranial nerve exam unremarkable. MMT: 4/5 in bilateral Delt bi, tri, 3- HF 3- KE 4/5 B ADF and PF  Decreased FMC, mild ataxia LUE>RUE and LLE>RLE. Musculoskeletal: Both traps tender along anterior border with palpation, left side > right  + impingement sign in Right shoulder    Assessment/Plan: 1. Functional deficits which require 3+ hours per day of interdisciplinary therapy in a comprehensive inpatient rehab setting. Physiatrist is providing close team supervision and  24 hour management of active medical problems listed below. Physiatrist and rehab team continue to assess barriers to discharge/monitor patient progress toward functional and medical goals  Care Tool:  Bathing    Body parts bathed by patient: Left arm, Right arm, Chest, Abdomen, Front perineal area, Buttocks, Right upper leg, Face, Left upper leg, Right lower leg, Left lower leg   Body parts bathed by helper: Right lower leg, Left lower leg     Bathing assist Assist Level: Supervision/Verbal cueing     Upper Body Dressing/Undressing Upper body dressing   What is the patient wearing?: Button up shirt    Upper body assist Assist Level: Set up assist    Lower Body Dressing/Undressing Lower body dressing      What is the patient wearing?: Underwear/pull up, Pants     Lower body assist Assist for lower body dressing: Supervision/Verbal cueing     Toileting Toileting    Toileting assist Assist for toileting: Contact Guard/Touching assist     Transfers Chair/bed transfer  Transfers assist     Chair/bed transfer assist level: Contact Guard/Touching assist     Locomotion Ambulation   Ambulation assist      Assist level: Minimal Assistance - Patient > 75% Assistive device: Walker-rolling Max distance: 75 ft  Walk 10 feet activity   Assist     Assist level: Contact Guard/Touching assist Assistive device: Walker-rolling   Walk 50 feet activity   Assist    Assist level: Minimal Assistance - Patient > 75% Assistive device: Walker-rolling    Walk 150 feet activity   Assist Walk 150 feet activity did not occur: Safety/medical concerns         Walk 10 feet on uneven surface  activity   Assist Walk 10 feet on uneven surfaces activity did not occur: Safety/medical concerns         Wheelchair     Assist Is the patient using a wheelchair?: Yes Type of Wheelchair: Manual    Wheelchair assist level: Dependent - Patient 0% Max  wheelchair distance: 300 ft    Wheelchair 50 feet with 2 turns activity    Assist        Assist Level: Dependent - Patient 0%   Wheelchair 150 feet activity     Assist      Assist Level: Dependent - Patient 0%   Blood pressure 123/66, pulse 62, temperature 98 F (36.7 C), temperature source Oral, resp. rate 17, height 4' 11 (1.499 m), weight 85.5 kg, SpO2 100%.  Medical Problem List and Plan: 1. 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  Antithrombotics: -DVT/anticoagulation:  Pharmaceutical: Lovenox  40mg  daily -antiplatelet therapy: Aspirin  81 mg daily and Plavix  75 mg day x 3 weeks (last day 1/16)  then aspirin  alone 3. Pain Management: Neurontin  300 mg 3 times daily -baclofen  5 mg 3 times daily prn muscle spasms -Voltaren  4 g 4 times daily as needed--change to [employer] -oxycodone  5 mg every 6 hours as needed -Change tylenol  from scheduled to prn  Xray Right shoulder -  R AC jt OA 4. Mood/Behavior/Sleep: Trazodone  100 mg nightly.  Provide emotional support             -antipsychotic agents: N/A 5. Neuropsych/cognition: This patient is not quite capable of making decisions on her own behalf. 6. Skin/Wound Care: Routine skin checks 7. Fluids/Electrolytes/Nutrition: Routine in and outs  -encourage liquids, po intake  -labs from 12/30 without significant abnl    -recheck Monday  8.  Hyponatremia.  Follow-up chemistries 9.  Hyperlipidemia.  Lipitor 40mg  daily 10.  Obesity.  History of gastric bypass.  BMI 39.77.  Dietary follow-up 11.  GERD.  Protonix  40mg  daily 12.  History of iron  deficiency anemia.     -recent hgb 11.0 on 12/30--recheck Monday  -fe++ supp 13.  Permissive hypertension.  Patient on Benicar  HCT 40-25 mg tablet daily prior to admission.  Resume as needed  -bp soft  despite being off all meds  -encourage fluids  -1/4-5/25 BPs improving, cont monitoring Vitals:   05/16/23 2020 05/17/23 0448 05/17/23 1614 05/17/23 2028  BP: 109/65 119/75 117/62 115/71   05/18/23 0419 05/18/23 1533 05/18/23 1949 05/19/23 0537  BP: 110/66 107/63 114/68 (!) 117/57   05/19/23 1957 05/20/23 0527 05/20/23 1440 05/21/23 0522  BP: 117/70 (!) 123/49 124/67 123/66     Monitor off meds 1/9  LOS: 7 days A FACE TO FACE EVALUATION WAS PERFORMED  Prentice BRAVO Clay Solum 05/21/2023, 7:10 AM

## 2023-05-21 NOTE — Progress Notes (Signed)
 Speech Language Pathology Weekly Progress and Session Note  Patient Details  Name: Christine Reyes MRN: [id number] Date of Birth: [birth year]  Beginning of progress report period: May 15, 2023 End of progress report period: May 21, 2023  Today's Date: 05/21/2023 SLP Individual Time: 9199-9153 SLP Individual Time Calculation (min): 46 min  Short Term Goals: Week 1: SLP Short Term Goal 1 (Week 1): Patient will utilize compensatory strategies to increase word finding during conversation given min multimodal A SLP Short Term Goal 1 - Progress (Week 1): Met SLP Short Term Goal 2 (Week 1): Patient will recall and utilize memory compensatory strategies given mod multimodal A SLP Short Term Goal 2 - Progress (Week 1): Met SLP Short Term Goal 3 (Week 1): Patient will recall and utilize education regarding speech intelligibility strategies during instances of dysarthria given minA SLP Short Term Goal 3 - Progress (Week 1): Met    New Short Term Goals: Week 2: SLP Short Term Goal 1 (Week 2): STG = LTG due to ELOS  Weekly Progress Updates: Pt has made excellent gains and has met 3 of 3 STGs this reporting period due to improved word finding, memory and dysarthria. Currently, patient continues to require min A for cognitive and expressive language skills. POC upgraded due to significant progress. Patient denies any further dysarthria and SLP observed 100% intelligibility at the conversational level. Pt/family eduction ongoing. Pt would benefit from continued ST intervention to maximize cognition and word finding in order to maximize functional independence at d/c.   Intensity: Minumum of 1-2 x/day, 30 to 90 minutes Frequency: 3 to 5 out of 7 days Duration/Length of Stay: 1/14 Treatment/Interventions: Cognitive remediation/compensation;Internal/external aids;Speech/Language facilitation;Cueing hierarchy;Therapeutic Activities;Functional tasks;Multimodal communication approach;Patient/family  education;Therapeutic Exercise   Daily Session  Skilled Therapeutic Interventions:  Skilled therapy session focused on cognitive goals. Upon entrance, patient independently recalled what she consumed for breakfast and with minA, she recalled completion of job interview with SLP yesterday. To address memory goal, patient recalled 3/4 WRAP (write, repeat, associate, picture) memory strategies independently, a significant improvement from prior. To practice utilization of these strategies, SLP verbalized news stories focusing on the local weather and [place]  fires. After a 10 minute delay, patient answered comprehension questions with 100% accuracy with supervisionA. Patient left in bed with alarm set and call bell in reach. Continue POC.      Pain Pain in neck. Medical team aware.   Therapy/Group: Individual Therapy  Jamy Whyte M.A., CF-SLP 05/21/2023, 8:44 AM

## 2023-05-21 NOTE — Progress Notes (Signed)
 Occupational Therapy Session Note  Patient Details  Name: Christine Reyes MRN: [id number] Date of Birth: [birth year]  Today's Date: 05/21/2023 OT Individual Time: 9066-8966+ 8577-8484 OT Individual Time Calculation (min): 60 min    Short Term Goals: Week 1:  OT Short Term Goal 1 (Week 1): pt will be able to don underwear and pants with supervision only. OT Short Term Goal 2 (Week 1): Pt will be able to don socks and shoes with min A. OT Short Term Goal 3 (Week 1): Pt will ambulate to the toilet with RW with supervision. OT Short Term Goal 4 (Week 1): Pt will be able to engage in a room HEP using light theraband.  Skilled Therapeutic Interventions/Progress Updates:  Session 1: Pt greeted supine in bed, pt agreeable to OT intervention.      Transfers/bed mobility/functional mobility: pt completed supine>sit with supervision, ambulatory ADL transfers throughout session with rollator and supervision.    ADLs:  Grooming: pt stood at sink for oral care MODI UB dressing:pt donned [place] shirt from EOB with set- up assist LB dressing: pt donned pants and underwear with set- up assist  Footwear: pt donned shoes with mINA  Bathing: pt completed bathing at sink with supervision, MIN verbal cues for safety to remind pt to lock rollator  Transfers: ADL transfers with rollator and supervision, MIN verbal cues needed to lock brakes appropriately.  Toileting: pt completed 3/3 toileting tasks MODI, continent BM  IADLS: pt completed simulated IADL task of ambulating around room to collect wash cloths from various surface heights to simulate cleaning up around house. Pt completed task with supervision, good use of rollator to transport items.   Education: education provided on energy conservation strategies for IADLs such as using reacher to remove clothes from washer/dryer, using rollator to transport laundry items, sitting to prep meals, delegating heavy duty cleaning tasks to family members and  alternating showering vs wash up at sink. Pt agreeable and receptive to all education.   Full education provided on BEFAST and s/s of strokes. Pt very receptive and appreciative of education.   General: applied ace wraps to bilateral knees for comfort, applied muscle rub to knees and R shoulder per nursing.  Pt reports dizziness at end of session, BP from recliner at rest: 132/83(98) HR 69     Ended session with pt seated in recliner with all needs within reach and chair alarm activated.                    Session 2: Pt greeted seated in recliner, pt agreeable to OT intervention.      Transfers/bed mobility/functional mobility: pt completed functional ambulation greater than a household distance with rollator and supervision.   IADLS: pt completed simulated laundry task with pt able to retrieve linens from laundry basket and transport lines on her rollator to washer/dryer. Pt did utilize reacher to retrieve linens from deep washer. Education provided on using pods vs heavy detergent as energy conservation.   Pt additionally able to complete simulated bed making task with pt able to straighten up sheets positioned on mat table with an emphasis on dynamic balance/reaching with no UE support. Pt completed task with CGA but no LOB.   Functional cognition further assessed with The Pillbox Test: A Measure of Executive Functioning and Estimate of Medication Management. A straight pass/fail designation is determined by 3 or more errors of omission or misplacement on the task. Pt had a total of 10 errors.and failed the assessment, demonstrating  deficits with planning, mental flexibility, suboptimal search strategies, concrete thinking and difficulty with multitasking.   Educated pt on importance of taking her time when problem solving through novel task and decreasing distractions as needed.  Total time to complete task  - 10 min  Ended session with pt supine in bed with all needs within reach and  bed alarm activated.                    Therapy Documentation Precautions:  Precautions Precautions: Fall Precaution Comments: L hemipareisis, intermittent LLE pain with open-chain movement Restrictions Weight Bearing Restrictions Per Provider Order: No General:   Vital Signs:   Pain: Session 1: unrated pain reported in knees and R shoulder, ace wraps applied as well as muscle rub to shoulder.  Session 2: unrated pain reported in bilateral knees, rest breaks provided as needed.    Therapy/Group: Individual Therapy  [name] 05/21/2023, 12:19 PM

## 2023-05-21 NOTE — Progress Notes (Signed)
 Physical Therapy Session Note  Patient Details  Name: Christine Reyes MRN: [id number] Date of Birth: 1956/03/29  Today's Date: 05/21/2023 PT Individual Time: 1107-1203 PT Individual Time Calculation (min): 56 min   Short Term Goals: Week 1:  PT Short Term Goal 1 (Week 1): Pt will perform all bed mobility with Mod I from flat mattress. PT Short Term Goal 2 (Week 1): Pt will perform all functional transfers with close supervision. PT Short Term Goal 3 (Week 1): Pt will ambulate at least 130 ft using RW with close supervision. PT Short Term Goal 4 (Week 1): Pt will perform at least 8 steps with close supervision.  Skilled Therapeutic Interventions/Progress Updates:    Pt received sitting in recliner awake and eager to participate in therapy session. Sit<>stands using rollator with supervision - reinforced education on need to lock brakes when going to sit throughout session and educated on reasoning to have a stable support if needed during the transfer.   Gait training ~157ft to main therapy gym using rollator with supervision - pt opens door to exit room without assist but some difficulty due to layout of room.  Sit<>stands, no AD, with CGA/supervision throughout session.  Gait training ~3109ft (for at least 5 minutes without seated rest break) using L HHA progressed to no UE support with CGA for steadying and light min A via L HHA for balance. Pt demonstrating the following gait deviations with therapist providing the described cuing and facilitation for improvement:  - slow gait speed - slight R/L lateral trunk lean  - shuffled gait with poor bilateral foot clearance, but with cuing is able to improve weight shift onto stance phase and improve foot clearance and heel strike along with step length  Dynamic gait training of lateral side stepping ~54ft down/back x2 each direction with pt reporting increased R hip pain with this task - started with L HHA progressed to no UE support with  CGA/light min A. Pt reporting increased R hip discomfort with this exercise so performed modified assisted seated R LE figure-4 stretch and R single knee to chest x4min each exercise, but no significant alleviation of symptoms as pt describes it as a deep painful ache specifically happening when walking.  Gait training ~170ft, no UE support, with CGA focusing on carryover of improved gait mechanics of full foot clearance bilaterally, heel strike bilaterally, and improved weight shift onto stance limb. Progressed to adding R/L head rotations for added dynamic challenge with pt often pausing to turn her head and look rather than being able to continue forward gait.  Dynamic gait of backwards walking ~16ft with L HHA progressed to no UE support with light min A progressed to CGA for steadying with pt achieving reciprocal stepping pattern although at a very slow speed.  Pt reports blurring of vision throughout session and noticed when asking her to read items in hallway she often has to squint to see them more clearly - educated her on location of CVAs in occipital lobes likely impacting her vision.  Initiated additional gait training trial without AD; however, pt reports sudden urge to use bathroom so utilized rollator to ambulate back to room as described above; however, noticed pt also starts to shuffle during gait with AD so requires continuing cuing to improve. Pt completed 3/3 toileting tasks without assist and distant supervision for balance safety - unable to void with pt reporting false alarm.   Gait training additional ~133ft, no UE support, with pt having good carryover of improved  gait mechanics with good foot clearance and step length bilaterally with improving gait speed (although still decreased).   Pt excited with her progress made today and hopes to continue making improvements each day.  Pt left seated in recliner with needs in reach and chair alarm on.   Therapy  Documentation Precautions:  Precautions Precautions: Fall Precaution Comments: L hemipareisis, intermittent LLE pain with open-chain movement Restrictions Weight Bearing Restrictions Per Provider Order: No   Pain:  Reports R hip discomfort while walking, but alleviates with seated rest breaks, and pt states it isn't anything she can't endure Pt also reports the muscle rub cream is really helping her knee pain.     Therapy/Group: Individual Therapy  Connell CHRISTELLA Kiss , PT, DPT, NCS, CSRS 05/21/2023, 7:56 AM

## 2023-05-22 DIAGNOSIS — M25811 Other specified joint disorders, right shoulder: Secondary | ICD-10-CM | POA: Diagnosis not present

## 2023-05-22 DIAGNOSIS — I6350 Cerebral infarction due to unspecified occlusion or stenosis of unspecified cerebral artery: Secondary | ICD-10-CM | POA: Diagnosis not present

## 2023-05-22 DIAGNOSIS — I10 Essential (primary) hypertension: Secondary | ICD-10-CM | POA: Diagnosis not present

## 2023-05-22 NOTE — Progress Notes (Signed)
 Physical Therapy Session Note  Patient Details  Name: Christine Reyes MRN: [id number] Date of Birth: Dec 15, 1955  Today's Date: 05/22/2023 PT Individual Time: 1110-1205 PT Individual Time Calculation (min): 55 min   Short Term Goals: Week 1:  PT Short Term Goal 1 (Week 1): Pt will perform all bed mobility with Mod I from flat mattress. PT Short Term Goal 2 (Week 1): Pt will perform all functional transfers with close supervision. PT Short Term Goal 3 (Week 1): Pt will ambulate at least 130 ft using RW with close supervision. PT Short Term Goal 4 (Week 1): Pt will perform at least 8 steps with close supervision.  Skilled Therapeutic Interventions/Progress Updates: Patient sitting EOB on entrance to room. Patient alert and agreeable to PT session.   Patient reported pain in R hip 7/10 when WB, and in B knees (unrated - therex to follow bellow to assist with pt reporting decrease in pain). Session focused on family education with family primary concerns of car transfers, stairs, bed mobility and showering (addressed in OT prior). Pt had no further concerns about pt's return home, and felt confident in pt's ability with pt agreeing with statement.   Therapeutic Activity: Bed Mobility: Pt performed supine<>sit on EOB with modI. Bed elevated to mimic height at home with pt performing task safely (trial of 6 step as pt has one at home assess if pt feels safer - pt still use of previous technique). Pt also performed R and L sidelying from supine with modI (bed flat and no hand rail use). Transfers: Pt performed sit<>stand transfers throughout session with rollator and supervision. VC for pt to ensure locks are engaged.   - Car transfer performed with supervision and VC to only use top railing to simulate using top handle in car - Ramp and mulch navigation with supervision and no LOB (pt reported that pt has to step on soft ground at home) - Pt ambulated throughout session from room<ortho gym<main  gym<room using rollator with supervision.    - Ascending 4 (6) steps with use of BHR, and with supervision. Pt performed 2 rounds with step to and step through pattern. - Descending 4 (6) steps with use of BHR, and with supervision. Pt performed 2 rounds with step-pattern  - pt performed stair navigation with no issue and LOB  - Pt and pt family stated that although pt has ramp at home and doesn't use stairs, pt and family educated on ability to navigate for safety purposes.  Therapeutic Exercise: Pt performed the following exercises with therapist providing the described cuing and facilitation for improvement. - standing R hip abductor stretch standing at sink with demonstration provided - R hip abduction (stepping to R side) with PTA providing min resistance manually  - Pt reported decrease in pain  Patient sitting EOB at end of session with brakes locked, daughter signed off on transfers and all needs within reach.      Therapy Documentation Precautions:  Precautions Precautions: Fall Precaution Comments: L hemipareisis, intermittent LLE pain with open-chain movement Restrictions Weight Bearing Restrictions Per Provider Order: No  Therapy/Group: Individual Therapy  Christine Reyes 05/22/2023, 1:37 PM

## 2023-05-22 NOTE — Progress Notes (Signed)
 Physical Therapy Weekly Progress Note  Patient Details  Name: Christine Reyes MRN: [id number] Date of Birth: 1956-05-12  Beginning of progress report period: May 15, 2023 End of progress report period: May 22, 2023  Patient has met 4 of 4 short term goals. Pt is on course to meet LTGs by demonstrating decreased assistance required for all functional mobility. Pt has made significant progress since evaluation, and performs all bed mobility at ModI, transfers and ambulation with supervision/ Mod I and hsa ambulated with rollator as pt has RW and rollator at home. Family education has been completed with family and pt feeling confident for a safe return home.   Patient continues to demonstrate the following deficits muscle weakness, decreased cardiorespiratoy endurance, decreased memory, and decreased balance strategies and hemipareisis  and therefore will continue to benefit from skilled PT intervention to increase functional independence with mobility.  Patient progressing toward long term goals..  Continue plan of care.  PT Short Term Goals Week 1:  PT Short Term Goal 1 (Week 1): Pt will perform all bed mobility with Mod I from flat mattress. PT Short Term Goal 1 - Progress (Week 1): Met PT Short Term Goal 2 (Week 1): Pt will perform all functional transfers with close supervision. PT Short Term Goal 2 - Progress (Week 1): Met PT Short Term Goal 3 (Week 1): Pt will ambulate at least 130 ft using RW with close supervision. PT Short Term Goal 3 - Progress (Week 1): Met PT Short Term Goal 4 (Week 1): Pt will perform at least 8 steps with close supervision. PT Short Term Goal 4 - Progress (Week 1): Met  Skilled Therapeutic Interventions/Progress Updates:  Ambulation/gait training;Community reintegration;DME/adaptive equipment instruction;Neuromuscular re-education;Psychosocial support;Stair training;UE/LE Strength taining/ROM;Wheelchair propulsion/positioning;UE/LE Coordination  activities;Therapeutic Activities;Skin care/wound management;Pain management;Discharge planning;Balance/vestibular training;Cognitive remediation/compensation;Disease management/prevention;Functional mobility training;Patient/family education;Splinting/orthotics;Therapeutic Exercise;Visual/perceptual remediation/compensation   Therapy Documentation Precautions:  Precautions Precautions: Fall Precaution Comments: L hemipareisis, intermittent LLE pain with open-chain movement Restrictions Weight Bearing Restrictions Per Provider Order: No  Dominic Sandoval PTA 05/22/2023, 3:22 PM   [name] PT, DPT, CSRS 05/25/2023, 2:48 PM

## 2023-05-22 NOTE — Progress Notes (Signed)
 Occupational Therapy Session Note  Patient Details  Name: Christine Reyes MRN: [id number] Date of Birth: July 31, 1955  Today's Date: 05/22/2023 OT Individual Time: 1407-1505 OT Individual Time Calculation (min): 58 min    Short Term Goals: Week 2:  OT Short Term Goal 1 (Week 2): STG= LTGs  Skilled Therapeutic Interventions/Progress Updates:  Pt greeted supine in bed, pt agreeable to OT intervention.      Transfers/bed mobility/functional mobility: MODI for all bed mobility and functional mobility with Rollator  Therapeutic activity: pt completed arch activity with pt instructed to use RUE to distribute rings across arch in a semi circle pattern to facilitate improved AROM in R shoulder for IADL participation. Pt completed task with supervision.  Pt completed functional reaching task with pt placing rings on squigz placed on window with an emphasis on dynamic balance and weight shifting to simulate higher level IADLS. Pt completed task with supervision.    ADLs:  Transfers: ambulatory transfer to bathroom with rollator MODI- supervision  Toileting: MODI for 3/3 toileting tasks, continent urine void.   IADLS: pt completed various simulated IADL tasks including vacuuming, putting away groceries and wiping mirrors. Pt completed all tasks with distant supervision. Education provided throughout on energy conservation strategies.   Education: education and demo provided on ace wrapping knees for pain mgmt   Ended session with pt supine in bed with all needs within reach and bed alarm activated.                    Therapy Documentation Precautions:  Precautions Precautions: Fall Precaution Comments: L hemipareisis, intermittent LLE pain with open-chain movement Restrictions Weight Bearing Restrictions Per Provider Order: No  Pain: Unrated pain reported in bilateral knees, ace wraps provided for pain mgmt   Therapy/Group: Individual Therapy  [name] 05/22/2023, 3:20 PM

## 2023-05-22 NOTE — Progress Notes (Signed)
 Occupational Therapy Session Note  Patient Details  Name: Christine Reyes MRN: [id number] Date of Birth: 1956/04/17  Today's Date: 05/22/2023 OT Individual Time: 0930-1030 OT Individual Time Calculation (min): 60 min    Short Term Goals: Week 1:  OT Short Term Goal 1 (Week 1): pt will be able to don underwear and pants with supervision only. OT Short Term Goal 1 - Progress (Week 1): Met OT Short Term Goal 2 (Week 1): Pt will be able to don socks and shoes with min A. OT Short Term Goal 2 - Progress (Week 1): Met OT Short Term Goal 3 (Week 1): Pt will ambulate to the toilet with RW with supervision. OT Short Term Goal 3 - Progress (Week 1): Met OT Short Term Goal 4 (Week 1): Pt will be able to engage in a room HEP using light theraband. OT Short Term Goal 4 - Progress (Week 1): Met Week 2:  OT Short Term Goal 1 (Week 2): STG= LTGs  Skilled Therapeutic Interventions/Progress Updates:    Pt seen for family education session with her daughter and granddaughter.  They both observed pt complete bed mobility, transfers/ambulation, undressing, showering, toileting, dressing,  ambulation in hallway with rollator,  tub bench transfers in tub room all with supervision only.  Pt is very close to reaching her mod ind goals prior to discharge next Tuesday.   Her Left side strength has improved a great deal along with her balance.  Discussed levels of supervision recommended when pt initially discharges home for the first week or 2 with tub transfers and homemaking activities.  Discussed low vision due to CVAs and home modifications to make.    Pt resting in room with family at end of session, all needs met.   Therapy Documentation Precautions:  Precautions Precautions: Fall Precaution Comments: L hemipareisis, intermittent LLE pain with open-chain movement Restrictions Weight Bearing Restrictions Per Provider Order: No      Pain: Chronic upperneck and shoulder pain, helped pt to apply muscle  rub ADL: ADL Eating: Independent Grooming: Independent Upper Body Bathing: Independent Where Assessed-Upper Body Bathing: Shower Lower Body Bathing: Supervision/safety Where Assessed-Lower Body Bathing: Shower Upper Body Dressing: Independent Lower Body Dressing: Supervision/safety Toileting: Supervision/safety Where Assessed-Toileting: Teacher, Adult Education: Close supervision Toilet Transfer Method: Proofreader: Chiropractor Transfer: Close supervison Web Designer Method: Ship Broker: Emergency planning/management officer, Acupuncturist: Close supervision Film/video Editor Method: Designer, Industrial/product: Emergency planning/management officer, Grab bars   Therapy/Group: Individual Therapy  Kylynn Street 05/22/2023, 12:21 PM

## 2023-05-22 NOTE — Progress Notes (Signed)
 PROGRESS NOTE   Subjective/Complaints: Discussed d/c date , also that goals are mod I /Sup for basic ADLs and mobility No driving until cleared at subsequent OP visit s  B knee pain , partial relief with bengay cream, states voltaren  gel not helpful   ROS: as per HPI. Denies CP, SOB, abd pain, N/V/D/C, or any other complaints at this time.     Objective:   No results found.  No results for input(s): WBC, HGB, HCT, PLT in the last 72 hours.  No results for input(s): NA, K, CL, CO2, GLUCOSE, BUN, CREATININE, CALCIUM  in the last 72 hours.   Intake/Output Summary (Last 24 hours) at 05/22/2023 0638 Last data filed at 05/21/2023 1900 Gross per 24 hour  Intake 720 ml  Output --  Net 720 ml        Physical Exam: Vital Signs Blood pressure 139/73, pulse 64, temperature 98.6 F (37 C), resp. rate 16, height 4' 11 (1.499 m), weight 85.5 kg, SpO2 100%.  General: No acute distress Mood and affect are appropriate Heart: Regular rate and rhythm no rubs murmurs or extra sounds Lungs: Clear to auscultation, breathing unlabored, no rales or wheezes Abdomen: Positive bowel sounds, soft nontender to palpation, nondistended Extremities: No clubbing, cyanosis, or edema Skin: No evidence of breakdown, no evidence of rash  Neuro:  Alert and oriented x 3. Fair insight and awareness. Fair Memory. Normal language and speech. Cranial nerve exam unremarkable. MMT: 4/5 in bilateral Delt bi, tri, 3- HF 3- KE 4/5 B ADF and PF  Decreased FMC, mild ataxia LUE>RUE and LLE>RLE. Musculoskeletal: Both traps tender along anterior border with palpation, left side > right  + impingement sign in Right shoulder    Assessment/Plan: 1. Functional deficits which require 3+ hours per day of interdisciplinary therapy in a comprehensive inpatient rehab setting. Physiatrist is providing close team supervision and 24 hour management of  active medical problems listed below. Physiatrist and rehab team continue to assess barriers to discharge/monitor patient progress toward functional and medical goals  Care Tool:  Bathing    Body parts bathed by patient: Left arm, Right arm, Chest, Abdomen, Front perineal area, Buttocks, Right upper leg, Face, Left upper leg, Right lower leg, Left lower leg   Body parts bathed by helper: Right lower leg, Left lower leg     Bathing assist Assist Level: Supervision/Verbal cueing     Upper Body Dressing/Undressing Upper body dressing   What is the patient wearing?: Pull over shirt, Bra    Upper body assist Assist Level: Set up assist    Lower Body Dressing/Undressing Lower body dressing      What is the patient wearing?: Underwear/pull up, Pants     Lower body assist Assist for lower body dressing: Supervision/Verbal cueing     Toileting Toileting    Toileting assist Assist for toileting: Independent with assistive device     Transfers Chair/bed transfer  Transfers assist     Chair/bed transfer assist level: Contact Guard/Touching assist     Locomotion Ambulation   Ambulation assist      Assist level: Minimal Assistance - Patient > 75% Assistive device: Walker-rolling Max distance: 75 ft  Walk 10 feet activity   Assist     Assist level: Contact Guard/Touching assist Assistive device: Walker-rolling   Walk 50 feet activity   Assist    Assist level: Minimal Assistance - Patient > 75% Assistive device: Walker-rolling    Walk 150 feet activity   Assist Walk 150 feet activity did not occur: Safety/medical concerns         Walk 10 feet on uneven surface  activity   Assist Walk 10 feet on uneven surfaces activity did not occur: Safety/medical concerns         Wheelchair     Assist Is the patient using a wheelchair?: Yes Type of Wheelchair: Manual    Wheelchair assist level: Dependent - Patient 0% Max wheelchair distance:  300 ft    Wheelchair 50 feet with 2 turns activity    Assist        Assist Level: Dependent - Patient 0%   Wheelchair 150 feet activity     Assist      Assist Level: Dependent - Patient 0%   Blood pressure 139/73, pulse 64, temperature 98.6 F (37 C), resp. rate 16, height 4' 11 (1.499 m), weight 85.5 kg, SpO2 100%.  Medical Problem List and Plan: 1. 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  Antithrombotics: -DVT/anticoagulation:  Pharmaceutical: Lovenox  40mg  daily- per PT note, amb 30' with walker will d/c lovenox   -antiplatelet therapy: Aspirin  81 mg daily and Plavix  75 mg day x 3 weeks (last day 1/16)  then aspirin  alone 3. Pain Management: Neurontin  300 mg 3 times daily -baclofen  5 mg 3 times daily prn muscle spasms -Voltaren  4 g 4 times daily as needed--change to [employer] -oxycodone  5 mg every 6 hours as needed(currently taking 2-3 tabs/24h)- pt states home dose is 20mg  BID- will f/u with PCP -Change tylenol  from scheduled to prn  Xray Right shoulder -  R AC jt OA 4. Mood/Behavior/Sleep: Trazodone  100 mg nightly.  Provide emotional support             -antipsychotic agents: N/A 5. Neuropsych/cognition: This patient is not quite capable of making decisions on her own behalf. 6. Skin/Wound Care: Routine skin checks 7. Fluids/Electrolytes/Nutrition: Routine in and outs  -encourage liquids, po intake  -labs from 12/30 without significant abnl    -recheck Monday  8.  Hyponatremia.  Follow-up chemistries 9.  Hyperlipidemia.  Lipitor 40mg  daily 10.  Obesity.  History of gastric bypass.  BMI 39.77.  Dietary follow-up 11.  GERD.  Protonix  40mg  daily 12.  History of iron  deficiency anemia.     -recent hgb 11.0 on 12/30--recheck Monday  -fe++ supp 13.  Permissive hypertension.   Patient on Benicar  HCT 40-25 mg tablet daily prior to admission.  Resume as needed  -bp soft despite being off all meds  -encourage fluids  -1/4-5/25 BPs improving, cont monitoring Vitals:   05/17/23 2028 05/18/23 0419 05/18/23 1533 05/18/23 1949  BP: 115/71 110/66 107/63 114/68   05/19/23 0537 05/19/23 1957 05/20/23 0527 05/20/23 1440  BP: (!) 117/57 117/70 (!) 123/49 124/67   05/21/23 0522 05/21/23 1312 05/21/23 1944 05/22/23 0458  BP: 123/66 (!) 154/76 124/70 139/73     Monitor off meds 1/10  LOS: 8 days A FACE  TO FACE EVALUATION WAS PERFORMED  Prentice FORBES Compton 05/22/2023, 6:38 AM

## 2023-05-22 NOTE — Progress Notes (Signed)
 Speech Language Pathology Daily Session Note  Patient Details  Name: Christine Reyes MRN: [id number] Date of Birth: 19-Apr-1956  Today's Date: 05/22/2023 SLP Individual Time: 0900-0930 SLP Individual Time Calculation (min): 30 min  Short Term Goals: Week 2: SLP Short Term Goal 1 (Week 2): STG = LTG due to ELOS  Skilled Therapeutic Interventions: Skilled therapy session focused on family education. SLP educated patients daughter and grand-daughter on patients current level of cognitive functioning and memory strategies taught in therapy. SLP further educated family on patients occasional anomia and subsequent strategies taught to aid in communication/word finding skills. Handouts were provided to support all education provided. Patients family verbalized understanding with no further questions. Patient left in bed with alarm set and call bell in reach. Continue POC.  Pain Headache, nursing aware  Therapy/Group: Individual Therapy  Tavius Turgeon M.A., CF-SLP 05/22/2023, 7:31 AM

## 2023-05-22 NOTE — Progress Notes (Signed)
 Occupational Therapy Weekly Progress Note  Patient Details  Name: Christine Reyes MRN: [id number] Date of Birth: 01/17/1956  Beginning of progress report period: May 15, 2023 End of progress report period: May 22, 2023  Today's Date: 05/22/2023     Patient has met 4 of 4 short term goals.  Pt making excellent progress towards OT goals with pt completing ADLs at overall supervision level. Pt continues pt present with increased pain in R shoulder and decreased activity tolerance. Pt to DC on 1/14 with pt having intermittent assistance from family members. Continue with POC.   Patient continues to demonstrate the following deficits: decreased coordination and decreased L side strength and decreased visual acuity and therefore will continue to benefit from skilled OT intervention to enhance overall performance with BADL and iADL.  Patient progressing toward long term goals..  Continue plan of care.  OT Short Term Goals Week 1:  OT Short Term Goal 1 (Week 1): pt will be able to don underwear and pants with supervision only. OT Short Term Goal 1 - Progress (Week 1): Met OT Short Term Goal 2 (Week 1): Pt will be able to don socks and shoes with min A. OT Short Term Goal 2 - Progress (Week 1): Met OT Short Term Goal 3 (Week 1): Pt will ambulate to the toilet with RW with supervision. OT Short Term Goal 3 - Progress (Week 1): Met OT Short Term Goal 4 (Week 1): Pt will be able to engage in a room HEP using light theraband. OT Short Term Goal 4 - Progress (Week 1): Met Week 2:  OT Short Term Goal 1 (Week 2): STG= LTGs    Therapy Documentation Precautions:  Precautions Precautions: Fall Precaution Comments: L hemipareisis, intermittent LLE pain with open-chain movement Restrictions Weight Bearing Restrictions Per Provider Order: No    Therapy/Group: Individual Therapy  Ronal Gift [facility] 05/22/2023, 8:00 AM

## 2023-05-22 NOTE — Progress Notes (Signed)
 Patient ID: Christine Reyes, female   DOB: 12/27/1955, 68 y.o.   MRN: [id number]  SW met with pt in room to discuss [facility] or outpatient. Pt reports after family edu she is comfortable with pursing outpatient therapies. Prefers [facility]. Pt states her niece has a rollator and her friend will ship her a TTB.   Graeme [name], MSW, LCSW Office: [phone] Cell: [phone] Fax: [phone]

## 2023-05-23 DIAGNOSIS — I10 Essential (primary) hypertension: Secondary | ICD-10-CM | POA: Diagnosis not present

## 2023-05-23 DIAGNOSIS — I6350 Cerebral infarction due to unspecified occlusion or stenosis of unspecified cerebral artery: Secondary | ICD-10-CM | POA: Diagnosis not present

## 2023-05-23 NOTE — Progress Notes (Signed)
 Speech Language Pathology Daily Session Note  Patient Details  Name: Christine Reyes MRN: [id number] Date of Birth: 06-14-1955  Today's Date: 05/23/2023 SLP Individual Time: 0200-0300 SLP Individual Time Calculation (min): 60 min  Short Term Goals: Week 2: SLP Short Term Goal 1 (Week 2): STG = LTG due to ELOS  Skilled Therapeutic Interventions:  Pt seen for ST targeting speech and cognition goals. Pt lying in bed upon SLP arrival, reported 7/10 R shoulder pain. SLP informed nursing.  Pt recalled memory strategy acronym independently and with additional wait time, she recalled 4/4 strategies. During functional working memory tasks related to scheduling appointments, pt had 75% acc when provided repetition and use of WRAP strategy. During picture description tasks, pt had anomia in 1/5 opportunities and required min verbal cues to use circumlocution. Semantic and phonemic cues were not successful this date. In conversation, she used word finding strategies when provided min verbal cueing. Pt left in bed with call bell in reach and alarm active. Continue ST POC.   Pain Pain Assessment Pain Scale: 0-10 Pain Score: 7  Faces Pain Scale: No hurt Pain Location: Shoulder Pain Orientation: Right Pain Descriptors / Indicators: Aching  Therapy/Group: Individual Therapy  Waddell [name] 05/23/2023, 4:57 PM

## 2023-05-23 NOTE — Progress Notes (Signed)
 PROGRESS NOTE   Subjective/Complaints:  Pt doing well, slept well, pain controlled, LBM just prior to eval, urinating fine. Denies any other complaints or concerns today.   ROS: as per HPI. Denies CP, SOB, abd pain, N/V/D/C, or any other complaints at this time.     Objective:   No results found.  No results for input(s): WBC, HGB, HCT, PLT in the last 72 hours.  No results for input(s): NA, K, CL, CO2, GLUCOSE, BUN, CREATININE, CALCIUM  in the last 72 hours.   Intake/Output Summary (Last 24 hours) at 05/23/2023 1035 Last data filed at 05/23/2023 0750 Gross per 24 hour  Intake 720 ml  Output --  Net 720 ml        Physical Exam: Vital Signs Blood pressure (!) 118/58, pulse 72, temperature 98.7 F (37.1 C), resp. rate 16, height 4' 11 (1.499 m), weight 85.5 kg, SpO2 100%.  General: No acute distress, resting in bed.  Mood and affect are appropriate Heart: Regular rate and rhythm no rubs murmurs or extra sounds Lungs: Clear to auscultation, breathing unlabored, no rales or wheezes Abdomen: Positive bowel sounds, soft nontender to palpation, nondistended Extremities: No clubbing, cyanosis, or edema Skin: No evidence of breakdown, no evidence of rash over exposed surfaces  PRIOR EXAMS: Neuro:  Alert and oriented x 3. Fair insight and awareness. Fair Memory. Normal language and speech. Cranial nerve exam unremarkable. MMT: 4/5 in bilateral Delt bi, tri, 3- HF 3- KE 4/5 B ADF and PF  Decreased FMC, mild ataxia LUE>RUE and LLE>RLE. Musculoskeletal: Both traps tender along anterior border with palpation, left side > right  + impingement sign in Right shoulder    Assessment/Plan: 1. Functional deficits which require 3+ hours per day of interdisciplinary therapy in a comprehensive inpatient rehab setting. Physiatrist is providing close team supervision and 24 hour management of active medical  problems listed below. Physiatrist and rehab team continue to assess barriers to discharge/monitor patient progress toward functional and medical goals  Care Tool:  Bathing    Body parts bathed by patient: Left arm, Right arm, Chest, Abdomen, Front perineal area, Buttocks, Right upper leg, Face, Left upper leg, Right lower leg, Left lower leg   Body parts bathed by helper: Right lower leg, Left lower leg     Bathing assist Assist Level: Supervision/Verbal cueing     Upper Body Dressing/Undressing Upper body dressing   What is the patient wearing?: Pull over shirt, Bra    Upper body assist Assist Level: Set up assist    Lower Body Dressing/Undressing Lower body dressing      What is the patient wearing?: Underwear/pull up, Pants     Lower body assist Assist for lower body dressing: Supervision/Verbal cueing     Toileting Toileting    Toileting assist Assist for toileting: Independent with assistive device     Transfers Chair/bed transfer  Transfers assist     Chair/bed transfer assist level: Independent with assistive device     Locomotion Ambulation   Ambulation assist      Assist level: Supervision/Verbal cueing Assistive device: Rollator Max distance: 200   Walk 10 feet activity   Assist  Assist level: Supervision/Verbal cueing Assistive device: Rollator   Walk 50 feet activity   Assist    Assist level: Supervision/Verbal cueing Assistive device: Rollator    Walk 150 feet activity   Assist Walk 150 feet activity did not occur: Safety/medical concerns  Assist level: Supervision/Verbal cueing Assistive device: Rollator    Walk 10 feet on uneven surface  activity   Assist Walk 10 feet on uneven surfaces activity did not occur: Safety/medical concerns   Assist level: Supervision/Verbal cueing Assistive device: Rollator   Wheelchair     Assist Is the patient using a wheelchair?: No Type of Wheelchair: Manual     Wheelchair assist level: Dependent - Patient 0% Max wheelchair distance: 300 ft    Wheelchair 50 feet with 2 turns activity    Assist        Assist Level: Dependent - Patient 0%   Wheelchair 150 feet activity     Assist      Assist Level: Dependent - Patient 0%   Blood pressure (!) 118/58, pulse 72, temperature 98.7 F (37.1 C), resp. rate 16, height 4' 11 (1.499 m), weight 85.5 kg, SpO2 100%.  Medical Problem List and Plan: 1. 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  Antithrombotics: -DVT/anticoagulation:  Pharmaceutical: Lovenox  40mg  daily- per PT note, amb 30' with walker will d/c lovenox   -antiplatelet therapy: Aspirin  81 mg daily and Plavix  75 mg day x 3 weeks (last day 1/16)  then aspirin  alone 3. Pain Management: Neurontin  300 mg 3 times daily -baclofen  5 mg 3 times daily prn muscle spasms -Voltaren  4 g 4 times daily as needed--change to [employer] -oxycodone  5 mg every 6 hours as needed(currently taking 2-3 tabs/24h)- pt states home dose is 20mg  BID- will f/u with PCP -Change tylenol  from scheduled to prn  Xray Right shoulder -  R AC jt OA 4. Mood/Behavior/Sleep: Trazodone  100 mg nightly.  Provide emotional support             -antipsychotic agents: N/A 5. Neuropsych/cognition: This patient is not quite capable of making decisions on her own behalf. 6. Skin/Wound Care: Routine skin checks 7. Fluids/Electrolytes/Nutrition: Routine in and outs  -encourage liquids, po intake  -labs from 12/30 without significant abnl   8.  Hyponatremia. Improved.  9.  Hyperlipidemia.  Lipitor 40mg  daily 10.  Obesity.  History of gastric bypass.  BMI 39.77.  Dietary follow-up 11.  GERD.  Protonix  40mg  daily 12.  History of iron  deficiency anemia.     -recent hgb 11.0 on 12/30--recheck  Monday-- stable  -fe++ supp 13.  Permissive hypertension.  Patient on Benicar  HCT 40-25 mg tablet daily prior to admission.  Resume as needed  -bp soft despite being off all meds  -encourage fluids  -05/23/23 BP stable, monitor Vitals:   05/18/23 1949 05/19/23 0537 05/19/23 1957 05/20/23 0527  BP: 114/68 (!) 117/57 117/70 (!) 123/49   05/20/23 1440 05/21/23 0522 05/21/23 1312 05/21/23 1944  BP: 124/67 123/66 (!) 154/76 124/70   05/22/23 0458 05/22/23 1316 05/22/23 1925 05/23/23 0505  BP: 139/73 127/64 103/61 (!) 118/58     LOS: 9 days A FACE TO FACE EVALUATION WAS PERFORMED  [address] 05/23/2023, 10:35 AM

## 2023-05-24 DIAGNOSIS — I6350 Cerebral infarction due to unspecified occlusion or stenosis of unspecified cerebral artery: Secondary | ICD-10-CM | POA: Diagnosis not present

## 2023-05-24 DIAGNOSIS — I10 Essential (primary) hypertension: Secondary | ICD-10-CM | POA: Diagnosis not present

## 2023-05-24 NOTE — Progress Notes (Signed)
 PROGRESS NOTE   Subjective/Complaints:  Pt doing well again, slept well, pain controlled, LBM yesterday, urinating fine. Denies any other complaints or concerns today.   ROS: as per HPI. Denies CP, SOB, abd pain, N/V/D/C, or any other complaints at this time.     Objective:   No results found.  No results for input(s): WBC, HGB, HCT, PLT in the last 72 hours.  No results for input(s): NA, K, CL, CO2, GLUCOSE, BUN, CREATININE, CALCIUM  in the last 72 hours.   Intake/Output Summary (Last 24 hours) at 05/24/2023 0955 Last data filed at 05/24/2023 0700 Gross per 24 hour  Intake 592 ml  Output --  Net 592 ml        Physical Exam: Vital Signs Blood pressure 138/84, pulse 67, temperature 98.6 F (37 C), resp. rate 17, height 4' 11 (1.499 m), weight 85.5 kg, SpO2 98%.  General: No acute distress, resting in bed.  Mood and affect are appropriate, listening to her phone Heart: Regular rate and rhythm no rubs murmurs or extra sounds Lungs: Clear to auscultation, breathing unlabored, no rales or wheezes Abdomen: Positive bowel sounds, soft nontender to palpation, nondistended Extremities: No clubbing, cyanosis, or edema Skin: No evidence of breakdown, no evidence of rash over exposed surfaces  PRIOR EXAMS: Neuro:  Alert and oriented x 3. Fair insight and awareness. Fair Memory. Normal language and speech. Cranial nerve exam unremarkable. MMT: 4/5 in bilateral Delt bi, tri, 3- HF 3- KE 4/5 B ADF and PF  Decreased FMC, mild ataxia LUE>RUE and LLE>RLE. Musculoskeletal: Both traps tender along anterior border with palpation, left side > right  + impingement sign in Right shoulder    Assessment/Plan: 1. Functional deficits which require 3+ hours per day of interdisciplinary therapy in a comprehensive inpatient rehab setting. Physiatrist is providing close team supervision and 24 hour management of active  medical problems listed below. Physiatrist and rehab team continue to assess barriers to discharge/monitor patient progress toward functional and medical goals  Care Tool:  Bathing    Body parts bathed by patient: Left arm, Right arm, Chest, Abdomen, Front perineal area, Buttocks, Right upper leg, Face, Left upper leg, Right lower leg, Left lower leg   Body parts bathed by helper: Right lower leg, Left lower leg     Bathing assist Assist Level: Supervision/Verbal cueing     Upper Body Dressing/Undressing Upper body dressing   What is the patient wearing?: Pull over shirt, Bra    Upper body assist Assist Level: Set up assist    Lower Body Dressing/Undressing Lower body dressing      What is the patient wearing?: Underwear/pull up, Pants     Lower body assist Assist for lower body dressing: Supervision/Verbal cueing     Toileting Toileting    Toileting assist Assist for toileting: Independent with assistive device     Transfers Chair/bed transfer  Transfers assist     Chair/bed transfer assist level: Independent with assistive device     Locomotion Ambulation   Ambulation assist      Assist level: Supervision/Verbal cueing Assistive device: Rollator Max distance: 200   Walk 10 feet activity   Assist  Assist level: Supervision/Verbal cueing Assistive device: Rollator   Walk 50 feet activity   Assist    Assist level: Supervision/Verbal cueing Assistive device: Rollator    Walk 150 feet activity   Assist Walk 150 feet activity did not occur: Safety/medical concerns  Assist level: Supervision/Verbal cueing Assistive device: Rollator    Walk 10 feet on uneven surface  activity   Assist Walk 10 feet on uneven surfaces activity did not occur: Safety/medical concerns   Assist level: Supervision/Verbal cueing Assistive device: Rollator   Wheelchair     Assist Is the patient using a wheelchair?: No Type of Wheelchair:  Manual    Wheelchair assist level: Dependent - Patient 0% Max wheelchair distance: 300 ft    Wheelchair 50 feet with 2 turns activity    Assist        Assist Level: Dependent - Patient 0%   Wheelchair 150 feet activity     Assist      Assist Level: Dependent - Patient 0%   Blood pressure 138/84, pulse 67, temperature 98.6 F (37 C), resp. rate 17, height 4' 11 (1.499 m), weight 85.5 kg, SpO2 98%.  Medical Problem List and Plan: 1. 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  Antithrombotics: -DVT/anticoagulation:  Pharmaceutical: Lovenox  40mg  daily- per PT note, amb 30' with walker will d/c lovenox   -antiplatelet therapy: Aspirin  81 mg daily and Plavix  75 mg day x 3 weeks (last day 1/16)  then aspirin  alone 3. Pain Management: Neurontin  300 mg 3 times daily -baclofen  5 mg 3 times daily prn muscle spasms -Voltaren  4 g 4 times daily as needed--change to [employer] -oxycodone  5 mg every 6 hours as needed(currently taking 2-3 tabs/24h)- pt states home dose is 20mg  BID- will f/u with PCP -Change tylenol  from scheduled to prn  Xray Right shoulder -  R AC jt OA 4. Mood/Behavior/Sleep: Trazodone  100 mg nightly.  Provide emotional support             -antipsychotic agents: N/A 5. Neuropsych/cognition: This patient is not quite capable of making decisions on her own behalf. 6. Skin/Wound Care: Routine skin checks 7. Fluids/Electrolytes/Nutrition: Routine in and outs  -encourage liquids, po intake  -labs from 12/30 without significant abnl   8.  Hyponatremia. Improved.  9.  Hyperlipidemia.  Lipitor 40mg  daily 10.  Obesity.  History of gastric bypass.  BMI 39.77.  Dietary follow-up 11.  GERD.  Protonix  40mg  daily 12.  History of iron  deficiency anemia.     -recent hgb 11.0 on 12/30--recheck  Monday-- stable  -fe++ supp 13.  Permissive hypertension.  Patient on Benicar  HCT 40-25 mg tablet daily prior to admission.  Resume as needed  -bp soft despite being off all meds  -encourage fluids  -1/11-12/25 BP stable, monitor Vitals:   05/20/23 0527 05/20/23 1440 05/21/23 0522 05/21/23 1312  BP: (!) 123/49 124/67 123/66 (!) 154/76   05/21/23 1944 05/22/23 0458 05/22/23 1316 05/22/23 1925  BP: 124/70 139/73 127/64 103/61   05/23/23 0505 05/23/23 1347 05/23/23 1935 05/24/23 0405  BP: (!) 118/58 125/78 139/82 138/84     LOS: 10 days A FACE TO FACE EVALUATION WAS PERFORMED  [address] 05/24/2023, 9:55 AM

## 2023-05-25 ENCOUNTER — Other Ambulatory Visit (HOSPITAL_COMMUNITY): Payer: Self-pay

## 2023-05-25 ENCOUNTER — Encounter: Payer: Self-pay | Admitting: Oncology

## 2023-05-25 DIAGNOSIS — I10 Essential (primary) hypertension: Secondary | ICD-10-CM | POA: Diagnosis not present

## 2023-05-25 DIAGNOSIS — M25811 Other specified joint disorders, right shoulder: Secondary | ICD-10-CM | POA: Diagnosis not present

## 2023-05-25 DIAGNOSIS — I6350 Cerebral infarction due to unspecified occlusion or stenosis of unspecified cerebral artery: Secondary | ICD-10-CM | POA: Diagnosis not present

## 2023-05-25 LAB — BASIC METABOLIC PANEL
Anion gap: 8 (ref 5–15)
BUN: 13 mg/dL (ref 8–23)
CO2: 23 mmol/L (ref 22–32)
Calcium: 8.1 mg/dL — ABNORMAL LOW (ref 8.9–10.3)
Chloride: 107 mmol/L (ref 98–111)
Creatinine, Ser: 0.91 mg/dL (ref 0.44–1.00)
GFR, Estimated: >60 mL/min (ref >60)
Glucose, Bld: 75 mg/dL (ref 70–99)
Potassium: 3.9 mmol/L (ref 3.5–5.1)
Sodium: 138 mmol/L (ref 135–145)

## 2023-05-25 LAB — CBC
HCT: 33.7 % — ABNORMAL LOW (ref 36.0–46.0)
Hemoglobin: 11 g/dL — ABNORMAL LOW (ref 12.0–15.0)
MCH: 31.4 pg (ref 26.0–34.0)
MCHC: 32.6 g/dL (ref 30.0–36.0)
MCV: 96.3 fL (ref 80.0–100.0)
Platelets: 254 10*3/uL (ref 150–400)
RBC: 3.5 MIL/uL — ABNORMAL LOW (ref 3.87–5.11)
RDW: 12.2 % (ref 11.5–15.5)
WBC: 7.5 10*3/uL (ref 4.0–10.5)
nRBC: 0 % (ref 0.0–0.2)

## 2023-05-25 MED ORDER — GABAPENTIN 300 MG PO CAPS
300.0000 mg | ORAL_CAPSULE | Freq: Two times a day (BID) | ORAL | 0 refills | Status: AC
  Filled 2023-05-25: qty 60, 30d supply, fill #0

## 2023-05-25 MED ORDER — BACLOFEN 5 MG PO TABS
5.0000 mg | ORAL_TABLET | Freq: Three times a day (TID) | ORAL | 0 refills | Status: AC | PRN
  Filled 2023-05-25: qty 30, 10d supply, fill #0

## 2023-05-25 MED ORDER — OLMESARTAN MEDOXOMIL-HCTZ 40-25 MG PO TABS
1.0000 | ORAL_TABLET | Freq: Every day | ORAL | 0 refills | Status: AC
  Filled 2023-05-25: qty 30, 30d supply, fill #0

## 2023-05-25 MED ORDER — CLOPIDOGREL BISULFATE 75 MG PO TABS
75.0000 mg | ORAL_TABLET | Freq: Every day | ORAL | 0 refills | Status: AC
  Filled 2023-05-25: qty 2, 2d supply, fill #0

## 2023-05-25 MED ORDER — TRAZODONE HCL 100 MG PO TABS
100.0000 mg | ORAL_TABLET | Freq: Every day | ORAL | 0 refills | Status: AC
  Filled 2023-05-25: qty 30, 30d supply, fill #0

## 2023-05-25 MED ORDER — OXYCODONE HCL 5 MG PO TABS
5.0000 mg | ORAL_TABLET | Freq: Four times a day (QID) | ORAL | 0 refills | Status: AC | PRN
  Filled 2023-05-25: qty 30, 8d supply, fill #0

## 2023-05-25 MED ORDER — FOLIC ACID 1 MG PO TABS
1.0000 mg | ORAL_TABLET | Freq: Every day | ORAL | 0 refills | Status: AC
  Filled 2023-05-25: qty 30, 30d supply, fill #0

## 2023-05-25 MED ORDER — PANTOPRAZOLE SODIUM 40 MG PO TBEC
40.0000 mg | DELAYED_RELEASE_TABLET | Freq: Every day | ORAL | 0 refills | Status: AC
  Filled 2023-05-25: qty 30, 30d supply, fill #0

## 2023-05-25 MED ORDER — ATORVASTATIN CALCIUM 40 MG PO TABS
40.0000 mg | ORAL_TABLET | Freq: Every day | ORAL | 0 refills | Status: DC
  Filled 2023-05-25: qty 30, 30d supply, fill #0

## 2023-05-25 NOTE — Progress Notes (Signed)
 Speech Language Pathology Discharge Summary  Patient Details  Name: Christine Reyes MRN: [id number] Date of Birth: [birth year]  Date of Discharge from SLP service:May 25, 2023  Today's Date: 05/25/2023 SLP Individual Time: 0800-0900 SLP Individual Time Calculation (min): 60 min   Skilled Therapeutic Interventions: Patient was seen in am to address cognitive re- training. Pt was alert and seen at bedside. She was agreeable for session. SLP reviewed WRAP compensatory strategies and examples of utilization. SLP challenged pt this date in recall of therapy schedule. After a 40 minute distracted delay, she recalled therapy schedule with 100% acc with sup A. Additional education initiated on carryover of strategies upon discharge home. Discussion completed on internal and external memory strategies with SLP initiating conversation about use of MyChart and notes app as external aids. SLP assisted pt in set up of MyChart and provided brief overview of navigating app. SLP also reviewed uses of notes app on phone and relocated to a spot where pt could easily access it. Pt demo teach back of MyChart by logging in as well as locating username within emails with sup A. At conclusion of session, pt was left at bedside with call button within reach and bed alarm active.   Patient has met 3 of 3 long term goals.  Patient to discharge at overall Supervision level.  Reasons goals not met:     Clinical Impression/Discharge Summary: Pt has made excellent gains and ahs met 3 of 3 LTGs this admission due to improved word finding and recall. Pt is currently an overall supervision for cognitive tasks and expressive language. Pt/ family education complete and pt will discharge home with intermittent supervision from family. No further skilled SLP intervention warranted at this time.  Care Partner:  Caregiver Able to Provide Assistance: Yes  Type of Caregiver Assistance: Cognitive  Recommendation:   none  Equipment: none   Reasons for discharge: Treatment goals met;Discharged from hospital   Patient/Family Agrees with Progress Made and Goals Achieved: Yes    Joane GORMAN Fuss 05/25/2023, 5:01 PM

## 2023-05-25 NOTE — Progress Notes (Addendum)
 Physical Therapy Session Note  Patient Details  Name: Christine Reyes MRN: [id number] Date of Birth: [birth year]  Today's Date: 05/25/2023 PT Individual Time: 1030-1102 PT Individual Time Calculation (min): 32 min   Short Term Goals: Week 1:  PT Short Term Goal 1 (Week 1): Pt will perform all bed mobility with Mod I from flat mattress. PT Short Term Goal 1 - Progress (Week 1): Met PT Short Term Goal 2 (Week 1): Pt will perform all functional transfers with close supervision. PT Short Term Goal 2 - Progress (Week 1): Met PT Short Term Goal 3 (Week 1): Pt will ambulate at least 130 ft using RW with close supervision. PT Short Term Goal 3 - Progress (Week 1): Met PT Short Term Goal 4 (Week 1): Pt will perform at least 8 steps with close supervision. PT Short Term Goal 4 - Progress (Week 1): Met Week 2:  PT Short Term Goal 1 (Week 2): STG = LTG d/t ELOS  Skilled Therapeutic Interventions/Progress Updates:  Patient supine in bed on entrance to room. Patient alert and agreeable to PT session.   Patient with no pain complaint at start of session.  Therapeutic Activity: Bed Mobility: Pt performed supine <> sit with Mod I. No vc for technique. Transfers: Pt performed sit<>stand and stand pivot transfers throughout session with distant supervision/ Mod I. No cues required for technique.  Gait Training:  Pt ambulated ~200 ft using RW with supervision/ Mod I. Pt relates easier use of rollator.  Demonstrated improving confidence in stance/ stride bilaterally.   Completes 12 steps using BHR and step-to progression leading with RLE that improves to reciprocating step to ascend/ descend. Definite need for BUE support but safe overall.   5xSTS completed in 35.34 sec without use of AD.  Pt relates overall confidence has improved and feels more ready to return home compared to previous week.   Patient supine in bed at end of session with brakes locked, bed alarm set, and all needs within  reach.   Therapy Documentation Precautions:  Precautions Precautions: Fall Precaution Comments: L hemipareisis, intermittent LLE pain with open-chain movement Restrictions Weight Bearing Restrictions Per Provider Order: No  Pain: Pt with c/o pain in R shoulder and Bil knees from OA. Addressed with repositioning and k-pad at end of session. Unrated pain levels.    Therapy/Group: Individual Therapy  [name] PT, DPT, CSRS 05/25/2023, 1:52 PM

## 2023-05-25 NOTE — Progress Notes (Signed)
 Inpatient Rehabilitation Discharge Medication Review by a Pharmacist  A complete drug regimen review was completed for this patient to identify any potential clinically significant medication issues.  High Risk Drug Classes Is patient taking? Indication by Medication  Antipsychotic No   Anticoagulant No   Antibiotic No   Opioid Yes Oxycodone - pain  Antiplatelet Yes Aspirin , plavix  for 3 weeks (05/08/23 through 05/28/23), followed by Aspirin  alone - acute embolic CVA  Hypoglycemics/insulin No   Vasoactive Medication Yes Benicar -HCT - HTN  Chemotherapy No   Other Yes Acetaminophen  -pain Atorvastatin - HLD Baclofen - spasms Folic acid - supplement Gabapentin - pain Pantoprazole -GERD Trazodone - for sleep Iron -vit C - supplement     Type of Medication Issue Identified Description of Issue Recommendation(s)  Drug Interaction(s) (clinically significant)     Duplicate Therapy     Allergy     No Medication Administration End Date     Incorrect Dose     Additional Drug Therapy Needed     Significant med changes from prior encounter (inform family/care partners about these prior to discharge).    Other       Clinically significant medication issues were identified that warrant physician communication and completion of prescribed/recommended actions by midnight of the next day:  No  Name of provider notified for urgent issues identified:   Provider Method of Notification:     Pharmacist comments:   Enter stop date for Plavix  to continue through 05/28/23 then stop.  Time spent performing this drug regimen review (minutes):     Levorn Gaskins, RPh Clinical Pharmacist 05/25/2023 9:21 AM

## 2023-05-25 NOTE — Progress Notes (Signed)
 Occupational Therapy Discharge Summary  Patient Details  Name: Christine Reyes MRN: [id number] Date of Birth: 07-21-1955  Date of Discharge from OT service:May 25, 2023  Today's Date: 05/25/2023 OT Individual Time: 1445-1530 OT Individual Time Calculation (min): 45 min   Pt seen this session for ADL training and assessment to ensure her balance and L side strength has improved to enable her to be Mod ind in the room. Pt completed all self care tasks of toileting, shower transfers, shower, dressing, grooming with no assist and mod Ind using 4WW for support.  Pt is now mod I in her room.   Patient has met 9 of 9 long term goals due to improved activity tolerance, improved balance, ability to compensate for deficits, functional use of  LEFT upper and LEFT lower extremity, and improved coordination.  Patient to discharge at overall Modified Independent level except for supervision with tub bench transfers.   Patient's care partner is independent to provide the necessary physical assistance at discharge.  Pt's daughter and granddaughter attended family education.   Reasons goals not met: n/a  Recommendation:  Patient will benefit from ongoing skilled OT services in outpatient setting to continue to advance functional skills in the area of BADL and iADL.  Equipment: No equipment provided -family will obtain a tub bench for pt  Reasons for discharge: treatment goals met  Patient/family agrees with progress made and goals achieved: Yes  OT Discharge Precautions/Restrictions  Precautions Precautions: Fall Precaution Comments: mild L hemipareisis Restrictions Weight Bearing Restrictions Per Provider Order: No   Vital Signs Therapy Vitals Temp: 98.4 F (36.9 C) Pulse Rate: 71 Resp: 18 BP: 116/77 Patient Position (if appropriate): Lying Oxygen Therapy SpO2: 95 % O2 Device: Room Air Pain  No c/o pain  ADL ADL Eating: Independent Grooming: Independent Upper Body Bathing:  Independent Where Assessed-Upper Body Bathing: Shower Lower Body Bathing: Modified independent Where Assessed-Lower Body Bathing: Shower Upper Body Dressing: Independent Lower Body Dressing: Modified independent Toileting: Modified independent Where Assessed-Toileting: Teacher, Adult Education: Engineer, Agricultural Method: Proofreader: Engineer, Technical Sales: Modified independent Web Designer Method: Ship Broker: Emergency planning/management officer, Acupuncturist: Modified independent Film/video Editor Method: Designer, Industrial/product: Emergency planning/management officer, Grab bars Vision Baseline Vision/History: 1 Wears glasses Patient Visual Report: Blurring of vision (pt reports has improved from admission) Eye Alignment: Within Functional Limits Ocular Range of Motion: Within Functional Limits Alignment/Gaze Preference: Within Defined Limits Tracking/Visual Pursuits: Able to track stimulus in all quads without difficulty Visual Fields: No apparent deficits Additional Comments: pt's blurry vision has improved since admission Perception  Perception: Within Functional Limits Praxis Praxis: WFL Cognition Cognition Overall Cognitive Status: Within Functional Limits for tasks assessed Person: Oriented Place: Oriented Situation: Oriented Brief Interview for Mental Status (BIMS) Repetition of Three Words (First Attempt): 3 Temporal Orientation: Year: Correct Temporal Orientation: Month: Accurate within 5 days Temporal Orientation: Day: Correct Recall: Sock: Yes, no cue required Recall: Blue: Yes, no cue required Recall: Bed: Yes, no cue required BIMS Summary Score: 15 Sensation Sensation Light Touch: Appears Intact Hot/Cold: Appears Intact Proprioception: Appears Intact Stereognosis: Appears Intact Coordination Gross Motor Movements are Fluid and Coordinated: No Fine Motor Movements are  Fluid and Coordinated: Yes Coordination and Movement Description: slow and delayed on LLE side Finger Nose Finger Test: WFL Motor  Motor Motor: Hemiplegia Motor - Skilled Clinical Observations: mild LLE weakness Mobility    Mod ind with 4WW Trunk/Postural Assessment  Postural Control  Postural Control: Within Functional Limits  Balance Static Sitting Balance Static Sitting - Level of Assistance: 7: Independent Dynamic Sitting Balance Dynamic Sitting - Level of Assistance: 6: Modified independent (Device/Increase time) Static Standing Balance Static Standing - Level of Assistance: 7: Independent Dynamic Standing Balance Dynamic Standing - Level of Assistance: 6: Modified independent (Device/Increase time) Extremity/Trunk Assessment RUE Assessment General Strength Comments: 4+/5 LUE Assessment General Strength Comments: pt states she has had L hand weakness for years and has been unable to open jars or use L hand functionally except for writing.  Pt does feel her L side is weaker post CVA compared to normal weakness she has been dealing with for years.  Overall 4-/5 strength   Julaine Zimny 05/25/2023, 4:00 PM

## 2023-05-25 NOTE — Plan of Care (Signed)
  Problem: RH Expression Communication Goal: LTG Patient will increase word finding of common (SLP) Description: LTG:  Patient will increase word finding of common objects/daily info/abstract thoughts with cues using compensatory strategies (SLP). Outcome: Completed/Met Flowsheets (Taken 05/25/2023 1647) LTG: Patient will increase word finding of common (SLP): Supervision   Problem: RH Memory Goal: LTG Patient will use memory compensatory aids to (SLP) Description: LTG:  Patient will use memory compensatory aids to recall biographical/new, daily complex information with cues (SLP) Outcome: Completed/Met Flowsheets (Taken 05/25/2023 1647) LTG: Patient will use memory compensatory aids to (SLP): Supervision   Problem: RH Pre-functional/Other (Specify) Goal: RH LTG SLP (Specify) 1 Description: RH LTG SLP (Specify) 1 Outcome: Completed/Met Flowsheets (Taken 05/15/2023 1234 by Sockwell, Cassidi F, CCC-SLP) LTG: Other SLP (Specify) 1: Patient will recall and utilize education regarding speech intelligibility strategies during instances of dysarthria given minA

## 2023-05-25 NOTE — Progress Notes (Signed)
 Patient ID: SIRIAH TREAT, female   DOB: 18-Jul-1955, 68 y.o.   MRN: [id number]  SW faxed outpatient referral to [facility] for PT/OT/SLP/   Cecile Sheerer, MSW, LCSW Office: [phone] Cell: [phone] Fax: [phone]

## 2023-05-25 NOTE — Progress Notes (Signed)
 PROGRESS NOTE   Subjective/Complaints:  Labs reviewed  Mild anemia noted, stable  COnt to have trap pain which improves with counterirritant cream   ROS: as per HPI. Denies CP, SOB, abd pain, N/V/D/C, or any other complaints at this time.     Objective:   No results found.  Recent Labs    05/25/23 0501  WBC 7.5  HGB 11.0*  HCT 33.7*  PLT 254    Recent Labs    05/25/23 0501  NA 138  K 3.9  CL 107  CO2 23  GLUCOSE 75  BUN 13  CREATININE 0.91  CALCIUM  8.1*     Intake/Output Summary (Last 24 hours) at 05/25/2023 0752 Last data filed at 05/25/2023 0700 Gross per 24 hour  Intake 717 ml  Output --  Net 717 ml        Physical Exam: Vital Signs Blood pressure (!) 128/91, pulse 85, temperature 97.9 F (36.6 C), resp. rate 16, height 4' 11 (1.499 m), weight 85.5 kg, SpO2 97%.  General: No acute distress, resting in bed.  Mood and affect are appropriate, listening to her phone Heart: Regular rate and rhythm no rubs murmurs or extra sounds Lungs: Clear to auscultation, breathing unlabored, no rales or wheezes Abdomen: Positive bowel sounds, soft nontender to palpation, nondistended Extremities: No clubbing, cyanosis, or edema Skin: No evidence of breakdown, no evidence of rash over exposed surfaces  Neuro:  Alert and oriented x 3. Fair insight and awareness. Fair Memory. Normal language and speech. Cranial nerve exam unremarkable. MMT: 4/5 in bilateral Delt bi, tri, 3- HF 3- KE 4/5 B ADF and PF mild ataxia LUE>RUE and LLE>RLE. Musculoskeletal: Both traps tender along anterior border with palpation, left side > right  + impingement sign in Right shoulder    Assessment/Plan: 1. Functional deficits which require 3+ hours per day of interdisciplinary therapy in a comprehensive inpatient rehab setting. Physiatrist is providing close team supervision and 24 hour management of active medical problems listed  below. Physiatrist and rehab team continue to assess barriers to discharge/monitor patient progress toward functional and medical goals  Care Tool:  Bathing    Body parts bathed by patient: Left arm, Right arm, Chest, Abdomen, Front perineal area, Buttocks, Right upper leg, Face, Left upper leg, Right lower leg, Left lower leg   Body parts bathed by helper: Right lower leg, Left lower leg     Bathing assist Assist Level: Supervision/Verbal cueing     Upper Body Dressing/Undressing Upper body dressing   What is the patient wearing?: Pull over shirt, Bra    Upper body assist Assist Level: Set up assist    Lower Body Dressing/Undressing Lower body dressing      What is the patient wearing?: Underwear/pull up, Pants     Lower body assist Assist for lower body dressing: Supervision/Verbal cueing     Toileting Toileting    Toileting assist Assist for toileting: Independent with assistive device     Transfers Chair/bed transfer  Transfers assist     Chair/bed transfer assist level: Independent with assistive device     Locomotion Ambulation   Ambulation assist      Assist level: Supervision/Verbal  cueing Assistive device: Rollator Max distance: 200   Walk 10 feet activity   Assist     Assist level: Supervision/Verbal cueing Assistive device: Rollator   Walk 50 feet activity   Assist    Assist level: Supervision/Verbal cueing Assistive device: Rollator    Walk 150 feet activity   Assist Walk 150 feet activity did not occur: Safety/medical concerns  Assist level: Supervision/Verbal cueing Assistive device: Rollator    Walk 10 feet on uneven surface  activity   Assist Walk 10 feet on uneven surfaces activity did not occur: Safety/medical concerns   Assist level: Supervision/Verbal cueing Assistive device: Rollator   Wheelchair     Assist Is the patient using a wheelchair?: No Type of Wheelchair: Manual    Wheelchair assist  level: Dependent - Patient 0% Max wheelchair distance: 300 ft    Wheelchair 50 feet with 2 turns activity    Assist        Assist Level: Dependent - Patient 0%   Wheelchair 150 feet activity     Assist      Assist Level: Dependent - Patient 0%   Blood pressure (!) 128/91, pulse 85, temperature 97.9 F (36.6 C), resp. rate 16, height 4' 11 (1.499 m), weight 85.5 kg, SpO2 97%.  Medical Problem List and Plan: 1. 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  Antithrombotics: -DVT/anticoagulation:  Pharmaceutical: Lovenox  40mg  daily d/c amb distance >100' -antiplatelet therapy: Aspirin  81 mg daily and Plavix  75 mg day x 3 weeks (last day 1/16)  then aspirin  alone 3. Pain Management: Neurontin  300 mg 3 times daily Chronic pain syndrome on chronic opioid therapy at home  -baclofen  5 mg 3 times daily prn muscle spasms -Voltaren  4 g 4 times daily as needed--change to [employer] -oxycodone  5 mg every 6 hours as needed(currently taking 2-3 tabs/24h)- pt states home dose is 20mg  BID- will f/u with PCP Taking an average of ~3 tabs per day , will ned to check with pt whether she has oxyIR at home, if not will rx 1 week supply #21 tabs of OxyIR 5mg  -Change tylenol  from scheduled to prn  Xray Right shoulder -  R AC jt OA 4. Mood/Behavior/Sleep: Trazodone  100 mg nightly.  Provide emotional support             -antipsychotic agents: N/A 5. Neuropsych/cognition: This patient is not quite capable of making decisions on her own behalf. 6. Skin/Wound Care: Routine skin checks 7. Fluids/Electrolytes/Nutrition: Routine in and outs  -encourage liquids, po intake  -labs from 12/30 without significant abnl   8.  Hyponatremia. Improved.  9.  Hyperlipidemia.  Lipitor 40mg  daily 10.  Obesity.  History of gastric  bypass.  BMI 39.77.  Dietary follow-up 11.  GERD.  Protonix  40mg  daily 12.  History of iron  deficiency anemia.     -recent hgb 11.0 on 12/30--recheck Monday-- stable  -fe++ supp 13.  Permissive hypertension.  Patient on Benicar  HCT 40-25 mg tablet daily prior to admission.  Resume as needed  -bp soft despite being off all meds  -encourage fluids  1/13 BP stable, monitor Vitals:   05/21/23 1312 05/21/23 1944 05/22/23 0458 05/22/23 1316  BP: (!) 154/76 124/70 139/73 127/64   05/22/23 1925 05/23/23 0505  05/23/23 1347 05/23/23 1935  BP: 103/61 (!) 118/58 125/78 139/82   05/24/23 0405 05/24/23 1300 05/24/23 1950 05/25/23 0509  BP: 138/84 126/72 128/69 (!) 128/91     LOS: 11 days A FACE TO FACE EVALUATION WAS PERFORMED  Christine Reyes 05/25/2023, 7:52 AM

## 2023-05-25 NOTE — Progress Notes (Signed)
 Physical Therapy Discharge Summary  Patient Details  Name: Christine Reyes MRN: [id number] Date of Birth: Oct 15, 1955  Date of Discharge from PT service:May 25, 2023  Today's Date: 05/25/2023 PT Individual Time: 1303-1359 PT Individual Time Calculation (min): 56 min    Patient has met 8 of 8 long term goals due to improved activity tolerance, improved balance, improved postural control, increased strength, functional use of  left upper extremity and left lower extremity, improved awareness, and improved coordination.  Patient to discharge at an ambulatory level Modified Independent.   Patient's care partner is independent to provide the necessary physical and cognitive assistance at discharge.  Reasons goals not met: n/a  Recommendation:  Patient will benefit from ongoing skilled PT services in neuro outpatient setting to continue to advance safe functional mobility, address ongoing impairments in strength, coordination, balance, activity tolerance, cognition, safety awareness, and to minimize fall risk.  Equipment: No equipment provided  Reasons for discharge: treatment goals met and discharge from hospital  Patient/family agrees with progress made and goals achieved: Yes  PT Discharge Precautions/Restrictions Precautions Precautions: Fall Precaution Comments: mild L hemipareisis Restrictions Weight Bearing Restrictions Per Provider Order: No Vital Signs Therapy Vitals Temp: 98.4 F (36.9 C) Pulse Rate: 71 Resp: 18 BP: 116/77 Patient Position (if appropriate): Lying Oxygen Therapy SpO2: 95 % O2 Device: Room Air Pain Pain Assessment Pain Scale: 0-10 Pain Score: 5  Pain Type: Chronic pain Pain Location: Shoulder Pain Orientation: Right Pain Descriptors / Indicators: Aching;Discomfort Pain Onset: On-going Patients Stated Pain Goal: 0 Pain Intervention(s): Repositioned;Hot/Cold interventions Pain Interference Pain Interference Pain Effect on Sleep: 1. Rarely  or not at all Pain Interference with Therapy Activities: 2. Occasionally Pain Interference with Day-to-Day Activities: 2. Occasionally Vision/Perception  Vision - History Ability to See in Adequate Light: 1 Impaired Vision - Assessment Eye Alignment: Within Functional Limits Ocular Range of Motion: Within Functional Limits Alignment/Gaze Preference: Within Defined Limits Tracking/Visual Pursuits: Able to track stimulus in all quads without difficulty Additional Comments: pt's blurry vision has improved since admission Perception Perception: Within Functional Limits Praxis Praxis: WFL  Cognition Overall Cognitive Status: Impaired/Different from baseline Arousal/Alertness: Awake/alert Orientation Level: Oriented X4 Year: 2025 Month: January Day of Week: Correct Memory: Impaired Memory Impairment: Retrieval deficit;Storage deficit Awareness: Appears intact Problem Solving: Appears intact Executive Function: Organizing;Reasoning Reasoning: Appears intact Organizing: Appears intact Safety/Judgment: Appears intact Sensation Sensation Light Touch: Appears Intact Hot/Cold: Appears Intact Proprioception: Appears Intact Stereognosis: Appears Intact Coordination Gross Motor Movements are Fluid and Coordinated: No Fine Motor Movements are Fluid and Coordinated: Yes Coordination and Movement Description: slow and delayed on LLE side Finger Nose Finger Test: [facility] Motor  Motor Motor: Other (comment) (hemipareisis) Motor - Skilled Clinical Observations: mild LLE weakness Motor - Discharge Observations: mild LLE weakness  Mobility Bed Mobility Bed Mobility: Supine to Sit;Sit to Supine Supine to Sit: Independent with assistive device Sit to Supine: Independent Transfers Transfers: Stand Pivot Transfers;Sit to Stand;Stand to Sit Sit to Stand: Independent Stand to Sit: Independent Stand Pivot Transfers: Independent with assistive device Transfer (Assistive device):  Rollator Locomotion  Gait Ambulation: Yes Gait Assistance: Independent with assistive device;Supervision/Verbal cueing Gait Distance (Feet): 350 Feet Assistive device: Rollator Gait Gait: Yes Gait Pattern: Impaired Gait Pattern: Decreased hip/knee flexion - right;Decreased hip/knee flexion - left;Decreased stance time - left (improved from eval but still hesitant) Gait velocity: decreased Stairs / Additional Locomotion Stairs: Yes Stairs Assistance: Supervision/Verbal cueing Stair Management Technique: Two rails Number of Stairs: 12 Height of Stairs: 6 Ramp: Independent  with assistive device Curb: Supervision/Verbal cueing (RW or needs slightly more assist with rollator) Pick up small object from the floor assist level: Contact Guard/Touching assist;Supervision/Verbal cueing Wheelchair Mobility Wheelchair Mobility: No  Trunk/Postural Assessment  Cervical Assessment Cervical Assessment: Within Functional Limits Thoracic Assessment Thoracic Assessment: Exceptions to [facility] (rounded shoulders) Lumbar Assessment Lumbar Assessment: Exceptions to [facility] (increased lordosis with anterior pelvic tilt) Postural Control Postural Control: Within Functional Limits  Balance Balance Balance Assessed: Yes Standardized Balance Assessment Standardized Balance Assessment: Berg Balance Test Berg Balance Test Sit to Stand: Able to stand  independently using hands Standing Unsupported: Able to stand safely 2 minutes Sitting with Back Unsupported but Feet Supported on Floor or Stool: Able to sit safely and securely 2 minutes Stand to Sit: Controls descent by using hands Transfers: Able to transfer safely, minor use of hands Standing Unsupported with Eyes Closed: Able to stand 10 seconds safely Standing Ubsupported with Feet Together: Able to place feet together independently and stand for 1 minute with supervision From Standing, Reach Forward with Outstretched Arm: Can reach forward >12 cm safely  (5) (8) From Standing Position, Pick up Object from Floor: Able to pick up shoe, needs supervision From Standing Position, Turn to Look Behind Over each Shoulder: Looks behind one side only/other side shows less weight shift Turn 360 Degrees: Able to turn 360 degrees safely one side only in 4 seconds or less Standing Unsupported, Alternately Place Feet on Step/Stool: Able to complete 4 steps without aid or supervision Standing Unsupported, One Foot in Front: Able to plae foot ahead of the other independently and hold 30 seconds Standing on One Leg: Tries to lift leg/unable to hold 3 seconds but remains standing independently Total Score: 43 Static Sitting Balance Static Sitting - Balance Support: Feet supported Static Sitting - Level of Assistance: 7: Independent Dynamic Sitting Balance Dynamic Sitting - Balance Support: Feet supported Dynamic Sitting - Level of Assistance: 6: Modified independent (Device/Increase time);7: Independent Static Standing Balance Static Standing - Balance Support: No upper extremity supported Static Standing - Level of Assistance: 7: Independent Dynamic Standing Balance Dynamic Standing - Balance Support: Bilateral upper extremity supported;During functional activity Dynamic Standing - Level of Assistance: 6: Modified independent (Device/Increase time) Extremity Assessment  RUE Assessment General Strength Comments: 4+/5 LUE Assessment General Strength Comments: pt states she has had L hand weakness for years and has been unable to open jars or use L hand functionally except for writing.  Pt does feel her L side is weaker post CVA compared to normal weakness she has been dealing with for years.  Overall 4-/5 strength RLE Assessment RLE Assessment: Exceptions to [facility] RLE Strength RLE Overall Strength: Deficits Right Hip Flexion: 3+/5 Right Hip Extension: 4-/5 Right Hip ABduction: 4-/5 Right Hip ADduction: 4-/5 Right Knee Flexion: 3+/5 Right Knee  Extension: 4+/5 Right Ankle Dorsiflexion: 4/5 Right Ankle Plantar Flexion: 4/5 LLE Assessment LLE Assessment: Exceptions to WFL LLE Strength LLE Overall Strength: Deficits Left Hip Flexion: 3+/5 Left Hip Extension: 3+/5 Left Hip ABduction: 4-/5 Left Hip ADduction: 4-/5 Left Knee Flexion: 3+/5 Left Knee Extension: 4-/5 Left Ankle Dorsiflexion: 3+/5 Left Ankle Plantar Flexion: 4/5  Skilled Intervention: PT instructed pt in Grad day assessment to measure progress toward goals. See below for details. CARETool mobility assessment  also completed; see CAREtool tab in navigator for details.  Patient supine in bed on entrance to room. Related she had just fallen asleep. Patient alert and agreeable to PT session.   Patient with no pain complaint at  start of session.  Therapeutic Activity: Transfers: Pt performed sit<>stand and stand pivot transfers throughout session with Mod I. Car transfer performed with distant supervision.   Gait Training:  Pt ambulated >350 ft using rollator with Mod I. Demonstrated improving pace and quality of gait. Discussed potential use of walker vs rollator in the home/ out of home.  Neuromuscular Re-ed: NMR facilitated during session with focus on standing balance. Pt guided in Berg Balance test. Patient demonstrates increased fall risk as noted by score of  43/56 on Berg Balance Scale.  (<36= high risk for falls, close to 100%; 37-45 significant >80%; 46-51 moderate >50%; 52-55 lower >25%)  This is a significant improvement over pt's initial score of 23/ 56 from start of stay in IPR.   NMR performed for improvements in motor control and coordination, balance, sequencing, judgement, and self confidence/ efficacy in performing all aspects of mobility at highest level of independence.   Patient supine in bed at end of session with brakes locked, bed alarm set, and all needs within reach. Discussion with OT re: pt Mod I in room using RW or rollator per pt's  preference.    [name] PT, DPT, CSRS 05/25/2023, 6:19 PM

## 2023-05-25 NOTE — Plan of Care (Signed)
  Problem: RH Balance Goal: LTG Patient will maintain dynamic standing with ADLs (OT) Description: LTG:  Patient will maintain dynamic standing balance with assist during activities of daily living (OT)  Outcome: Completed/Met   Problem: Sit to Stand Goal: LTG:  Patient will perform sit to stand in prep for activites of daily living with assistance level (OT) Description: LTG:  Patient will perform sit to stand in prep for activites of daily living with assistance level (OT) Outcome: Completed/Met   Problem: RH Bathing Goal: LTG Patient will bathe all body parts with assist levels (OT) Description: LTG: Patient will bathe all body parts with assist levels (OT) Outcome: Completed/Met   Problem: RH Dressing Goal: LTG Patient will perform upper body dressing (OT) Description: LTG Patient will perform upper body dressing with assist, with/without cues (OT). Outcome: Completed/Met Goal: LTG Patient will perform lower body dressing w/assist (OT) Description: LTG: Patient will perform lower body dressing with assist, with/without cues in positioning using equipment (OT) Outcome: Completed/Met   Problem: RH Toileting Goal: LTG Patient will perform toileting task (3/3 steps) with assistance level (OT) Description: LTG: Patient will perform toileting task (3/3 steps) with assistance level (OT)  Outcome: Completed/Met   Problem: RH Toilet Transfers Goal: LTG Patient will perform toilet transfers w/assist (OT) Description: LTG: Patient will perform toilet transfers with assist, with/without cues using equipment (OT) Outcome: Completed/Met   Problem: RH Tub/Shower Transfers Goal: LTG Patient will perform tub/shower transfers w/assist (OT) Description: LTG: Patient will perform tub/shower transfers with assist, with/without cues using equipment (OT) Outcome: Completed/Met   Problem: RH Memory Goal: LTG Patient will demonstrate ability for day to day recall/carry over during activities of  daily living with assistance level (OT) Description: LTG:  Patient will demonstrate ability for day to day recall/carry over during activities of daily living with assistance level (OT). Outcome: Completed/Met

## 2023-05-26 NOTE — Plan of Care (Addendum)
  Problem: RH Balance Goal: LTG Patient will maintain dynamic standing balance (PT) Description: LTG:  Patient will maintain dynamic standing balance with assistance during mobility activities (PT) Outcome: Completed/Met Flowsheets (Taken 05/16/2023 0611) LTG: Pt will maintain dynamic standing balance during mobility activities with:: Independent with assistive device    Problem: RH Bed Mobility Goal: LTG Patient will perform bed mobility with assist (PT) Description: LTG: Patient will perform bed mobility with assistance, with/without cues (PT). Outcome: Completed/Met Flowsheets (Taken 05/16/2023 0611) LTG: Pt will perform bed mobility with assistance level of: Independent   Problem: RH Bed to Chair Transfers Goal: LTG Patient will perform bed/chair transfers w/assist (PT) Description: LTG: Patient will perform bed to chair transfers with assistance (PT). Outcome: Completed/Met Flowsheets (Taken 05/16/2023 0611) LTG: Pt will perform Bed to Chair Transfers with assistance level: Independent with assistive device    Problem: RH Car Transfers Goal: LTG Patient will perform car transfers with assist (PT) Description: LTG: Patient will perform car transfers with assistance (PT). Outcome: Completed/Met Flowsheets (Taken 05/16/2023 0611) LTG: Pt will perform car transfers with assist:: Supervision/Verbal cueing   Problem: RH Furniture Transfers Goal: LTG Patient will perform furniture transfers w/assist (OT/PT) Description: LTG: Patient will perform furniture transfers  with assistance (OT/PT). Outcome: Completed/Met Flowsheets (Taken 05/16/2023 0611) LTG: Pt will perform furniture transfers with assist:: Independent with assistive device    Problem: RH Ambulation Goal: LTG Patient will ambulate in controlled environment (PT) Description: LTG: Patient will ambulate in a controlled environment, # of feet with assistance (PT). Outcome: Completed/Met Flowsheets (Taken 05/16/2023 0611) LTG: Pt will  ambulate in controlled environ  assist needed:: Independent with assistive device LTG: Ambulation distance in controlled environment: at least 150 ft using LRAD Goal: LTG Patient will ambulate in home environment (PT) Description: LTG: Patient will ambulate in home environment, # of feet with assistance (PT). Outcome: Completed/Met Flowsheets (Taken 05/16/2023 0611) LTG: Pt will ambulate in home environ  assist needed:: Independent with assistive device LTG: Ambulation distance in home environment: at least 50 ft per bout using LRAD   Problem: RH Stairs Goal: LTG Patient will ambulate up and down stairs w/assist (PT) Description: LTG: Patient will ambulate up and down # of stairs with assistance (PT) Outcome: Completed/Met Flowsheets (Taken 05/16/2023 0611) LTG: Pt will ambulate up/down stairs assist needed:: Supervision/Verbal cueing LTG: Pt will  ambulate up and down number of stairs: at least 4 steps with HR setup as per home environment

## 2023-05-26 NOTE — Progress Notes (Signed)
 Inpatient Rehabilitation Care Coordinator Discharge Note   Patient Details  Name: Christine Reyes MRN: [id number] Date of Birth: May 14, 1955   Discharge location: D/c to home  Length of Stay: 11 days  Discharge activity level: Supervision  Home/community participation: Limited  Patient response un:Yzjouy Literacy - How often do you need to have someone help you when you read instructions, pamphlets, or other written material from your doctor or pharmacy?: Never  Patient response un:Dnrpjo Isolation - How often do you feel lonely or isolated from those around you?: Never  Services provided included: MD, RD, PT, OT, SLP, RN, TR, Pharmacy, Neuropsych, SW, CM  Financial Services:  Financial Services Utilized: Medicaid UHC Medicare (primary)  Choices offered to/list presented to: patient  Follow-up services arranged:  Outpatient    Outpatient Servicies: [facility] for PT/OT/SLP      Patient response to transportation need: Is the patient able to respond to transportation needs?: Yes In the past 12 months, has lack of transportation kept you from medical appointments or from getting medications?: No In the past 12 months, has lack of transportation kept you from meetings, work, or from getting things needed for daily living?: No   Patient/Family verbalized understanding of follow-up arrangements:  Yes  Individual responsible for coordination of the follow-up plan: contact pt or pt dtr Christine Reyes [phone]  Confirmed correct DME delivered: Christine Reyes Christine Reyes 05/26/2023    Comments (or additional information):fam edu completed  Summary of Stay    Date/Time Discharge Planning CSW  05/18/23 0808 Pt will d/c to home alone with intermittent check-ins throughout the day, ort only in the evening. Pt will need to be Mod I at discharge. SW will submit PCS referral if appropriate. SW will confirm there are no barriers to discharge. AAC       Christine Reyes Christine  Reyes

## 2023-05-28 ENCOUNTER — Encounter: Payer: Self-pay | Admitting: Oncology

## 2023-06-08 ENCOUNTER — Encounter: Payer: 59 | Admitting: Registered Nurse

## 2023-06-10 ENCOUNTER — Ambulatory Visit: Payer: 59

## 2023-06-10 ENCOUNTER — Ambulatory Visit: Payer: 59 | Admitting: Occupational Therapy

## 2023-06-17 ENCOUNTER — Ambulatory Visit: Payer: 59

## 2023-06-17 ENCOUNTER — Ambulatory Visit: Payer: 59 | Admitting: Physical Therapy

## 2023-06-18 ENCOUNTER — Encounter: Payer: 59 | Attending: Registered Nurse | Admitting: Registered Nurse

## 2023-06-18 ENCOUNTER — Encounter: Payer: Self-pay | Admitting: Registered Nurse

## 2023-06-18 ENCOUNTER — Telehealth: Payer: Self-pay | Admitting: Registered Nurse

## 2023-06-18 VITALS — BP 101/77 | HR 82 | Ht 59.0 in | Wt 184.6 lb

## 2023-06-18 DIAGNOSIS — I69398 Other sequelae of cerebral infarction: Secondary | ICD-10-CM | POA: Diagnosis present

## 2023-06-18 DIAGNOSIS — I10 Essential (primary) hypertension: Secondary | ICD-10-CM

## 2023-06-18 DIAGNOSIS — I951 Orthostatic hypotension: Secondary | ICD-10-CM

## 2023-06-18 DIAGNOSIS — H547 Unspecified visual loss: Secondary | ICD-10-CM

## 2023-06-18 DIAGNOSIS — I6350 Cerebral infarction due to unspecified occlusion or stenosis of unspecified cerebral artery: Secondary | ICD-10-CM | POA: Diagnosis present

## 2023-06-18 DIAGNOSIS — H538 Other visual disturbances: Secondary | ICD-10-CM

## 2023-06-18 DIAGNOSIS — E7849 Other hyperlipidemia: Secondary | ICD-10-CM | POA: Diagnosis present

## 2023-06-18 NOTE — Patient Instructions (Signed)
 Please Schedule an appointment with your Primary Care Physician

## 2023-06-18 NOTE — Telephone Encounter (Signed)
 Spoke with Dr Carilyn regarding [name] complain of blurred Vision and Vision field loss after CVA. We will continue to monitor at this time. She has a scheduled appointment with Neurology and she has a scheduled appointment with Dr Carilyn. We will re-evaluate. Call placed to [name] regarding the above, she verbalizes understanding.

## 2023-06-18 NOTE — Progress Notes (Addendum)
 Subjective:    Patient ID: Christine Reyes, female    DOB: 30-Aug-1955, 68 y.o.   MRN: [id number]  HPI: Christine Reyes is a 68 y.o. female who is here for HFU appointment of her Posterior circulation stroke, Essential hypertension, Hyperlipidemia and bilateral blurred vision. She presented to [facility] on 05/08/2023 after a fall, altered mental status, headache, generalized weakness and blurred vision.  Dr. Eldonna H&P: 05/08/2023 HPI: Christine Reyes is a 68 y.o. female with medical history significant of hypertension, obesity status post gastric bypass presenting with CVA.  Patient reports development of generalized weakness as well as confusion since yesterday.  Patient states she attempted to use the bathroom and does not fully remember what happened after that.  Patient states she had to call her mother to help her.  Has had persistent weakness and confusion since this morning.  No chest pain or shortness of breath.  No nausea or vomiting.  Patient denies any prior episodes like this in the past.  Non-smoker.  No alcohol use.  Baseline hypertension.  Unclear of blood pressure control.  Does report taking high-dose BC powders on a regular basis for joint pain.  No focal hemiparesis.  Positive generalized confusion. Presented to the ER afebrile, blood pressure 130s to 150s over 70s to 110s.  Satting well on room air.  White count 6.9, hemoglobin 11.8, platelets 402, lactate 0.4.  Troponin within normal limits.  Urinalysis stable.  Creatinine 0.85.  Sodium 125.  Found to be orthostatic positive in the ER.  CT head with acute to subacute cortical infarcts in the left occipital lobe.  CT Head: WO Contrast: CT Cervical Spine IMPRESSION: 1. 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 Recommend brain MRI for further evaluation. 2. No CT evidence of intracranial injury 3. No acute fracture or traumatic subluxation of the cervical spine.  CTA:  IMPRESSION: 1. No emergent large vessel  occlusion. 2. Minimal atherosclerotic changes at the carotid bifurcations and cavernous internal carotid arteries bilaterally without significant stenosis. 3. Mild tortuosity of the cervical internal carotid arteries bilaterally likely reflects chronic hypertension. 4. Mild degenerative changes of the cervical spine. 5.  Aortic Atherosclerosis (ICD10-I70.0).   MR Brain: WO Contrast:  MPRESSION: 1. Acute infarcts in the bilateral occipital lobes, left greater than right, which correlate with the hypodensity seen on the same-day CT. The bilateral occipital infarcts are associated with foci of hemosiderin deposition, which may represent thromboemboli. 2. Additional punctate acute infarcts in the right cerebellum and possible acute infarct in the right lateral pons.  Right Shoulder: X-ray MPRESSION: 1. Acute infarcts in the bilateral occipital lobes, left greater than right, which correlate with the hypodensity seen on the same-day CT. The bilateral occipital infarcts are associated with foci of hemosiderin deposition, which may represent thromboemboli. 2. Additional punctate acute infarcts in the right cerebellum and possible acute infarct in the right lateral pons.  Neurology Consulted: Recommended: aspirin  and Plavix  for CVA prophylaxis x 3 weeks then aspirin  alone.   Christine Reyes was admitted to inpatient rehabilitation on 05/14/2023 and discharged home on 05/26/2023. She states she has pain in her right shoulder and upper- lower back mainly right side. She rates her pain 7.  Christine Reyes arrived to office Hypotensive she states she took a baclofen  this morning and didn't take her Benicar . Orthostatic blood pressure were obtained. She was instructed to F/U with her PCP today, she verbalizes understanding.   [facility] was called, was able to  obtain PT/OT and Speech Evaluation appointments, she was instructed to keep her scheduled appointments. She was a No Show  for the last one she verbalizes understanding.    Pain Inventory Average Pain 7 Pain Right Now  sitting  has not pain but with movement can go up to 7 on stroke side My pain is constant, sharp, and throbbing  LOCATION OF PAIN  right side of body  BOWEL Number of stools per week: 7  BLADDER Normal  Mobility use a cane use a walker how many minutes can you walk? 15 ability to climb steps?  yes do you drive?  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  no  Physicians involved in your care Neurologist  Dr Lonza   Family History  Problem Relation Age of Onset   Hypertension Mother    Arthritis Mother    Cancer Father    Bipolar disorder Daughter    Breast cancer Neg Hx    Social History   Socioeconomic History   Marital status: Married    Spouse name: Not on file   Number of children: Not on file   Years of education: Not on file   Highest education level: Not on file  Occupational History   Not on file  Tobacco Use   Smoking status: Former    Current packs/day: 0.00    Types: Cigarettes    Quit date: 06/11/2015    Years since quitting: 8.0   Smokeless tobacco: Former  Building Services Engineer status: Never Used  Substance and Sexual Activity   Alcohol use: Yes    Alcohol/week: 2.0 standard drinks of alcohol    Types: 2 Glasses of wine per week   Drug use: No   Sexual activity: Not Currently    Birth control/protection: Surgical, Post-menopausal  Other Topics Concern   Not on file  Social History Narrative   Not on file   Social Drivers of Health   Financial Resource Strain: Low Risk  (01/07/2023)   Received from [facility]   Overall Financial Resource Strain (CARDIA)    Difficulty of Paying Living Expenses: Not hard  at all  Food Insecurity: No Food Insecurity (05/08/2023)   Hunger Vital Sign    Worried About Running Out of Food in the Last Year: Never true    Ran Out of Food in the Last Year: Never true  Transportation Needs: No Transportation Needs (05/08/2023)   PRAPARE - Administrator, Civil Service (Medical): No    Lack of Transportation (Non-Medical): No  Physical Activity: Not on file  Stress: Not on file  Social Connections: Patient Declined (05/13/2023)   Social Connection and Isolation Panel [NHANES]    Frequency of Communication with Friends and Family: Patient declined    Frequency of Social Gatherings with Friends and Family: Patient declined    Attends Religious Services: Patient declined    Database Administrator or Organizations: Patient declined    Attends Banker Meetings: Patient declined    Marital Status: Patient declined   Past Surgical History:  Procedure Laterality Date   ABDOMINAL HYSTERECTOMY     BREAST EXCISIONAL BIOPSY Right 1989   CHOLECYSTECTOMY     COLONOSCOPY WITH ESOPHAGOGASTRODUODENOSCOPY (EGD)     csection x2  DILATION AND CURETTAGE OF UTERUS     ESOPHAGOGASTRODUODENOSCOPY (EGD) WITH PROPOFOL  N/A 12/14/2015   Procedure: ESOPHAGOGASTRODUODENOSCOPY (EGD) WITH PROPOFOL ;  Surgeon: Lamar ONEIDA Holmes, MD;  Location: [facility] ENDOSCOPY;  Service: Endoscopy;  Laterality: N/A;   GASTRIC BYPASS     JOINT REPLACEMENT     left knee surgery     TOTAL KNEE ARTHROPLASTY Right 06/22/2017   Procedure: TOTAL KNEE ARTHROPLASTY;  Surgeon: Leora Lynwood SAUNDERS, MD;  Location: [facility] ORS;  Service: Orthopedics;  Laterality: Right;   Past Medical History:  Diagnosis Date   Anxiety    Arthritis    Headache    Hypertension    Iron  deficiency anemia 06/17/2018   Obesity    Sleep apnea    BP 90/64   Pulse 82   Ht 4' 11 (1.499 m)   Wt 184 lb 9.6 oz (83.7 kg)   SpO2 94%   BMI 37.28 kg/m   Opioid Risk Score:   Fall Risk Score:  `1  Depression screen [facility]  2/9     06/18/2023   11:20 AM  Depression screen PHQ 2/9  Decreased Interest 1  Down, Depressed, Hopeless 1  PHQ - 2 Score 2  Altered sleeping 3  Tired, decreased energy 3  Change in appetite 0  Feeling bad or failure about yourself  1  Trouble concentrating 0  Moving slowly or fidgety/restless 0  Suicidal thoughts 0  PHQ-9 Score 9     Review of Systems  Musculoskeletal:  Positive for gait problem.       Spasms  Neurological:  Positive for weakness, light-headedness and numbness.       Tingling  All other systems reviewed and are negative.      Objective:   Physical Exam Vitals and nursing note reviewed.  Constitutional:      Appearance: Normal appearance.  Cardiovascular:     Rate and Rhythm: Normal rate and regular rhythm.     Pulses: Normal pulses.     Heart sounds: Normal heart sounds.  Pulmonary:     Effort: Pulmonary effort is normal.     Breath sounds: Normal breath sounds.  Musculoskeletal:     Comments: Normal Muscle Bulk and Muscle Testing Reveals:  Upper Extremities: Full ROM and Muscle Strength 5/5 Right AC Joint Tenderness Thoracic and Lumbar Hypersensitivity  Lower Extremities: Full ROM and Muscle Strength 5/5 Arises from Table slowly using walker for support Narrow Based  Gait     Skin:    General: Skin is warm and dry.  Neurological:     Mental Status: She is alert and oriented to person, place, and time.  Psychiatric:        Mood and Affect: Mood normal.        Behavior: Behavior normal.         Assessment & Plan:  1.Posterior circulation stroke: She has a scheduled appointment with Neurology. She now has scheduled appointment with Outpatient Therapy at [facility]. Continue to Monitor.  ,2. Orthostic Hypotension: Blood Pressure was checked. She will call her PCP and schedule HFU appointment, she verbalizes understanding.  3. Essential hypertension: PCP Following. She was instructed to keep blood Pressure Log and will call her PCP today to  schedule HFU appointment, she verbalizes understanding.  4, Hyperlipidemia: Continue Current medication regimen. PCP following. Continue to Monitor.  5. Blurred vision/ Visual Field Loss: SP CVA .Will discuss with Dr Carilyn regarding Neuro Opthalmologist, and will give Christine Reyes a call, she verbalizes understanding.   F/U with  Dr Carilyn in 4- 6 weeks

## 2023-06-23 ENCOUNTER — Ambulatory Visit: Payer: 59 | Admitting: Physical Therapy

## 2023-06-23 ENCOUNTER — Encounter: Payer: Self-pay | Admitting: *Deleted

## 2023-06-23 ENCOUNTER — Encounter: Payer: 59 | Admitting: Occupational Therapy

## 2023-06-23 ENCOUNTER — Ambulatory Visit: Payer: 59

## 2023-06-24 ENCOUNTER — Ambulatory Visit: Payer: 59 | Admitting: Occupational Therapy

## 2023-06-24 ENCOUNTER — Ambulatory Visit: Payer: 59 | Attending: Physician Assistant | Admitting: Physical Therapy

## 2023-06-24 ENCOUNTER — Ambulatory Visit: Payer: 59 | Admitting: Speech Pathology

## 2023-06-25 ENCOUNTER — Ambulatory Visit: Payer: 59

## 2023-06-25 ENCOUNTER — Telehealth: Payer: Self-pay | Admitting: Speech Pathology

## 2023-06-25 NOTE — Telephone Encounter (Signed)
Pt was a no show/no call on 06/23/2022. This Clinical research associate attempted calling pt but reached her voicemail but it was full and I was not able to leave a message.  Will continue our efforts.   Glendel Jaggers B. Dreama Saa, M.S., CCC-SLP, Tree surgeon Certified Brain Injury Specialist [facility]  [facility] Services Office [phone] Ascom [phone] Fax [phone]

## 2023-06-29 ENCOUNTER — Ambulatory Visit: Payer: 59

## 2023-06-29 ENCOUNTER — Encounter: Payer: 59 | Admitting: Occupational Therapy

## 2023-06-30 ENCOUNTER — Encounter: Payer: Self-pay | Admitting: Neurology

## 2023-06-30 ENCOUNTER — Ambulatory Visit (INDEPENDENT_AMBULATORY_CARE_PROVIDER_SITE_OTHER): Payer: 59 | Admitting: Neurology

## 2023-06-30 VITALS — BP 129/75 | HR 67 | Ht 59.0 in | Wt 195.0 lb

## 2023-06-30 DIAGNOSIS — R269 Unspecified abnormalities of gait and mobility: Secondary | ICD-10-CM | POA: Diagnosis not present

## 2023-06-30 DIAGNOSIS — I63433 Cerebral infarction due to embolism of bilateral posterior cerebral arteries: Secondary | ICD-10-CM

## 2023-06-30 NOTE — Progress Notes (Signed)
Chief Complaint  Patient presents with   Room 15    Pt is here Alone. Pt is here for Stroke F/U from stroke on 05/08/2023. Pt states that she has been having memory loss. Pt states that she has been having blurry vision. Pt states that sometimes when she is reading something the word won't be there. Pt states that she has been having back pain that radiates down to her hip and leg since her stroke. Pt states that she can walk but due to her back pain it can make it difficult. Pt states that she can walk short distances, but if she has to walk long distan   cont.    She has to use the mobilized cart at the grocery store to move around.  Pt states that since her stroke she has been having problems with her knee. Pt would like to discuss how long it is going to be before she can drive.       ASSESSMENT AND PLAN  Christine Reyes is a 68 y.o. 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  Refer to cardiologist for loop recorder  Keep aspirin 81 mg, Lipitor,  Hypercoagulable panel  Return To Clinic With NP In 6 Months   DIAGNOSTIC DATA (LABS, IMAGING, TESTING) - I reviewed patient records, labs, notes, testing and imaging myself where available.   MEDICAL HISTORY:  Christine Reyes, is a 68 year old female seen in request by her primary care from [facility] Dr. Marcello Fennel, Roderic Palau, for evaluation of stroke, initial evaluation June 30, 2023  History is obtained from the patient and review of electronic medical records. I personally reviewed pertinent available imaging films in PACS.    PMHx of  HTN HLD Chronic insomnia History of DVT, was on Eliquis, was taken off in Dec 2024,  Chronic low back pain and knee pain. Obesity, Gastric Bypass in 2020 at [facility], lost 100 Lb initially, gained 25 Lb back, Iron Deficiency anemia, likely malabsorption due to gastric bypass  History of bilateral Knee replacement  She lives with her 6 years old mother, who suffered rheumatoid arthritis, prior to hospital admission in December 2024 for stroke, she already have some gait abnormality, rely on cane, or walker sometimes  She was admitted to the hospital on May 08, 2023, for generalized weakness, confusion  MRI brain on Dec 27th 2024. Acute infarcts in the bilateral occipital lobes, left greater than right, which correlate The bilateral occipital infarcts are associated with foci of hemosiderin deposition, which may represent thromboemboli. Additional punctate acute infarcts in the right cerebellum and possible acute infarct in the right lateral pons.  CT angiogram head and neck no large vessel disease, minimum atherosclerotic changes at carotid bifurcation and cavernous internal carotid artery bilaterally,  Echocardiogram with bubble study showed normal ejection fraction, no regional wall motion abnormality, no right-to-left shunt  She had a history of lower extremity DVT around the time that she was getting knee replacement, was treated with Eliquis for few years, that was just stopped by primary care in December 2024,  During hospital stay bilateral lower extremity venous  doppler study in Dec 2024 Showed no evidence of deep venous thrombosis in either lower extremity. Resolution of left posterior tibial vein DVT.   Laboratory evaluation, normal B12, vitamin D TSH free T4, iron panel, LDL 89, negative HIV, A1c 4.3,  She is back home after inpatient rehab, not back to her baseline, complains of blurry vision, gait abnormality, rely on her mid walker all the time, she was  treated with aspirin plus Plavix 75 mg for 1 months, now on aspirin alone,    She underwent 2 weeks of cardiac monitoring in December, no atrial fibrillation found  PHYSICAL EXAM:   Vitals:   06/30/23 1051  Weight: 195 lb (88.5 kg)  Height: 4\' 11"  (1.499 m)    Body mass index is 39.39 kg/m.  PHYSICAL EXAMNIATION:  Gen: NAD, conversant, well nourised, well groomed                     Cardiovascular: Regular rate rhythm, no peripheral edema, warm, nontender. Eyes: Conjunctivae clear without exudates or hemorrhage Neck: Supple, no carotid bruits. Pulmonary: Clear to auscultation bilaterally   NEUROLOGICAL EXAM:  MENTAL STATUS: Speech/cognition: Obese, depressed looking middle-age female, awake, alert, oriented to history taking and casual conversation CRANIAL NERVES: CN II: Visual fields are full to confrontation. Pupils are round equal and briskly reactive to light. CN III, IV, VI: extraocular movement are normal. No ptosis. CN V: Facial sensation is intact to light touch CN VII: Face is symmetric with normal eye closure  CN VIII: Hearing is normal to causal conversation. CN IX, X: Phonation is normal. CN XI: Head turning and shoulder shrug are intact  MOTOR: There is no pronator drift of out-stretched arms. Muscle bulk and tone are normal. Muscle strength is normal.  REFLEXES: Reflexes are 2+ and symmetric at the biceps, triceps, knees, and ankles. Plantar responses are flexor.  SENSORY: Intact to light touch, pinprick and vibratory sensation are intact in fingers and toes.  COORDINATION: There is no trunk or limb dysmetria noted.  GAIT/STANCE: Rely on her walker, unsteady,  REVIEW OF SYSTEMS:  Full 14 system review of systems performed and notable only for as above All other review of systems were negative.   ALLERGIES: Allergies  Allergen Reactions   Morphine And Codeine Itching   Tramadol Itching    HOME MEDICATIONS: Current Outpatient Medications   Medication Sig Dispense Refill   acetaminophen (TYLENOL) 325 MG tablet Take 2 tablets (650 mg total) by mouth every 6 (six) hours as needed for headache, fever, mild pain (pain score 1-3) or moderate pain (pain score 4-6).     aspirin EC 81 MG tablet Take 1 tablet (81 mg total) by mouth daily. Swallow whole.     atorvastatin (LIPITOR) 40 MG tablet Take 1 tablet (40 mg total) by mouth daily. 30 tablet 0   Baclofen 5 MG TABS Take 1 tablet (5 mg total) by mouth 3 (three) times daily as needed for muscle spasms. 30 tablet 0   folic acid (FOLVITE) 1 MG tablet Take 1 tablet (1 mg total) by mouth daily. 30 tablet 0   gabapentin (NEURONTIN) 300 MG capsule Take 1 capsule (300 mg total) by mouth 2 (two) times daily. 60 capsule 0   Iron-Vitamin C 65-125 MG TABS Take 1 tablet by mouth daily. 30 tablet 3   olmesartan-hydrochlorothiazide (BENICAR HCT) 40-25 MG tablet Take 1 tablet by mouth daily. 30 tablet 0   oxyCODONE (OXY IR/ROXICODONE) 5 MG immediate release tablet Take 1 tablet (  5 mg total) by mouth every 6 (six) hours as needed for moderate pain (pain score 4-6) or severe pain (pain score 7-10). (Patient taking differently: Take 20 mg by mouth every 6 (six) hours as needed for moderate pain (pain score 4-6) or severe pain (pain score 7-10).) 30 tablet 0   pantoprazole (PROTONIX) 40 MG tablet Take 1 tablet (40 mg total) by mouth daily. 30 tablet 0   traZODone (DESYREL) 100 MG tablet Take 1 tablet (100 mg total) by mouth at bedtime. 30 tablet 0   No current facility-administered medications for this visit.    PAST MEDICAL HISTORY: Past Medical History:  Diagnosis Date   Anxiety    Arthritis    Headache    Hypertension    Iron deficiency anemia 06/17/2018   Obesity    Sleep apnea     PAST SURGICAL HISTORY: Past Surgical History:  Procedure Laterality Date   ABDOMINAL HYSTERECTOMY     BREAST EXCISIONAL BIOPSY Right 1989   CHOLECYSTECTOMY     COLONOSCOPY WITH ESOPHAGOGASTRODUODENOSCOPY (EGD)      csection x2     DILATION AND CURETTAGE OF UTERUS     ESOPHAGOGASTRODUODENOSCOPY (EGD) WITH PROPOFOL N/A 12/14/2015   Procedure: ESOPHAGOGASTRODUODENOSCOPY (EGD) WITH PROPOFOL;  Surgeon: Scot Jun, MD;  Location: [facility] ENDOSCOPY;  Service: Endoscopy;  Laterality: N/A;   GASTRIC BYPASS     JOINT REPLACEMENT     left knee surgery     TOTAL KNEE ARTHROPLASTY Right 06/22/2017   Procedure: TOTAL KNEE ARTHROPLASTY;  Surgeon: Lyndle Herrlich, MD;  Location: [facility] ORS;  Service: Orthopedics;  Laterality: Right;    FAMILY HISTORY: Family History  Problem Relation Age of Onset   Hypertension Mother    Arthritis Mother    Cancer Father    Bipolar disorder Daughter    Breast cancer Neg Hx     SOCIAL HISTORY: Social History   Socioeconomic History   Marital status: Married    Spouse name: Not on file   Number of children: Not on file   Years of education: Not on file   Highest education level: Not on file  Occupational History   Not on file  Tobacco Use   Smoking status: Former    Current packs/day: 0.00    Types: Cigarettes    Quit date: 06/11/2015    Years since quitting: 8.0   Smokeless tobacco: Former  Building services engineer status: Never Used  Substance and Sexual Activity   Alcohol use: Yes    Alcohol/week: 2.0 standard drinks of alcohol    Types: 2 Glasses of wine per week   Drug use: No   Sexual activity: Not Currently    Birth control/protection: Surgical, Post-menopausal  Other Topics Concern   Not on file  Social History Narrative   Not on file   Social Drivers of Health   Financial Resource Strain: Low Risk  (01/07/2023)   Received from [facility]   Overall Financial Resource Strain (CARDIA)    Difficulty of Paying Living Expenses: Not hard at all  Food Insecurity: No Food Insecurity (05/08/2023)   Hunger Vital Sign    Worried About Running Out of Food in the Last Year: Never true    Ran Out of Food in the Last Year: Never true   Transportation Needs: No Transportation Needs (05/08/2023)   PRAPARE - Administrator, Civil Service (Medical): No    Lack of Transportation (Non-Medical): No  Physical Activity: Not  on file  Stress: Not on file  Social Connections: Patient Declined (05/13/2023)   Social Connection and Isolation Panel [NHANES]    Frequency of Communication with Friends and Family: Patient declined    Frequency of Social Gatherings with Friends and Family: Patient declined    Attends Religious Services: Patient declined    Database administrator or Organizations: Patient declined    Attends Banker Meetings: Patient declined    Marital Status: Patient declined  Intimate Partner Violence: Not At Risk (05/08/2023)   Humiliation, Afraid, Rape, and Kick questionnaire    Fear of Current or Ex-Partner: No    Emotionally Abused: No    Physically Abused: No    Sexually Abused: No      Levert Feinstein, M.D. Ph.D.  [facility] [address] [place], [place] [address] Ph: [phone] Fax: [phone]  CC:  Charlton Amor, PA-C [address] [place],  [place] [address]  Barbette Reichmann, MD

## 2023-07-01 ENCOUNTER — Encounter: Payer: 59 | Admitting: Occupational Therapy

## 2023-07-01 ENCOUNTER — Ambulatory Visit: Payer: 59

## 2023-07-01 ENCOUNTER — Ambulatory Visit: Payer: 59 | Admitting: Speech Pathology

## 2023-07-06 ENCOUNTER — Ambulatory Visit: Payer: 59 | Admitting: Physical Therapy

## 2023-07-06 ENCOUNTER — Encounter: Payer: 59 | Admitting: Occupational Therapy

## 2023-07-07 ENCOUNTER — Encounter: Payer: 59 | Admitting: Occupational Therapy

## 2023-07-07 ENCOUNTER — Ambulatory Visit: Payer: 59

## 2023-07-08 ENCOUNTER — Encounter: Payer: 59 | Admitting: Speech Pathology

## 2023-07-08 ENCOUNTER — Ambulatory Visit: Payer: 59

## 2023-07-09 ENCOUNTER — Ambulatory Visit: Payer: 59

## 2023-07-13 ENCOUNTER — Ambulatory Visit: Payer: 59

## 2023-07-13 ENCOUNTER — Encounter: Payer: 59 | Admitting: Occupational Therapy

## 2023-07-16 ENCOUNTER — Ambulatory Visit: Payer: 59

## 2023-07-16 ENCOUNTER — Encounter: Payer: 59 | Admitting: Speech Pathology

## 2023-07-16 ENCOUNTER — Ambulatory Visit: Payer: 59 | Admitting: Occupational Therapy

## 2023-07-16 ENCOUNTER — Encounter: Payer: 59 | Admitting: Occupational Therapy

## 2023-07-20 ENCOUNTER — Encounter: Payer: 59 | Admitting: Occupational Therapy

## 2023-07-20 ENCOUNTER — Ambulatory Visit: Payer: 59

## 2023-07-21 ENCOUNTER — Encounter: Payer: 59 | Admitting: Speech Pathology

## 2023-07-21 ENCOUNTER — Ambulatory Visit: Payer: 59 | Admitting: Physical Therapy

## 2023-07-23 ENCOUNTER — Encounter: Payer: 59 | Attending: Physical Medicine & Rehabilitation | Admitting: Physical Medicine & Rehabilitation

## 2023-07-23 ENCOUNTER — Ambulatory Visit: Payer: 59

## 2023-07-23 DIAGNOSIS — H538 Other visual disturbances: Secondary | ICD-10-CM | POA: Insufficient documentation

## 2023-07-23 DIAGNOSIS — I951 Orthostatic hypotension: Secondary | ICD-10-CM | POA: Insufficient documentation

## 2023-07-23 DIAGNOSIS — I69398 Other sequelae of cerebral infarction: Secondary | ICD-10-CM | POA: Insufficient documentation

## 2023-07-23 DIAGNOSIS — I10 Essential (primary) hypertension: Secondary | ICD-10-CM | POA: Insufficient documentation

## 2023-07-23 DIAGNOSIS — H547 Unspecified visual loss: Secondary | ICD-10-CM | POA: Insufficient documentation

## 2023-07-23 DIAGNOSIS — E7849 Other hyperlipidemia: Secondary | ICD-10-CM | POA: Insufficient documentation

## 2023-07-23 DIAGNOSIS — I6350 Cerebral infarction due to unspecified occlusion or stenosis of unspecified cerebral artery: Secondary | ICD-10-CM | POA: Insufficient documentation

## 2023-07-23 NOTE — Progress Notes (Deleted)
 Subjective:     Patient ID: Christine Reyes, female   DOB: 1955/10/28, 68 y.o.   MRN: [id number]  HPI  Pain Inventory Average Pain {NUMBERS; 0-10:5044} Pain Right Now {NUMBERS; 0-10:5044} My pain is {PAIN DESCRIPTION:21022940}  LOCATION OF PAIN  ***  BOWEL Number of stools per week: *** Oral laxative use {YES/NO:21197} Type of laxative *** Enema or suppository use {YES/NO:21197} History of colostomy {YES/NO:21197} Incontinent {YES/NO:21197}  BLADDER {bladder options:24190} In and out cath, frequency *** Able to self cath {YES/NO:21197} Bladder incontinence {YES/NO:21197} Frequent urination {YES/NO:21197} Leakage with coughing {YES/NO:21197} Difficulty starting stream {YES/NO:21197} Incomplete bladder emptying {YES/NO:21197}   Mobility {MOBILITY WUJ:81191478}  Function {FUNCTION:21022946}  Neuro/Psych {NEURO/PSYCH:21022948}  Prior Studies {CPRM PRIOR STUDIES:21022953}  Physicians involved in your care {CPRM PHYSICIANS INVOLVED IN YOUR CARE:21022954}   Family History  Problem Relation Age of Onset   Hypertension Mother    Arthritis Mother    Cancer Father    Bipolar disorder Daughter    Stroke Cousin    Stroke Niece    Breast cancer Neg Hx    Social History   Socioeconomic History   Marital status: Married    Spouse name: Not on file   Number of children: Not on file   Years of education: Not on file   Highest education level: Not on file  Occupational History   Not on file  Tobacco Use   Smoking status: Former    Current packs/day: 0.00    Types: Cigarettes    Quit date: 06/11/2015    Years since quitting: 8.1   Smokeless tobacco: Former  Building services engineer status: Never Used  Substance and Sexual Activity   Alcohol use: Yes    Alcohol/week: 2.0 standard drinks of alcohol    Types: 2 Glasses of wine per week   Drug use: No   Sexual activity: Not Currently    Birth control/protection: Surgical, Post-menopausal  Other Topics Concern    Not on file  Social History Narrative   Not on file   Social Drivers of Health   Financial Resource Strain: Low Risk  (01/07/2023)   Received from [facility]   Overall Financial Resource Strain (CARDIA)    Difficulty of Paying Living Expenses: Not hard at all  Food Insecurity: No Food Insecurity (05/08/2023)   Hunger Vital Sign    Worried About Running Out of Food in the Last Year: Never true    Ran Out of Food in the Last Year: Never true  Transportation Needs: No Transportation Needs (05/08/2023)   PRAPARE - Administrator, Civil Service (Medical): No    Lack of Transportation (Non-Medical): No  Physical Activity: Not on file  Stress: Not on file  Social Connections: Patient Declined (05/13/2023)   Social Connection and Isolation Panel [NHANES]    Frequency of Communication with Friends and Family: Patient declined    Frequency of Social Gatherings with Friends and Family: Patient declined    Attends Religious Services: Patient declined    Database administrator or Organizations: Patient declined    Attends Engineer, structural: Patient declined    Marital Status: Patient declined   Past Surgical History:  Procedure Laterality Date   ABDOMINAL HYSTERECTOMY     BREAST EXCISIONAL BIOPSY Right 1989   CHOLECYSTECTOMY     COLONOSCOPY WITH ESOPHAGOGASTRODUODENOSCOPY (EGD)     csection x2     DILATION AND CURETTAGE OF UTERUS     ESOPHAGOGASTRODUODENOSCOPY (EGD) WITH PROPOFOL  N/A 12/14/2015   Procedure: ESOPHAGOGASTRODUODENOSCOPY (EGD) WITH PROPOFOL;  Surgeon: Scot Jun, MD;  Location: [facility] ENDOSCOPY;  Service: Endoscopy;  Laterality: N/A;   GASTRIC BYPASS     JOINT REPLACEMENT     left knee surgery     TOTAL KNEE ARTHROPLASTY Right 06/22/2017   Procedure: TOTAL KNEE ARTHROPLASTY;  Surgeon: Lyndle Herrlich, MD;  Location: [facility] ORS;  Service: Orthopedics;  Laterality: Right;   Past Medical History:  Diagnosis Date   Anxiety    Arthritis     Headache    Hypertension    Iron deficiency anemia 06/17/2018   Obesity    Sleep apnea    There were no vitals taken for this visit.  Opioid Risk Score:   Fall Risk Score:  `1  Depression screen [facility] 2/9     06/18/2023   11:20 AM  Depression screen PHQ 2/9  Decreased Interest 1  Down, Depressed, Hopeless 1  PHQ - 2 Score 2  Altered sleeping 3  Tired, decreased energy 3  Change in appetite 0  Feeling bad or failure about yourself  1  Trouble concentrating 0  Moving slowly or fidgety/restless 0  Suicidal thoughts 0  PHQ-9 Score 9    Review of Systems     Objective:   Physical Exam     Assessment:     ***    Plan:     ***

## 2023-07-27 ENCOUNTER — Encounter: Payer: 59 | Admitting: Occupational Therapy

## 2023-07-27 ENCOUNTER — Ambulatory Visit: Payer: 59

## 2023-07-28 ENCOUNTER — Encounter: Payer: Self-pay | Admitting: Physical Therapy

## 2023-07-28 ENCOUNTER — Ambulatory Visit: Admitting: Physical Therapy

## 2023-07-28 ENCOUNTER — Other Ambulatory Visit: Payer: Self-pay

## 2023-07-28 ENCOUNTER — Ambulatory Visit: Admitting: Speech Pathology

## 2023-07-28 ENCOUNTER — Ambulatory Visit: Attending: Physician Assistant | Admitting: Occupational Therapy

## 2023-07-28 DIAGNOSIS — M6281 Muscle weakness (generalized): Secondary | ICD-10-CM

## 2023-07-28 DIAGNOSIS — R278 Other lack of coordination: Secondary | ICD-10-CM | POA: Diagnosis present

## 2023-07-28 DIAGNOSIS — R41841 Cognitive communication deficit: Secondary | ICD-10-CM

## 2023-07-28 DIAGNOSIS — M5459 Other low back pain: Secondary | ICD-10-CM | POA: Insufficient documentation

## 2023-07-28 DIAGNOSIS — I6350 Cerebral infarction due to unspecified occlusion or stenosis of unspecified cerebral artery: Secondary | ICD-10-CM | POA: Diagnosis not present

## 2023-07-28 NOTE — Therapy (Unsigned)
 OUTPATIENT PHYSICAL THERAPY NEURO EVALUATION   Patient Name: Christine Reyes MRN: [id number] DOB:1955/10/29, 68 y.o., female Today's Date: 07/28/2023   PCP:   Barbette Reichmann, MD   REFERRING PROVIDER:    Charlton Amor, PA-C     END OF SESSION:  PT End of Session - 07/28/23 1013     Visit Number 1    Number of Visits 24    Date for PT Re-Evaluation 10/20/23    PT Start Time 1145    PT Stop Time 1225    PT Time Calculation (min) 40 min    Equipment Utilized During Treatment Gait belt    Behavior During Therapy WFL for tasks assessed/performed             Past Medical History:  Diagnosis Date   Anxiety    Arthritis    Headache    Hypertension    Iron deficiency anemia 06/17/2018   Obesity    Sleep apnea    Past Surgical History:  Procedure Laterality Date   ABDOMINAL HYSTERECTOMY     BREAST EXCISIONAL BIOPSY Right 1989   CHOLECYSTECTOMY     COLONOSCOPY WITH ESOPHAGOGASTRODUODENOSCOPY (EGD)     csection x2     DILATION AND CURETTAGE OF UTERUS     ESOPHAGOGASTRODUODENOSCOPY (EGD) WITH PROPOFOL N/A 12/14/2015   Procedure: ESOPHAGOGASTRODUODENOSCOPY (EGD) WITH PROPOFOL;  Surgeon: Scot Jun, MD;  Location[facility] END[facility]COPY;  Service: Endoscopy;  Laterality: N/A;   GASTRIC BYPASS     JOINT REPLACEMENT     left knee surgery     TOTAL KNEE ARTHROPLASTY Right 06/22/2017   Procedure: TOTAL KNEE ARTHROPLASTY;  Surgeon: Lyndle Herrlich, MD;  Location: [facility] ORS;  Service: Orthopedics;  Laterality: Right;   Patient Active Problem List   Diagnosis Date Noted   Gait abnormality 06/30/2023   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   CVA (cerebral vascular accident) (HCC) 05/08/2023   Hyponatremia 05/08/2023   Evaluation by psychiatric service required 02/08/2019   Spinal stenosis, lumbar region, with neurogenic claudication 01/18/2019   Lumbar facet arthropathy 01/18/2019   Lumbar spondylosis 01/18/2019   Lumbar degenerative disc disease 01/18/2019    Primary osteoarthritis of left knee 01/18/2019   Chronic pain syndrome 01/18/2019   Acute diastolic congestive heart failure (HCC) 01/18/2019   Iron deficiency anemia 06/17/2018   Status post bariatric surgery 03/31/2018   BMI 33.0-33.9,adult 03/31/2018   History of total knee arthroplasty 07/15/2017   History of total right knee replacement 06/22/2017   Gastroesophageal reflux disease without esophagitis 03/20/2016   Functional diarrhea 03/20/2016   Pleural effusion, right 03/19/2016   Morbid obesity (HCC) 03/06/2016   Hypertensive disorder 07/24/2015   Obstructive sleep apnea on CPAP 07/24/2015   Gallstone 07/24/2015   Primary osteoarthritis of both knees 07/04/2015   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   Arthritis, senescent 09/30/2013   Low back pain 09/30/2013    ONSET DATE: 05/08/2023   REFERRING DIAG:  Diagnosis  I63.50 (ICD-10-CM) - Posterior circulation stroke (HCC)    THERAPY DIAG:  No diagnosis found.  Rationale for Evaluation and Treatment: Rehabilitation  SUBJECTIVE:  SUBJECTIVE STATEMENT: Reports CVA in December of 2024. Was hospitalized and then received inpatient rehab services for 11days. States that she has had increased pain/stiffness in Back, hips and knees. Since hospitalization. Reports that Prior to CVA, she experienced mild low back pain, but now her entire back has been bothering her. Continues to report weakness in the R hand and balance deficits continue since d/c from CIR. No falls   Pt accompanied by: self  PERTINENT HISTORY:  From recent Hospital admission: "68 y.o. right-handed female with history significant for anxiety hypertension, iron deficiency anemia, obesity status post gastric bypass with BMI 39.77, quit smoking 7 years ago, right TKA 2019 complicated  by DVT.  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  1 level home 3 steps to entry.  Independent prior to admission.  There is a granddaughter and a daughter in the area with good support.  [facility]ed to [facility]/20[facility] after reported fall with altered mental status headache generalized weakness as well as blurred vision.  Cranial CT scan showed acute to subacute cortical in the left occipital lobe.  CT cervical spine negative.  CTA of head and neck no emergent large vessel occlusion.  MRI showed acute infarct in the bilateral occipital lobes left greater than right.  The bilateral occipital infarct with foci of hemosiderin deposition possibly representing thromboemboli.  Additional punctate acute infarct in the right cerebellum and possible acute infarct in the right lateral pons.  Patient did not receive tPA.  Bilateral lower extremity ultrasound showed no evidence of DVT in either lower extremity and resolution of left posterior tibial vein DVT.  Admission chemistries unremarkable except sodium 125 osmolality 273 urine osmolality 686 TSH 4.077.  Echocardiogram with ejection fraction of 55 to 60% no wall motion abnormalities grade 1 diastolic dysfunction.  Neurology follow-up placed on aspirin and Plavix for CVA prophylaxis x 3 weeks then aspirin alone.  Lovenox for DVT prophylaxis.  Hyponatremia resolved after initial fluid restriction latest sodium 141.  Tolerating a regular consistency diet.  Therapy evaluations completed due to patient decreased functional mobility was admitted for a comprehensive rehab program     Hospital Course: JERMIAH HOWTON was admitted to rehab 05/14/2023 for inpatient therapies to consist of PT, ST and OT at least three hours five days a week. Past admission physiatrist, therapy team and rehab RN have worked together to provide customized collaborative inpatient rehab.  Pertaining to patient's multifocal posterior circulation infarct remained stable.   She will continue on low-dose aspirin and Plavix x 3 weeks through 05/28/2023 then aspirin alone and follow-up neurology services.  Lovenox for DVT prophylaxis.  Pain management use of Neurontin titrated as needed using baclofen as needed for muscle spasms oxycodone for breakthrough pain.  She was having some difficulty in sleeping with assistance of trazodone added with good results.  Lipitor ongoing for hyperlipidemia.  History of gastric bypass BMI 39.77 dietary follow-up.  Iron deficiency anemia monitoring of hemoglobin hematocrit follow-up outpatient.  "  PAIN:  Are you having pain? Yes: NPRS scale: 4/10  Pain location: back  Pain description: hurts pulling  Aggravating factors: walking  Relieving factors: stretching   PRECAUTIONS: Fall  RED FLAGS: None   WEIGHT BEARING RESTRICTIONS: No  FALLS: Has patient fallen in last 6 months? Yes. Number of falls 1  LIVING ENVIRONMENT: Lives with: lives with their family and lives alone Lives in: House/apartment Stairs: Yes: External: 3 steps; on right going up, on left going up,  and can reach both Has following equipment at home: Single point cane and Walker - 2 wheeled uses [facility], mostly Mother will go to adult day care center for 7-8 hours per day. Nursing assistance for care on Sundays from 1 hour   PLOF: Independent and Independent with basic ADLs  PATIENT GOALS: reduce back pain, improve balance,   OBJECTIVE:  Note: Objective measures were completed at Evaluation unless otherwise noted.  DIAGNOSTIC FINDINGS:   Shoulder DG IMPRESSION: Mild AC joint degenerative change.   MRI IMPRESSION: 1. Acute infarcts in the bilateral occipital lobes, left greater than right, which correlate with the hypodensity seen on the same-day CT. The bilateral occipital infarcts are associated with foci of hemosiderin deposition, which may represent thromboemboli. 2. Additional punctate acute infarcts in the right cerebellum and possible acute  infarct in the right lateral pons.  COGNITION: Overall cognitive status: Impaired mild memory deficits.    SENSATION: Light touch: Impaired  and baseline neuropathy in bil finger tips and toes   COORDINATION: Decreased ROM due to strength deficits on the LLE, coordination grossly WFL   EDEMA:  {edema:24020}  MUSCLE TONE: {LE tone:25568}  MUSCLE LENGTH: Hamstrings: Right *** deg; Left *** deg Maisie Fus test: Right *** deg; Left *** deg  DTRs:  {DTR SITE:24025}  POSTURE: rounded shoulders, forward head, decreased lumbar lordosis, and increased thoracic kyphosis  LOWER EXTREMITY ROM:     Active  Right Eval Left Eval  Hip flexion Limited by habitus  Limited by habitus   Hip extension    Hip abduction    Hip adduction    Hip internal rotation    Hip external rotation    Knee flexion    Knee extension    Ankle dorsiflexion    Ankle plantarflexion    Ankle inversion    Ankle eversion     (Blank rows = not tested)  LOWER EXTREMITY MMT:    MMT Right Eval Left Eval  Hip flexion 4 4-  Hip extension 4- 4-  Hip abduction 4- 4  Hip adduction 4- 4  Hip internal rotation    Hip external rotation    Knee flexion 4- 4  Knee extension 4- 4  Ankle dorsiflexion 5 5  Ankle plantarflexion    Ankle inversion    Ankle eversion    (Blank rows = not tested)  Thoracic extension  10   Thoracic flexion  30   Trunk rotation R  45  4  Trunk rotation L  50  4  Ankle dorsiflexion 5 5  Ankle plantarflexion    Ankle inversion    Ankle eversion     BED MOBILITY:  {Bed mobility:24027}  TRANSFERS: Assistive device utilized: {Assistive devices:23999}  Sit to stand: {Levels of assistance:24026} Stand to sit: {Levels of assistance:24026} Chair to chair: {Levels of assistance:24026} Floor: {Levels of assistance:24026}  RAMP:  Level of Assistance: {Levels of assistance:24026} Assistive device utilized: {Assistive devices:23999} Ramp Comments: ***  CURB:  Level of Assistance:  {Levels of assistance:24026} Assistive device utilized: {Assistive devices:23999} Curb Comments: ***  STAIRS: Level of Assistance: {Levels of assistance:24026} Stair Negotiation Technique: {Stair Technique:27161} with {Rail Assistance:27162} Number of Stairs: ***  Height of Stairs: ***  Comments: ***  GAIT: Gait pattern: {gait characteristics:25376} Distance walked: *** Assistive device utilized: {Assistive devices:23999} Level of assistance: {Levels of assistance:24026} Comments: ***  FUNCTIONAL TESTS:  5 times sit to stand: 14.41sec  Timed up and go (TUG): 11.96sec  6 minute walk test: 962ft without AD  10 meter  walk test: 0.5m/s  Berg Balance Scale: 43 Dynamic Gait Index: to be complete.   PATIENT SURVEYS:  ABC scale to be completed                                                                                                                               TREATMENT DATE: ***    PATIENT EDUCATION: Education details: POC  Person educated: {Person educated:25204} Education method: {Education Method:25205} Education comprehension: {Education Comprehension:25206}  HOME EXERCISE PROGRAM: ***  GOALS: Goals reviewed with patient? {yes/no:20286}  SHORT TERM GOALS: Target date: ***  *** Baseline: Goal status: INITIAL  2.  *** Baseline:  Goal status: INITIAL  3.  *** Baseline:  Goal status: INITIAL  4.  *** Baseline:  Goal status: INITIAL  5.  *** Baseline:  Goal status: INITIAL  6.  *** Baseline:  Goal status: INITIAL  LONG TERM GOALS: Target date: ***  *** Baseline:  Goal status: INITIAL  2.  *** Baseline:  Goal status: INITIAL  3.  *** Baseline:  Goal status: INITIAL  4.  *** Baseline:  Goal status: INITIAL  5.  *** Baseline:  Goal status: INITIAL  6.  *** Baseline:  Goal status: INITIAL  ASSESSMENT:  CLINICAL IMPRESSION: Patient is a *** y.o. *** who was seen today for physical therapy evaluation and treatment for ***.    OBJECTIVE IMPAIRMENTS: {opptimpairments:25111}.   ACTIVITY LIMITATIONS: {activitylimitations:27494}  PARTICIPATION LIMITATIONS: {participationrestrictions:25113}  PERSONAL FACTORS: {Personal factors:25162} are also affecting patient's functional outcome.   REHAB POTENTIAL: {rehabpotential:25112}  CLINICAL DECISION MAKING: {clinical decision making:25114}  EVALUATION COMPLEXITY: {Evaluation complexity:25115}  PLAN:  PT FREQUENCY: {rehab frequency:25116}  PT DURATION: {rehab duration:25117}  PLANNED INTERVENTIONS: {rehab planned interventions:25118::"97110-Therapeutic exercises","97530- Therapeutic [phone]- Neuromuscular re-education","97535- Self GNFA","21308- Manual therapy"}  PLAN FOR NEXT SESSION: ***   Golden Pop, PT 07/28/2023, 10:14 AM

## 2023-07-28 NOTE — Therapy (Addendum)
 OUTPATIENT OCCUPATIONAL THERAPY NEURO EVALUATION  Patient Name: Christine Reyes MRN: [id number] DOB:01-Mar-1956, 68 y.o., female Today's Date: 07/28/2023  PCP: Barbette Reichmann, MD REFERRING PROVIDER: Charlton Amor  END OF SESSION:  OT End of Session - 07/28/23 1151     Visit Number 1    Number of Visits 24    Date for OT Re-Evaluation 10/20/23    OT Start Time 1018    OT Stop Time 1100    OT Time Calculation (min) 42 min    Activity Tolerance Patient tolerated treatment well    Behavior During Therapy [facility] for tasks assessed/performed             Past Medical History:  Diagnosis Date   Anxiety    Arthritis    Headache    Hypertension    Iron deficiency anemia 06/17/2018   Obesity    Sleep apnea    Past Surgical History:  Procedure Laterality Date   ABDOMINAL HYSTERECTOMY     BREAST EXCISIONAL BIOPSY Right 1989   CHOLECYSTECTOMY     COLONOSCOPY WITH ESOPHAGOGASTRODUODENOSCOPY (EGD)     csection x2     DILATION AND CURETTAGE OF UTERUS     ESOPHAGOGASTRODUODENOSCOPY (EGD) WITH PROPOFOL N/A 12/14/2015   Procedure: ESOPHAGOGASTRODUODENOSCOPY (EGD) WITH PROPOFOL;  Surgeon: Scot Jun, MD;  Location: [facility] ENDOSCOPY;  Service: Endoscopy;  Laterality: N/A;   GASTRIC BYPASS     JOINT REPLACEMENT     left knee surgery     TOTAL KNEE ARTHROPLASTY Right 06/22/2017   Procedure: TOTAL KNEE ARTHROPLASTY;  Surgeon: Lyndle Herrlich, MD;  Location: [facility] ORS;  Service: Orthopedics;  Laterality: Right;   Patient Active Problem List   Diagnosis Date Noted   Gait abnormality 06/30/2023   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   CVA (cerebral vascular accident) (HCC) 05/08/2023   Hyponatremia 05/08/2023   Evaluation by psychiatric service required 02/08/2019   Spinal stenosis, lumbar region, with neurogenic claudication 01/18/2019   Lumbar facet arthropathy 01/18/2019   Lumbar spondylosis 01/18/2019   Lumbar degenerative disc disease 01/18/2019   Primary  osteoarthritis of left knee 01/18/2019   Chronic pain syndrome 01/18/2019   Acute diastolic congestive heart failure (HCC) 01/18/2019   Iron deficiency anemia 06/17/2018   Status post bariatric surgery 03/31/2018   BMI 33.0-33.9,adult 03/31/2018   History of total knee arthroplasty 07/15/2017   History of total right knee replacement 06/22/2017   Gastroesophageal reflux disease without esophagitis 03/20/2016   Functional diarrhea 03/20/2016   Pleural effusion, right 03/19/2016   Morbid obesity (HCC) 03/06/2016   Hypertensive disorder 07/24/2015   Obstructive sleep apnea on CPAP 07/24/2015   Gallstone 07/24/2015   Primary osteoarthritis of both knees 07/04/2015   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   Arthritis, senescent 09/30/2013   Low back pain 09/30/2013    ONSET DATE: 05/08/2023  REFERRING DIAG: CVA  THERAPY DIAG:  Muscle weakness (generalized)  Other lack of coordination  Rationale for Evaluation and Treatment: Rehabilitation  SUBJECTIVE:   SUBJECTIVE STATEMENT: Pt. Reports that she forgot her cane this morning. Pt accompanied by: self  PERTINENT HISTORY: Pt. Is a 68 y.o. female who was hospitalized with an acute embolic posterior circulation CVA. MRI imaging revealed: multiple acute infarcts in the bilateral occipital lobes punctate, right cerebellum, and possible right lateral pons. PMHx includes: DVT, Hyperlipidemia, Obesity, GERD, Iron deficiency Anemia, Permissive HTN  PRECAUTIONS: None  WEIGHT BEARING RESTRICTIONS: No  PAIN:  Are you having pain? 7/10 after walking the long distance from the  front entrance in bilateral knees; 8/10 right shoulder  FALLS: Has patient fallen in last 6 months? 1 fall when the CVA occurred  LIVING ENVIRONMENT: Lives with: Resides with her mother; Caregiver  Lives in: [place]; one floor Stairs:  Ramp on the back Has following equipment at home: Single point cane, Shower bench, bed side commode, and Grab bars  PLOF:  Independent  Retired Probation officer  PATIENT GOALS: To get stronger, and return to PLOF, improve eyesight  OBJECTIVE:  Note: Objective measures were completed at Evaluation unless otherwise noted.  HAND DOMINANCE: Left; only writes with the left  ADLs:  Transfers/ambulation related to ADLs: Independent Eating: Independent, cutting food. Difficulty opening bottles Grooming: Independent UB Dressing: Independent LB Dressing: Independent Toileting: Independent Bathing: Independent Tub Shower transfers:  Independent   IADLs: Shopping: grandson assists Light housekeeping: increased time required Meal Prep: Independent Community mobility: Has resumed driving  short distance in the day  Medication management: Independent Financial management: No changes noted Handwriting: 90% legible-Cursive name. Pt. Reports is at normal level Hobbies: listening music, being with great grand kids, looking at TV, sitting on the porch  MOBILITY STATUS: Independent Pt. Reports that she uses a cane.  And that this morning she walked from the front entrance to therapy without a cane.  POSTURE COMMENTS:   Sitting balance: intact  ACTIVITY TOLERANCE:  Activity tolerance: Fair  FUNCTIONAL OUTCOME MEASURES: TBA  UPPER EXTREMITY ROM:    Active ROM Right eval Left eval  Shoulder flexion 100(115) 960(454)  Shoulder abduction 70(96) 122(138)  Shoulder adduction    Shoulder extension    Shoulder internal rotation    Shoulder external rotation    Elbow flexion WNL WNL  Elbow extension WNL WNL  Wrist flexion    Wrist extension WNL WNL  Wrist ulnar deviation    Wrist radial deviation    Wrist pronation    Wrist supination    (Blank rows = not tested)  UPPER EXTREMITY MMT:     MMT Right eval Left eval  Shoulder flexion 3-/5 3-/5  Shoulder abduction 3-/5 3-/5  Shoulder adduction    Shoulder extension    Shoulder internal rotation    Shoulder external rotation    Middle trapezius     Lower trapezius    Elbow flexion 4-/5 4-/5  Elbow extension 4-/5 4-/5  Wrist flexion    Wrist extension 4-/5 4-/5  Wrist ulnar deviation    Wrist radial deviation    Wrist pronation    Wrist supination    (Blank rows = not tested)  HAND FUNCTION: Grip strength: Right: 6 lbs; Left: 16 lbs, Lateral pinch: Right: 8 lbs, Left: 12 lbs, and 3 point pinch: Right: 5 lbs, Left: 10 lbs  COORDINATION: 9 Hole Peg test: Right: 26 sec; Left: 22 sec  SENSATION: Light touch: WFL Proprioception: WFL  EDEMA: WFL  COGNITION: Overall cognitive status: Within functional limits for tasks assessed; reports changes with memory  VISION: Subjective report: Pt. reports difficulty with reading, blurriness Has an eye appointment scheduled in April with [facility]  VISION ASSESSMENT:  TBD  PERCEPTION: Not tested  PRAXIS: [facility]  for tasks performed  TREATMENT DATE: 07/28/23  Initial evaluation completed. Pt. Education was provided as indicated below   PATIENT EDUCATION: Education details: OT services, POC, goals, UE & ADL/IADL functioning, visual compensatory strategies Person educated: Patient Education method: Explanation Education comprehension: verbalized understanding, returned demonstration, verbal cues required, and needs further education  HOME EXERCISE PROGRAM:  Continue to assess ongoing need for, and provide as indicated.   GOALS: Goals reviewed with patient? Yes  SHORT TERM GOALS: Target date: 09/08/2023  Pt. Will be independent with HEPs for the RUE. Baseline: Eval: No current HEP Goal status: INITIAL  LONG TERM GOALS: Target date: 10/20/2023  Pt. Will improve bilateral shoulder ROM by 10 degrees to assist with ADL/IADLs Baseline: Eval: Shoulder flexion: R: 100(115), L: 161(096); abduction: R: 70(96), L: 122(138) Goal status: INITIAL  2.  Pt.  Will improve bilateral UE strength by 2 mm grades to assist with ADLs, and IADLs  Baseline: Eval: Shoulder flexion: R: 3-/5 L: 3-/5, abduction: R: 3-/5, L: 3-/5, elbow flexion: R: 4-/5, L: 4-/5, extension: R: 4-/5, L: 4-/5, wrist extension: R: 4-/5  L: 4-/5 Goal status: INITIAL  3.  Pt. Will improve right grip strength by 5# to be able to securely hold items. Baseline: Eval: R: 6#, L: 16#  Goal status: INITIAL  4.  Pt. Will improve right pinch strength by 3# to be able to efficiently open bottles, and containers Baseline: Lateral pinch: R: 8# L: 12#, 3 pt. pinch: R: 5#, L: 10# Goal status: INITIAL  5.  Pt. Will improve right hand [facility] skills by 3 sec. Of speed to be able to efficiently manipulate small objects Baseline: R: 26 sec., L: 22 sec. Goal status: INITIAL  6.  Pt. Will demonstrate visual compensatory strategies for ADL, and IADL tasks for both tabletop tasks, and tasks within her environment. Baseline: Eval: Education to be provided. Goal status: INITIAL  ASSESSMENT:  CLINICAL IMPRESSION: Patient is a 68 y.o. female who was seen today for occupational therapy evaluation for CVA. Pt. presents with visual changes, RUE weakness, and impaired [facility] skills which limits her ability to efficiently complete daily tasks, manipulate small objects, securely hold items, and open bottles and containers. Pt. Will benefit from OT services to work towards improving RUE functioning, reviewing visual compensatory strategies, and maximizing independence with ADLs, and IADL tasks.  PERFORMANCE DEFICITS: in functional skills including ADLs, IADLs, coordination, dexterity, proprioception, ROM, strength, Fine motor control, Gross motor control, and UE functional use, cognitive skills including memory, and psychosocial skills including environmental adaptation and routines and behaviors.   IMPAIRMENTS: are limiting patient from ADLs, IADLs, and leisure.   CO-MORBIDITIES: may have co-morbidities  that  affects occupational performance. Patient will benefit from skilled OT to address above impairments and improve overall function.  MODIFICATION OR ASSISTANCE TO COMPLETE EVALUATION: Min-Moderate modification of tasks or assist with assess necessary to complete an evaluation.  OT OCCUPATIONAL PROFILE AND HISTORY: Detailed assessment: Review of records and additional review of physical, cognitive, psychosocial history related to current functional performance.  CLINICAL DECISION MAKING: Moderate - several treatment options, min-mod task modification necessary  REHAB POTENTIAL: Good  EVALUATION COMPLEXITY: Moderate    PLAN:  OT FREQUENCY: 2x/week  OT DURATION: 12 weeks  PLANNED INTERVENTIONS: 97168 OT Re-evaluation, 97535 self care/ADL training, 04540 therapeutic exercise, 97530 therapeutic activity, 97112 neuromuscular re-education, 97018 paraffin, 98119 moist heat, 97034 contrast bath, 97129 Cognitive training (first 15 min), 14782 Cognitive training(each additional 15 min), passive range of motion, functional mobility training, visual/perceptual remediation/compensation, patient/family education, and  DME and/or AE instructions  RECOMMENDED OTHER SERVICES: PT, ST  CONSULTED AND AGREED WITH PLAN OF CARE: Patient  PLAN FOR NEXT SESSION: Treatment   Olegario Messier, MS, OTR/L  07/28/2023, 11:53 AM

## 2023-07-29 ENCOUNTER — Ambulatory Visit: Payer: 59

## 2023-07-30 NOTE — Therapy (Signed)
 OUTPATIENT SPEECH LANGUAGE PATHOLOGY  COGNITION EVALUATION ONLY   Patient Name: Christine Reyes MRN: [id number] DOB:09-13-1955, 68 y.o., female Today's Date: 07/30/2023  PCP: Barbette Reichmann, MD REFERRING PROVIDER: Mariam Dollar, PA-C   End of Session - 07/30/23 0841     Visit Number 1    SLP Start Time 1105    SLP Stop Time  1145    SLP Time Calculation (min) 40 min    Activity Tolerance Patient tolerated treatment well             Past Medical History:  Diagnosis Date   Anxiety    Arthritis    Headache    Hypertension    Iron deficiency anemia 06/17/2018   Obesity    Sleep apnea    Past Surgical History:  Procedure Laterality Date   ABDOMINAL HYSTERECTOMY     BREAST EXCISIONAL BIOPSY Right 1989   CHOLECYSTECTOMY     COLONOSCOPY WITH ESOPHAGOGASTRODUODENOSCOPY (EGD)     csection x2     DILATION AND CURETTAGE OF UTERUS     ESOPHAGOGASTRODUODENOSCOPY (EGD) WITH PROPOFOL N/A 12/14/2015   Procedure: ESOPHAGOGASTRODUODENOSCOPY (EGD) WITH PROPOFOL;  Surgeon: Scot Jun, MD;  Location: [facility] ENDOSCOPY;  Service: Endoscopy;  Laterality: N/A;   GASTRIC BYPASS     JOINT REPLACEMENT     left knee surgery     TOTAL KNEE ARTHROPLASTY Right 06/22/2017   Procedure: TOTAL KNEE ARTHROPLASTY;  Surgeon: Lyndle Herrlich, MD;  Location: [facility] ORS;  Service: Orthopedics;  Laterality: Right;   Patient Active Problem List   Diagnosis Date Noted   Gait abnormality 06/30/2023   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   CVA (cerebral vascular accident) (HCC) 05/08/2023   Hyponatremia 05/08/2023   Evaluation by psychiatric service required 02/08/2019   Spinal stenosis, lumbar region, with neurogenic claudication 01/18/2019   Lumbar facet arthropathy 01/18/2019   Lumbar spondylosis 01/18/2019   Lumbar degenerative disc disease 01/18/2019   Primary osteoarthritis of left knee 01/18/2019   Chronic pain syndrome 01/18/2019   Acute diastolic congestive heart failure (HCC)  01/18/2019   Iron deficiency anemia 06/17/2018   Status post bariatric surgery 03/31/2018   BMI 33.0-33.9,adult 03/31/2018   History of total knee arthroplasty 07/15/2017   History of total right knee replacement 06/22/2017   Gastroesophageal reflux disease without esophagitis 03/20/2016   Functional diarrhea 03/20/2016   Pleural effusion, right 03/19/2016   Morbid obesity (HCC) 03/06/2016   Hypertensive disorder 07/24/2015   Obstructive sleep apnea on CPAP 07/24/2015   Gallstone 07/24/2015   Primary osteoarthritis of both knees 07/04/2015   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   Arthritis, senescent 09/30/2013   Low back pain 09/30/2013    ONSET DATE: 05/08/2023; date of referral 05/22/2023  REFERRING DIAG: I63.50 (ICD-10-CM) - Posterior circulation stroke (HCC)   THERAPY DIAG:  Cognitive communication deficit  Rationale for Evaluation and Treatment Rehabilitation  SUBJECTIVE:   SUBJECTIVE STATEMENT: Pt pleasant, states that her speech and "thinking" has gotten better, she reports resumption of driving "I am good with the speech, it is just the vision its the worse,"   Pt accompanied by: self  PERTINENT HISTORY and DIAGNOSTIC FINDINGS: Pt is a 68 year old female with past medical history of HTN, HLD, chronic insomnia, hsitory of DVT, chronic low back pain and knee pain, iron deficiency,, history of bilateral knee replacement. MRI brain on Dec 27th 2024. Acute infarcts in the bilateral occipital lobes, left greater than right, which correlate The bilateral occipital infarcts are associated with foci of  hemosiderin deposition, which may represent thromboemboli. Additional punctate acute infarcts in the right cerebellum and possible acute infarct in the right lateral pons. Pt attended CIR from 05/15/2023 thru 05/25/2023 with ST services targeting anomia, memory and mild dysarthria.   PAIN:  Are you having pain? No   FALLS: Has patient fallen in last 6 months?  No  LIVING  ENVIRONMENT: Lives with: lives with their family Lives in: House/apartment  PLOF:  Level of assistance: Independent with ADLs, Independent with IADLs  PATIENT GOALS   none stated -   OBJECTIVE:   COGNITIVE COMMUNICATION Overall cognitive status: Within functional limits for tasks assessed  AUDITORY COMPREHENSION  Overall auditory comprehension: Appears intact YES/NO questions: Appears intact Following directions: Appears intact Conversation: Simple  READING COMPREHENSION: Intact  EXPRESSION: verbal  VERBAL EXPRESSION:   Overall verbal expression: Appears intact Level of generative/spontaneous verbalization: conversation Automatic speech: name: intact and social response: intact  Repetition: Appears intact Naming: Responsive: 76-100%, Confrontation: 76-100%, Convergent: 76-100%, and Divergent: 76-100% Pragmatics: Appears intact Non-verbal means of communication: N/A   ORAL MOTOR EXAMINATION Facial : WFL Lingual: WFL Velum: WFL Mandible: WFL Cough: WFL Voice: WFL  MOTOR SPEECH: Overall motor speech: Appears intact Respiration: diaphragmatic/abdominal breathing Phonation: normal Resonance: WFL Articulation: Appears intact Intelligibility: Intelligible Motor planning: Appears intact  STANDARDIZED ASSESSMENTS:   Cognitive Linguistic Quick Test: AGE - 18 - 69   The Cognitive Linguistic Quick Test (CLQT) was administered to assess the relative status of five cognitive domains: attention, memory, language, executive functioning, and visuospatial skills. Scores from 10 tasks were used to estimate severity ratings (standardized for age groups 18-69 years and 70-89 years) for each domain, a clock drawing task, as well as an overall composite severity rating of cognition.       Task Score Criterion Cut Scores  Personal Facts 8/8 8  Symbol Cancellation 12/12 11  Confrontation Naming 10/10 10  Clock Drawing  12/13 12  Story Retelling 7/10 6  Symbol Trails 10/10 9   Generative Naming 5/9 5  Design Memory 4/6 5  Mazes  8/8 7  Design Generation 6/13 6    Cognitive Domain Composite Score Severity Rating  Attention 198/215 WNL  Memory 143/185 Mild  Executive Function 29/40 WNL  Language 30/37 WNL  Visuospatial Skills 90/105 WNL  Clock Drawing  12/13 WNL  Composite Severity Rating  WNL    PATIENT EDUCATION: Education details: results of this assessment Person educated: Patient Education method: Explanation Education comprehension: verbalized understanding    ASSESSMENT:  CLINICAL IMPRESSION: Patient is a 68 y.o. female who was seen today for a cognitive communication evaluation. She presents with improved abilities as further supported by scores on the CLQT. While she displays some mild deficits in memory, she reports this as baseline and is able to describe various memory strategies that she successfully employs. At this time, all education has been completed, questions were answered to pt satisfaction and no further ST services are indicated. Pt is in agreement. Marland Kitchen    PLAN:  Pt no longer requires skilled ST intervention.   Amarri Satterly B. Dreama Saa, M.S., CCC-SLP, Tree surgeon Certified Brain Injury Specialist [facility]  [facility] Rehabilitation Services Office [phone] Ascom [phone] Fax [phone]

## 2023-08-03 ENCOUNTER — Ambulatory Visit: Admitting: Physical Therapy

## 2023-08-03 ENCOUNTER — Ambulatory Visit

## 2023-08-03 DIAGNOSIS — M6281 Muscle weakness (generalized): Secondary | ICD-10-CM

## 2023-08-03 DIAGNOSIS — R278 Other lack of coordination: Secondary | ICD-10-CM

## 2023-08-03 DIAGNOSIS — M5459 Other low back pain: Secondary | ICD-10-CM

## 2023-08-03 NOTE — Therapy (Signed)
 OUTPATIENT PHYSICAL THERAPY NEURO/back Treatment   Patient Name: Christine Reyes MRN: [id number] DOB:August 13, 1955, 68 y.o., female Today's Date: 08/03/2023   PCP:   Barbette Reichmann, MD   REFERRING PROVIDER:    Charlton Amor, PA-C     END OF SESSION:  PT End of Session - 08/03/23 1142     Visit Number 2    Number of Visits 24    Date for PT Re-Evaluation 10/20/23    PT Start Time 1145    PT Stop Time 1225    PT Time Calculation (min) 40 min    Equipment Utilized During Treatment Gait belt    Behavior During Therapy WFL for tasks assessed/performed             Past Medical History:  Diagnosis Date   Anxiety    Arthritis    Headache    Hypertension    Iron deficiency anemia 06/17/2018   Obesity    Sleep apnea    Past Surgical History:  Procedure Laterality Date   ABDOMINAL HYSTERECTOMY     BREAST EXCISIONAL BIOPSY Right 1989   CHOLECYSTECTOMY     COLONOSCOPY WITH ESOPHAGOGASTRODUODENOSCOPY (EGD)     csection x2     DILATION AND CURETTAGE OF UTERUS     ESOPHAGOGASTRODUODENOSCOPY (EGD) WITH PROPOFOL N/A 12/14/2015   Procedure: ESOPHAGOGASTRODUODENOSCOPY (EGD) WITH PROPOFOL;  Surgeon: Scot Jun, MD;  Location: [facility] ENDOSCOPY;  Service: Endoscopy;  Laterality: N/A;   GASTRIC BYPASS     JOINT REPLACEMENT     left knee surgery     TOTAL KNEE ARTHROPLASTY Right 06/22/2017   Procedure: TOTAL KNEE ARTHROPLASTY;  Surgeon: Lyndle Herrlich, MD;  Location: [facility] ORS;  Service: Orthopedics;  Laterality: Right;   Patient Active Problem List   Diagnosis Date Noted   Gait abnormality 06/30/2023   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   CVA (cerebral vascular accident) (HCC) 05/08/2023   Hyponatremia 05/08/2023   Evaluation by psychiatric service required 02/08/2019   Spinal stenosis, lumbar region, with neurogenic claudication 01/18/2019   Lumbar facet arthropathy 01/18/2019   Lumbar spondylosis 01/18/2019   Lumbar degenerative disc disease 01/18/2019    Primary osteoarthritis of left knee 01/18/2019   Chronic pain syndrome 01/18/2019   Acute diastolic congestive heart failure (HCC) 01/18/2019   Iron deficiency anemia 06/17/2018   Status post bariatric surgery 03/31/2018   BMI 33.0-33.9,adult 03/31/2018   History of total knee arthroplasty 07/15/2017   History of total right knee replacement 06/22/2017   Gastroesophageal reflux disease without esophagitis 03/20/2016   Functional diarrhea 03/20/2016   Pleural effusion, right 03/19/2016   Morbid obesity (HCC) 03/06/2016   Hypertensive disorder 07/24/2015   Obstructive sleep apnea on CPAP 07/24/2015   Gallstone 07/24/2015   Primary osteoarthritis of both knees 07/04/2015   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   Arthritis, senescent 09/30/2013   Low back pain 09/30/2013    ONSET DATE: 05/08/2023   REFERRING DIAG:  Diagnosis  I63.50 (ICD-10-CM) - Posterior circulation stroke (HCC)    THERAPY DIAG:  Muscle weakness (generalized)  Other lack of coordination  Other low back pain  Rationale for Evaluation and Treatment: Rehabilitation  SUBJECTIVE:  SUBJECTIVE STATEMENT:  Pt reports that back was really bothering her this morning 8/10 at the start of OT treatment. OT provided heat, and pain has decreased to 6/10.     Reports CVA in December of 2024. Was hospitalized and then received inpatient rehab services for 11days. States that she has had increased pain/stiffness in Back, hips and knees. Since hospitalization. Reports that Prior to CVA, she experienced mild low back pain, but now her entire back has been bothering her. Continues to report weakness in the R hand and balance deficits continue since d/c from CIR. No falls   Pt accompanied by: self  PERTINENT HISTORY:  From recent Hospital  admission: "68 y.o. right-handed female with history significant for anxiety hypertension, iron deficiency anemia, obesity status post gastric bypass with BMI 39.77, quit smoking 7 years ago, right TKA 2019 complicated by DVT.  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  1 level home 3 steps to entry.  Independent prior to admission.  There is a granddaughter and a daughter in the area with good support.  Presented to [facility] 05/08/2023 after reported fall with altered mental status headache generalized weakness as well as blurred vision.  Cranial CT scan showed acute to subacute cortical in the left occipital lobe.  CT cervical spine negative.  CTA of head and neck no emergent large vessel occlusion.  MRI showed acute infarct in the bilateral occipital lobes left greater than right.  The bilateral occipital infarct with foci of hemosiderin deposition possibly representing thromboemboli.  Additional punctate acute infarct in the right cerebellum and possible acute infarct in the right lateral pons.  Patient did not receive tPA.  Bilateral lower extremity ultrasound showed no evidence of DVT in either lower extremity and resolution of left posterior tibial vein DVT.  Admission chemistries unremarkable except sodium 125 osmolality 273 urine osmolality 686 TSH 4.077.  Echocardiogram with ejection fraction of 55 to 60% no wall motion abnormalities grade 1 diastolic dysfunction.  Neurology follow-up placed on aspirin and Plavix for CVA prophylaxis x 3 weeks then aspirin alone.  Lovenox for DVT prophylaxis.  Hyponatremia resolved after initial fluid restriction latest sodium 141.  Tolerating a regular consistency diet.  Therapy evaluations completed due to patient decreased functional mobility was admitted for a comprehensive rehab program     Hospital Course: GRACEMARIE SKEET was admitted to rehab 05/14/2023 for inpatient therapies to consist of PT, ST and OT at least three hours five days a  week. Past admission physiatrist, therapy team and rehab RN have worked together to provide customized collaborative inpatient rehab.  Pertaining to patient's multifocal posterior circulation infarct remained stable.  She will continue on low-dose aspirin and Plavix x 3 weeks through 05/28/2023 then aspirin alone and follow-up neurology services.  Lovenox for DVT prophylaxis.  Pain management use of Neurontin titrated as needed using baclofen as needed for muscle spasms oxycodone for breakthrough pain.  She was having some difficulty in sleeping with assistance of trazodone added with good results.  Lipitor ongoing for hyperlipidemia.  History of gastric bypass BMI 39.77 dietary follow-up.  Iron deficiency anemia monitoring of hemoglobin hematocrit follow-up outpatient.  "  PAIN:  Are you having pain? Yes: NPRS scale: 4/10  Pain location: back  Pain description: hurts pulling  Aggravating factors: walking  Relieving factors: stretching   PRECAUTIONS: Fall  RED FLAGS: None   WEIGHT BEARING RESTRICTIONS: No  FALLS: Has patient fallen in last 6 months? Yes. Number  of falls 1  LIVING ENVIRONMENT: Lives with: lives with their family and lives alone Lives in: House/apartment Stairs: Yes: External: 3 steps; on right going up, on left going up, and can reach both Has following equipment at home: Single point cane and Walker - 2 wheeled uses [facility], mostly Mother will go to adult day care center for 7-8 hours per day. Nursing assistance for care on Sundays from 1 hour   PLOF: Independent and Independent with basic ADLs  PATIENT GOALS: reduce back pain, improve balance,   OBJECTIVE:  Note: Objective measures were completed at Evaluation unless otherwise noted.  DIAGNOSTIC FINDINGS:   Shoulder DG IMPRESSION: Mild AC joint degenerative change.   MRI IMPRESSION: 1. Acute infarcts in the bilateral occipital lobes, left greater than right, which correlate with the hypodensity seen on  the same-day CT. The bilateral occipital infarcts are associated with foci of hemosiderin deposition, which may represent thromboemboli. 2. Additional punctate acute infarcts in the right cerebellum and possible acute infarct in the right lateral pons.  COGNITION: Overall cognitive status: Impaired mild memory deficits.    SENSATION: Light touch: Impaired  and baseline neuropathy in bil finger tips and toes   COORDINATION: Decreased ROM due to strength deficits on the LLE, coordination grossly WFL    POSTURE: rounded shoulders, forward head, decreased lumbar lordosis, and increased thoracic kyphosis  LOWER EXTREMITY ROM:     Active  Right Eval Left Eval  Hip flexion Limited by habitus  Limited by habitus   Hip extension    Hip abduction [facility]  Hip adduction [facility]  Hip internal rotation    Hip external rotation    Knee flexion [facility] WFL  Knee extension [facility]  Ankle dorsiflexion [facility]  Ankle plantarflexion    Ankle inversion    Ankle eversion     (Blank rows = not tested)  Thoracic extension  10   Thoracic flexion  30   Trunk rotation R  45  4  Trunk rotation L  50  4  Ankle dorsiflexion 5 5  Ankle plantarflexion    Ankle inversion    Ankle eversion      LOWER EXTREMITY MMT:    MMT Right Eval Left Eval  Hip flexion 4 4-  Hip extension 4- 4-  Hip abduction 4- 4  Hip adduction 4- 4  Hip internal rotation    Hip external rotation    Knee flexion 4- 4  Knee extension 4- 4  Ankle dorsiflexion 5 5  Ankle plantarflexion    Ankle inversion    Ankle eversion    (Blank rows = not tested)   BED MOBILITY:  Sit to supine Complete Independence Supine to sit Complete Independence Rolling to Right Complete Independence Rolling to Left Complete Independence  TRANSFERS: Assistive device utilized: None  Sit to stand: Complete Independence Stand to sit: Complete Independence Chair to chair: Complete Independence Floor:  no assessed   RAMP:  Level of  Assistance: Complete Independence Assistive device utilized: None Ramp Comments:   CURB:  Level of Assistance: SBA Assistive device utilized:  rail Curb Comments: Heavy BUE support   STAIRS: Level of Assistance: Modified independence Stair Negotiation Technique: Alternating Pattern  with asent. Step to with descent: Bilateral Rails Number of Stairs: 4 Height of Stairs: 6  Comments: diagonal descent    GAIT: Gait pattern: step through pattern Distance walked: 923ft Assistive device utilized: None Level of assistance: SBA Comments: reports hip pain on the R. Increased trunkal  sway with fatigue   FUNCTIONAL TESTS:  5 times sit to stand: 14.41sec  Timed up and go (TUG): 11.96sec  6 minute walk test: 978ft without AD  10 meter walk test: 0.57m/s  Berg Balance Scale: 43 Dynamic Gait Index: 18  PATIENT SURVEYS:  ABC scale 65                                                                                                                              TREATMENT DATE: 07/28/2023    Pt completed ABC and DGI.   PT instructed pt in DGI. See below for results. Demonstrates increased fall risk with score of 18/24. (<19 indicates increased fall risk)   Attempted tandem gait. Unable to perform>2 steps.  Eyes closed x 34ft with halting gait pattern and mild lateral staggering.  Reverse gait with mild anterior LOB initially, then halting gait pattern.     Exercises - Supine Pelvic Rocking  - 10 reps - Supine Lower Trunk Rotation   - 10 reps - Supine Figure 4 Piriformis Stretch  2 x 20 sec bil  - Standing Tandem Balance with Counter Support  2 x 20 sec bil  - Standing Knee Flexion with Counter Support  - 10 reps - Standing March with Counter Support - 10 reps  HS stretch x 20 sec bil  Single knee to chest 022 sec bil.  Mild hip flexor cramp on BLE with end range single knee to chest.    PATIENT EDUCATION: Education details: POC  Person educated: Patient Education method:  Medical illustrator Education comprehension: verbalized understanding  HOME EXERCISE PROGRAM: Access Code: [email or web address] URL: [email or web address] Date: 08/03/2023 Prepared by: Grier Rocher  Exercises - Supine Pelvic Rocking  - 1 x daily - 7 x weekly - 3 sets - 10 reps - Supine Lower Trunk Rotation  - 1 x daily - 7 x weekly - 3 sets - 10 reps - Supine Figure 4 Piriformis Stretch  - 1 x daily - 7 x weekly - 3 sets - 10 reps - Standing Tandem Balance with Counter Support  - 1 x daily - 7 x weekly - 3 sets - 10 reps - Standing Knee Flexion with Counter Support  - 1 x daily - 7 x weekly - 3 sets - 10 reps - Standing March with Counter Support  - 1 x daily - 7 x weekly - 3 sets - 10 reps  GOALS: Goals reviewed with patient? Yes  SHORT TERM GOALS: Target date: 10/21/2023    Patient will be independent in home exercise program to improve strength/mobility for better functional independence with ADLs. Baseline: to be given at visit 2  Goal status: INITIAL   LONG TERM GOALS: Target date: 10/21/2023    Patient will ABC score by equal to or greater than  10%   to demonstrate statistically significant improvement in mobility and quality of life.  Baseline: 65.25% Goal status: INITIAL  2.  Patient (>  71 years old) will complete five times sit to stand test in < 13 seconds indicating an increased LE strength and improved balance. Baseline: 14.41sec Goal status: INITIAL  3.  Patient will increase Berg Balance score by > 6 points to demonstrate decreased fall risk during functional activities Baseline: 43 Goal status: INITIAL  4.  Patient will increase 10 meter walk test to >1.57m/s as to improve gait speed for better community ambulation and to reduce fall risk. Baseline: 0.49m/s  Goal status: INITIAL  5.  Patient will reduce timed up and go to <11 seconds to reduce fall risk and demonstrate improved transfer/gait ability. Baseline: 11.96sec  Goal status:  INITIAL  6.  Patient will increase dynamic gait index score to >19/24 as to demonstrate reduced fall risk and improved dynamic gait balance for better safety with community/home ambulation.   Baseline: 18 Goal status: INITIAL  7. Pt will reduce back pain with gait to 1/10 to improve overall QOL and allow improved return to caregiver requirements for aging mother.  Baseline: 5/10 Goal status: INITIAL   ASSESSMENT:  CLINICAL IMPRESSION: Patient is a 68 y.o. female who was seen today for physical therapy  treatment for CVA and back pain. PT treatment continued balance deficits from CVA as well as continued assessment and HEP initiation for core activation and BLE strengthening with HEP provided. Pt tolerated interventions well and reports pain int he low back 5/10 upon completion of PT treatment. Pt would benefit from skilled PT to improve balance, strength, functional to allow improved QoL and full return to PLOF.   OBJECTIVE IMPAIRMENTS: Abnormal gait, decreased activity tolerance, decreased balance, decreased endurance, decreased mobility, difficulty walking, decreased ROM, decreased strength, improper body mechanics, and pain.   ACTIVITY LIMITATIONS: carrying, lifting, bending, sitting, squatting, stairs, transfers, locomotion level, and caring for others  PARTICIPATION LIMITATIONS: cleaning, laundry, community activity, occupation, and yard work  PERSONAL FACTORS: Age and 1 comorbidity: back pain with lumbar stenosis and CVA   are also affecting patient's functional outcome.   REHAB POTENTIAL: Good  CLINICAL DECISION MAKING: Stable/uncomplicated  EVALUATION COMPLEXITY: Low  PLAN:  PT FREQUENCY: 1-2x/week  PT DURATION: 12 weeks  PLANNED INTERVENTIONS: 97110-Therapeutic exercises, 97530- Therapeutic activity, [id number]- Neuromuscular re-education, 97535- Self Care, [id number]- Manual therapy, [phone]- Gait training, [phone]- Aquatic Therapy, [phone]- Electrical stimulation (unattended), [phone]-  Electrical stimulation (manual), [id number]- Ultrasound, 13086- Traction (mechanical), Patient/Family education, Balance training, Stair training, Taping, Dry Needling, Joint mobilization, Joint manipulation, Spinal manipulation, Spinal mobilization, Scar mobilization, Compression bandaging, Visual/preceptual remediation/compensation, DME instructions, Cryotherapy, Moist heat, and Biofeedback  PLAN FOR NEXT SESSION:  Dynamic balance, BLE strengthening. Core strengthening.    Christine Reyes, PT 08/03/2023, 11:43 AM

## 2023-08-03 NOTE — Therapy (Unsigned)
 OUTPATIENT OCCUPATIONAL THERAPY NEURO TREATMENT NOTE  Patient Name: Christine Reyes MRN: [id number] DOB:09/17/1955, 68 y.o., female Today's Date: 08/03/2023  PCP: Barbette Reichmann, MD REFERRING PROVIDER: Charlton Amor  END OF SESSION:  OT End of Session - 08/03/23 1257     Visit Number 2    Number of Visits 24    Date for OT Re-Evaluation 10/20/23    OT Start Time 1100    OT Stop Time 1145    OT Time Calculation (min) 45 min    Activity Tolerance Patient tolerated treatment well    Behavior During Therapy [facility] for tasks assessed/performed            Past Medical History:  Diagnosis Date   Anxiety    Arthritis    Headache    Hypertension    Iron deficiency anemia 06/17/2018   Obesity    Sleep apnea    Past Surgical History:  Procedure Laterality Date   ABDOMINAL HYSTERECTOMY     BREAST EXCISIONAL BIOPSY Right 1989   CHOLECYSTECTOMY     COLONOSCOPY WITH ESOPHAGOGASTRODUODENOSCOPY (EGD)     csection x2     DILATION AND CURETTAGE OF UTERUS     ESOPHAGOGASTRODUODENOSCOPY (EGD) WITH PROPOFOL N/A 12/14/2015   Procedure: ESOPHAGOGASTRODUODENOSCOPY (EGD) WITH PROPOFOL;  Surgeon: Scot Jun, MD;  Location: [facility] ENDOSCOPY;  Service: Endoscopy;  Laterality: N/A;   GASTRIC BYPASS     JOINT REPLACEMENT     left knee surgery     TOTAL KNEE ARTHROPLASTY Right 06/22/2017   Procedure: TOTAL KNEE ARTHROPLASTY;  Surgeon: Lyndle Herrlich, MD;  Location: [facility] ORS;  Service: Orthopedics;  Laterality: Right;   Patient Active Problem List   Diagnosis Date Noted   Gait abnormality 06/30/2023   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   CVA (cerebral vascular accident) (HCC) 05/08/2023   Hyponatremia 05/08/2023   Evaluation by psychiatric service required 02/08/2019   Spinal stenosis, lumbar region, with neurogenic claudication 01/18/2019   Lumbar facet arthropathy 01/18/2019   Lumbar spondylosis 01/18/2019   Lumbar degenerative disc disease 01/18/2019   Primary  osteoarthritis of left knee 01/18/2019   Chronic pain syndrome 01/18/2019   Acute diastolic congestive heart failure (HCC) 01/18/2019   Iron deficiency anemia 06/17/2018   Status post bariatric surgery 03/31/2018   BMI 33.0-33.9,adult 03/31/2018   History of total knee arthroplasty 07/15/2017   History of total right knee replacement 06/22/2017   Gastroesophageal reflux disease without esophagitis 03/20/2016   Functional diarrhea 03/20/2016   Pleural effusion, right 03/19/2016   Morbid obesity (HCC) 03/06/2016   Hypertensive disorder 07/24/2015   Obstructive sleep apnea on CPAP 07/24/2015   Gallstone 07/24/2015   Primary osteoarthritis of both knees 07/04/2015   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   Arthritis, senescent 09/30/2013   Low back pain 09/30/2013   ONSET DATE: 05/08/2023  REFERRING DIAG: CVA  THERAPY DIAG:  Muscle weakness (generalized)  Other lack of coordination  Rationale for Evaluation and Treatment: Rehabilitation  SUBJECTIVE:   SUBJECTIVE STATEMENT: Pt reports a lot of pain in her back today.  Pt accompanied by: self  PERTINENT HISTORY: Pt. Is a 68 y.o. female who was hospitalized with an acute embolic posterior circulation CVA. MRI imaging revealed: multiple acute infarcts in the bilateral occipital lobes punctate, right cerebellum, and possible right lateral pons. PMHx includes: DVT, Hyperlipidemia, Obesity, GERD, Iron deficiency Anemia, Permissive HTN  PRECAUTIONS: None  WEIGHT BEARING RESTRICTIONS: No  PAIN:  Are you having pain? 8/10 after walking the long distance from  the front entrance in bilateral knees; 8/10 right shoulder  FALLS: Has patient fallen in last 6 months? 1 fall when the CVA occurred  LIVING ENVIRONMENT: Lives with: Resides with her mother; Caregiver  Lives in: [place]; one floor Stairs:  Ramp on the back Has following equipment at home: Single point cane, Shower bench, bed side commode, and Grab bars  PLOF: Independent   Retired Probation officer  PATIENT GOALS: To get stronger, and return to PLOF, improve eyesight  OBJECTIVE:  Note: Objective measures were completed at Evaluation unless otherwise noted.  HAND DOMINANCE: Left; only writes with the left  ADLs:  Transfers/ambulation related to ADLs: Independent Eating: Independent, cutting food. Difficulty opening bottles Grooming: Independent UB Dressing: Independent LB Dressing: Independent Toileting: Independent Bathing: Independent Tub Shower transfers:  Independent   IADLs: Shopping: grandson assists Light housekeeping: increased time required Meal Prep: Independent Community mobility: Has resumed driving  short distance in the day  Medication management: Independent Financial management: No changes noted Handwriting: 90% legible-Cursive name. Pt. Reports is at normal level Hobbies: listening music, being with great grand kids, looking at TV, sitting on the porch  MOBILITY STATUS: Independent Pt. Reports that she uses a cane.  And that this morning she walked from the front entrance to therapy without a cane.  POSTURE COMMENTS:   Sitting balance: intact  ACTIVITY TOLERANCE:  Activity tolerance: Fair  FUNCTIONAL OUTCOME MEASURES: TBA  UPPER EXTREMITY ROM:    Active ROM Right eval Left eval  Shoulder flexion 100(115) 981(191)  Shoulder abduction 70(96) 122(138)  Shoulder adduction    Shoulder extension    Shoulder internal rotation    Shoulder external rotation    Elbow flexion WNL WNL  Elbow extension WNL WNL  Wrist flexion    Wrist extension WNL WNL  Wrist ulnar deviation    Wrist radial deviation    Wrist pronation    Wrist supination    (Blank rows = not tested)  UPPER EXTREMITY MMT:     MMT Right eval Left eval  Shoulder flexion 3-/5 3-/5  Shoulder abduction 3-/5 3-/5  Shoulder adduction    Shoulder extension    Shoulder internal rotation    Shoulder external rotation    Middle trapezius    Lower  trapezius    Elbow flexion 4-/5 4-/5  Elbow extension 4-/5 4-/5  Wrist flexion    Wrist extension 4-/5 4-/5  Wrist ulnar deviation    Wrist radial deviation    Wrist pronation    Wrist supination    (Blank rows = not tested)  HAND FUNCTION: Grip strength: Right: 6 lbs; Left: 16 lbs, Lateral pinch: Right: 8 lbs, Left: 12 lbs, and 3 point pinch: Right: 5 lbs, Left: 10 lbs  COORDINATION: 9 Hole Peg test: Right: 26 sec; Left: 22 sec  SENSATION: Light touch: WFL Proprioception: WFL  EDEMA: WFL  COGNITION: Overall cognitive status: Within functional limits for tasks assessed; reports changes with memory  VISION: Subjective report: Pt. reports difficulty with reading, blurriness Has an eye appointment scheduled in April with [facility]  VISION ASSESSMENT: 08/03/23: -Smooth pursuits: reduced speed into all quadrants -Saccades: reduced speed horizontally and vertically -Visual fields/peripheral: appear WFL Pt reports occasional headaches when trying to read on her phone or watch tv for any more than 5-10 min at a time Requires reps of blinking with attempts at sustained visual attention (ie with visual assessment activities noted above)  PERCEPTION: Not tested  PRAXIS: [facility]  for tasks performed  TREATMENT DATE: 08/03/23:  Therapeutic Exercise: Issued soft pink theraputty and instructed pt in strengthening exercises for R hand, including gross grasping, lateral/2 point/3 point pinching, digit abd/add, and digging coins out of putty.  Able to return demo with intermittent vc for technique to improve quality of movement.  Encouraged completion 5-10 min, 1-2x per day.  Issued visual handout for carry over at home.  Therapeutic Activity: Visual assessment continued from eval; see above  Self Care: -Condition management educ to minimize visual fatigue:    -Encouraged short duration when engaging in activities which require sustained visual focus (ie when reading or watching tv)   -Blinking reps to rest eyes when shifting between near and far focus or to rest eyes from periods of sustained visual focus  -OT assisted to change settings on phone for easier reading and navigation: maximized font size and ensured high contrast settings  -Advised pt reach out to eye doctor re: upcoming appointment to inform them of her CVA in Dec as they may or may not want to schedule her follow up visit within a certain time period post CVA, as pt reports they are unaware of her recent CVA.   PATIENT EDUCATION: Education details: Visual assessment/compensation for visual fatigue Person educated: Patient Education method: Explanation Education comprehension: verbalized understanding, further training needed  HOME EXERCISE PROGRAM: Pink theraputty   GOALS: Goals reviewed with patient? Yes  SHORT TERM GOALS: Target date: 09/08/2023  Pt. Will be independent with HEPs for the RUE. Baseline: Eval: No current HEP Goal status: INITIAL  2.  Pt will be indep to verbalize 2-3 activities to target speed and activity tolerance with visual motor skills. Baseline: Eval: Visual motor HEP not yet initiated; pt demonstrates slow speed with smooth pursuits and saccades, and poor tolerance as noted by excessive blinking during visual assessment activities. Goal status: INITIAL  LONG TERM GOALS: Target date: 10/20/2023  Pt. Will improve bilateral shoulder ROM by 10 degrees to assist with ADL/IADLs Baseline: Eval: Shoulder flexion: R: 100(115), L: 409(811); abduction: R: 70(96), L: 122(138) Goal status: INITIAL  2.  Pt. Will improve bilateral UE strength by 2 mm grades to assist with ADLs, and IADLs  Baseline: Eval: Shoulder flexion: R: 3-/5 L: 3-/5, abduction: R: 3-/5, L: 3-/5, elbow flexion: R: 4-/5, L: 4-/5, extension: R: 4-/5, L: 4-/5, wrist extension: R: 4-/5  L:  4-/5 Goal status: INITIAL  3.  Pt. Will improve right grip strength by 5# to be able to securely hold items. Baseline: Eval: R: 6#, L: 16#  Goal status: INITIAL  4.  Pt. Will improve right pinch strength by 3# to be able to efficiently open bottles, and containers Baseline: Lateral pinch: R: 8# L: 12#, 3 pt. pinch: R: 5#, L: 10# Goal status: INITIAL  5.  Pt. Will improve right hand [facility] skills by 3 sec. Of speed to be able to efficiently manipulate small objects Baseline: R: 26 sec., L: 22 sec. Goal status: INITIAL  6.  Pt. Will demonstrate visual compensatory strategies for ADL, and IADL tasks for both tabletop tasks, and tasks within her environment. Baseline: Eval: Education to be provided. Goal status: INITIAL  7.  Pt will tolerate reading a short magazine article with 1 or no rest breaks. Baseline: Pt reports she can tolerate reading on her phone or from a magazine for only ~5 min before she must stop.  Pt occasionally reports a headache when reading or watching tv. Goal status: INITIAL   ASSESSMENT:  CLINICAL IMPRESSION: Pt reported  mild pain in thumb with theraputty exercises, but reports she had this pain prior to CVA; possibly arthritic pain.  OT encouraged maintaining low reps with theraputty exercises, and avoid any exercises that increase pain.  OT advised that often slow putty squeezes and low reps can improve arthritic pain, as putty can help to loosen up stiff joints.  Pt verbalized understanding.  Visual assessment continued today.  Pt verbalizes and was observed to show signs of visual fatigue during scanning, saccades, and activities which required sustained visual focus.  Pt did find increasing font size on phone to be beneficial, and verbalized understanding of strategies to rest eyes to prevent headaches from visual fatigue.  Goals updated above to address visual motor skills and visual fatigue.  Will benefit from OT services to work towards improving RUE functioning,  reviewing visual compensatory strategies, improving visual motor skills, and maximizing independence with ADLs, and IADL tasks.  PERFORMANCE DEFICITS: in functional skills including ADLs, IADLs, coordination, dexterity, proprioception, ROM, strength, Fine motor control, Gross motor control, and UE functional use, cognitive skills including memory, and psychosocial skills including environmental adaptation and routines and behaviors.   IMPAIRMENTS: are limiting patient from ADLs, IADLs, and leisure.   CO-MORBIDITIES: may have co-morbidities  that affects occupational performance. Patient will benefit from skilled OT to address above impairments and improve overall function.  MODIFICATION OR ASSISTANCE TO COMPLETE EVALUATION: Min-Moderate modification of tasks or assist with assess necessary to complete an evaluation.  OT OCCUPATIONAL PROFILE AND HISTORY: Detailed assessment: Review of records and additional review of physical, cognitive, psychosocial history related to current functional performance.  CLINICAL DECISION MAKING: Moderate - several treatment options, min-mod task modification necessary  REHAB POTENTIAL: Good  EVALUATION COMPLEXITY: Moderate    PLAN:  OT FREQUENCY: 2x/week  OT DURATION: 12 weeks  PLANNED INTERVENTIONS: 97168 OT Re-evaluation, 97535 self care/ADL training, 16109 therapeutic exercise, 97530 therapeutic activity, 97112 neuromuscular re-education, 97018 paraffin, 60454 moist heat, 97034 contrast bath, 97129 Cognitive training (first 15 min), 09811 Cognitive training(each additional 15 min), passive range of motion, functional mobility training, visual/perceptual remediation/compensation, patient/family education, and DME and/or AE instructions  RECOMMENDED OTHER SERVICES: PT, ST  CONSULTED AND AGREED WITH PLAN OF CARE: Patient  PLAN FOR NEXT SESSION: Treatment  Danelle Earthly, MS, OTR/L  08/03/2023, 12:59 PM

## 2023-08-04 ENCOUNTER — Encounter: Payer: 59 | Admitting: Occupational Therapy

## 2023-08-04 ENCOUNTER — Ambulatory Visit: Payer: 59

## 2023-08-05 ENCOUNTER — Encounter: Admitting: Occupational Therapy

## 2023-08-05 ENCOUNTER — Encounter: Admitting: Speech Pathology

## 2023-08-06 ENCOUNTER — Encounter: Payer: 59 | Admitting: Occupational Therapy

## 2023-08-06 ENCOUNTER — Ambulatory Visit: Admitting: Occupational Therapy

## 2023-08-06 ENCOUNTER — Ambulatory Visit: Payer: 59

## 2023-08-06 ENCOUNTER — Ambulatory Visit: Admitting: Physical Therapy

## 2023-08-06 DIAGNOSIS — M6281 Muscle weakness (generalized): Secondary | ICD-10-CM | POA: Diagnosis not present

## 2023-08-06 DIAGNOSIS — R278 Other lack of coordination: Secondary | ICD-10-CM

## 2023-08-06 DIAGNOSIS — M5459 Other low back pain: Secondary | ICD-10-CM

## 2023-08-06 NOTE — Therapy (Signed)
 OUTPATIENT PHYSICAL THERAPY NEURO/back Treatment   Patient Name: Christine Reyes MRN: [id number] DOB:08-26-1955, 68 y.o., female Today's Date: 08/06/2023   PCP:   Barbette Reichmann, MD   REFERRING PROVIDER:    Charlton Amor, PA-C     END OF SESSION:  PT End of Session - 08/06/23 0758     Visit Number 3    Number of Visits 24    Date for PT Re-Evaluation 10/20/23    PT Start Time 0802    PT Stop Time 0842    PT Time Calculation (min) 40 min    Equipment Utilized During Treatment Gait belt    Behavior During Therapy [facility] for tasks assessed/performed             Past Medical History:  Diagnosis Date   Anxiety    Arthritis    Headache    Hypertension    Iron deficiency anemia 06/17/2018   Obesity    Sleep apnea    Past Surgical History:  Procedure Laterality Date   ABDOMINAL HYSTERECTOMY     BREAST EXCISIONAL BIOPSY Right 1989   CHOLECYSTECTOMY     COLONOSCOPY WITH ESOPHAGOGASTRODUODENOSCOPY (EGD)     csection x2     DILATION AND CURETTAGE OF UTERUS     ESOPHAGOGASTRODUODENOSCOPY (EGD) WITH PROPOFOL N/A 12/14/2015   Procedure: ESOPHAGOGASTRODUODENOSCOPY (EGD) WITH PROPOFOL;  Surgeon: Scot Jun, MD;  Location: [facility] ENDOSCOPY;  Service: Endoscopy;  Laterality: N/A;   GASTRIC BYPASS     JOINT REPLACEMENT     left knee surgery     TOTAL KNEE ARTHROPLASTY Right 06/22/2017   Procedure: TOTAL KNEE ARTHROPLASTY;  Surgeon: Lyndle Herrlich, MD;  Location: [facility] ORS;  Service: Orthopedics;  Laterality: Right;   Patient Active Problem List   Diagnosis Date Noted   Gait abnormality 06/30/2023   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   CVA (cerebral vascular accident) (HCC) 05/08/2023   Hyponatremia 05/08/2023   Evaluation by psychiatric service required 02/08/2019   Spinal stenosis, lumbar region, with neurogenic claudication 01/18/2019   Lumbar facet arthropathy 01/18/2019   Lumbar spondylosis 01/18/2019   Lumbar degenerative disc disease 01/18/2019    Primary osteoarthritis of left knee 01/18/2019   Chronic pain syndrome 01/18/2019   Acute diastolic congestive heart failure (HCC) 01/18/2019   Iron deficiency anemia 06/17/2018   Status post bariatric surgery 03/31/2018   BMI 33.0-33.9,adult 03/31/2018   History of total knee arthroplasty 07/15/2017   History of total right knee replacement 06/22/2017   Gastroesophageal reflux disease without esophagitis 03/20/2016   Functional diarrhea 03/20/2016   Pleural effusion, right 03/19/2016   Morbid obesity (HCC) 03/06/2016   Hypertensive disorder 07/24/2015   Obstructive sleep apnea on CPAP 07/24/2015   Gallstone 07/24/2015   Primary osteoarthritis of both knees 07/04/2015   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   Arthritis, senescent 09/30/2013   Low back pain 09/30/2013    ONSET DATE: 05/08/2023   REFERRING DIAG:  Diagnosis  I63.50 (ICD-10-CM) - Posterior circulation stroke (HCC)    THERAPY DIAG:  Muscle weakness (generalized)  Other lack of coordination  Other low back pain  Rationale for Evaluation and Treatment: Rehabilitation  SUBJECTIVE:  SUBJECTIVE STATEMENT:  Pt reports that back was really bothering her this morning 8/10 at the start of OT treatment. OT provided heat, and pain has decreased to 6/10.     Reports CVA in December of 2024. Was hospitalized and then received inpatient rehab services for 11days. States that she has had increased pain/stiffness in Back, hips and knees. Since hospitalization. Reports that Prior to CVA, she experienced mild low back pain, but now her entire back has been bothering her. Continues to report weakness in the R hand and balance deficits continue since d/c from CIR. No falls   Pt accompanied by: self  PERTINENT HISTORY:  From recent Hospital  admission: "68 y.o. right-handed female with history significant for anxiety hypertension, iron deficiency anemia, obesity status post gastric bypass with BMI 39.77, quit smoking 7 years ago, right TKA 2019 complicated by DVT.  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  1 level home 3 steps to entry.  Independent prior to admission.  There is a granddaughter and a daughter in the area with good support.  Presented to [facility] 05/08/2023 after reported fall with altered mental status headache generalized weakness as well as blurred vision.  Cranial CT scan showed acute to subacute cortical in the left occipital lobe.  CT cervical spine negative.  CTA of head and neck no emergent large vessel occlusion.  MRI showed acute infarct in the bilateral occipital lobes left greater than right.  The bilateral occipital infarct with foci of hemosiderin deposition possibly representing thromboemboli.  Additional punctate acute infarct in the right cerebellum and possible acute infarct in the right lateral pons.  Patient did not receive tPA.  Bilateral lower extremity ultrasound showed no evidence of DVT in either lower extremity and resolution of left posterior tibial vein DVT.  Admission chemistries unremarkable except sodium 125 osmolality 273 urine osmolality 686 TSH 4.077.  Echocardiogram with ejection fraction of 55 to 60% no wall motion abnormalities grade 1 diastolic dysfunction.  Neurology follow-up placed on aspirin and Plavix for CVA prophylaxis x 3 weeks then aspirin alone.  Lovenox for DVT prophylaxis.  Hyponatremia resolved after initial fluid restriction latest sodium 141.  Tolerating a regular consistency diet.  Therapy evaluations completed due to patient decreased functional mobility was admitted for a comprehensive rehab program     Hospital Course: Christine Reyes was admitted to rehab 05/14/2023 for inpatient therapies to consist of PT, ST and OT at least three hours five days a  week. Past admission physiatrist, therapy team and rehab RN have worked together to provide customized collaborative inpatient rehab.  Pertaining to patient's multifocal posterior circulation infarct remained stable.  She will continue on low-dose aspirin and Plavix x 3 weeks through 05/28/2023 then aspirin alone and follow-up neurology services.  Lovenox for DVT prophylaxis.  Pain management use of Neurontin titrated as needed using baclofen as needed for muscle spasms oxycodone for breakthrough pain.  She was having some difficulty in sleeping with assistance of trazodone added with good results.  Lipitor ongoing for hyperlipidemia.  History of gastric bypass BMI 39.77 dietary follow-up.  Iron deficiency anemia monitoring of hemoglobin hematocrit follow-up outpatient.  "  PAIN:  Are you having pain? Yes: NPRS scale: 4/10  Pain location: back  Pain description: hurts pulling  Aggravating factors: walking  Relieving factors: stretching   PRECAUTIONS: Fall  RED FLAGS: None   WEIGHT BEARING RESTRICTIONS: No  FALLS: Has patient fallen in last 6 months? Yes. Number  of falls 1  LIVING ENVIRONMENT: Lives with: lives with their family and lives alone Lives in: House/apartment Stairs: Yes: External: 3 steps; on right going up, on left going up, and can reach both Has following equipment at home: Single point cane and Walker - 2 wheeled uses [facility], mostly Mother will go to adult day care center for 7-8 hours per day. Nursing assistance for care on Sundays from 1 hour   PLOF: Independent and Independent with basic ADLs  PATIENT GOALS: reduce back pain, improve balance,   OBJECTIVE:  Note: Objective measures were completed at Evaluation unless otherwise noted.  DIAGNOSTIC FINDINGS:   Shoulder DG IMPRESSION: Mild AC joint degenerative change.   MRI IMPRESSION: 1. Acute infarcts in the bilateral occipital lobes, left greater than right, which correlate with the hypodensity seen on  the same-day CT. The bilateral occipital infarcts are associated with foci of hemosiderin deposition, which may represent thromboemboli. 2. Additional punctate acute infarcts in the right cerebellum and possible acute infarct in the right lateral pons.  COGNITION: Overall cognitive status: Impaired mild memory deficits.    SENSATION: Light touch: Impaired  and baseline neuropathy in bil finger tips and toes   COORDINATION: Decreased ROM due to strength deficits on the LLE, coordination grossly WFL    POSTURE: rounded shoulders, forward head, decreased lumbar lordosis, and increased thoracic kyphosis  LOWER EXTREMITY ROM:     Active  Right Eval Left Eval  Hip flexion Limited by habitus  Limited by habitus   Hip extension    Hip abduction [facility]  Hip adduction [facility]  Hip internal rotation    Hip external rotation    Knee flexion [facility] WFL  Knee extension [facility]  Ankle dorsiflexion [facility]  Ankle plantarflexion    Ankle inversion    Ankle eversion     (Blank rows = not tested)  Thoracic extension  10   Thoracic flexion  30   Trunk rotation R  45  4  Trunk rotation L  50  4  Ankle dorsiflexion 5 5  Ankle plantarflexion    Ankle inversion    Ankle eversion      LOWER EXTREMITY MMT:    MMT Right Eval Left Eval  Hip flexion 4 4-  Hip extension 4- 4-  Hip abduction 4- 4  Hip adduction 4- 4  Hip internal rotation    Hip external rotation    Knee flexion 4- 4  Knee extension 4- 4  Ankle dorsiflexion 5 5  Ankle plantarflexion    Ankle inversion    Ankle eversion    (Blank rows = not tested)   BED MOBILITY:  Sit to supine Complete Independence Supine to sit Complete Independence Rolling to Right Complete Independence Rolling to Left Complete Independence  TRANSFERS: Assistive device utilized: None  Sit to stand: Complete Independence Stand to sit: Complete Independence Chair to chair: Complete Independence Floor:  no assessed   RAMP:  Level of  Assistance: Complete Independence Assistive device utilized: None Ramp Comments:   CURB:  Level of Assistance: SBA Assistive device utilized:  rail Curb Comments: Heavy BUE support   STAIRS: Level of Assistance: Modified independence Stair Negotiation Technique: Alternating Pattern  with asent. Step to with descent: Bilateral Rails Number of Stairs: 4 Height of Stairs: 6  Comments: diagonal descent    GAIT: Gait pattern: step through pattern Distance walked: 91ft Assistive device utilized: None Level of assistance: SBA Comments: reports hip pain on the R. Increased trunkal  sway with fatigue   FUNCTIONAL TESTS:  5 times sit to stand: 14.41sec  Timed up and go (TUG): 11.96sec  6 minute walk test: 980ft without AD  10 meter walk test: 0.20m/s  Berg Balance Scale: 43 Dynamic Gait Index: 18  PATIENT SURVEYS:  ABC scale 65                                                                                                                              TREATMENT DATE: 07/28/2023    Nustep level 1-4 x 5 min. Cues for consistent SPM through varied resistance.   Seated trunk rotation with RTB x 12 bil with 2 sec hold  Wall plank at rail walk out x 5 with 3 sec hold at end position  Wall plank at rail with single limb reach to wall x 10 bil   Seated figure 4 stretch 2 x 25-30 sce with overpressure at end range  Seated HS stretch 3 x 20 sec  Seated trunk rotation to neutral x 12 bil, RTB.  Standing at rail  Standing on 1 LE 2 x 15 sec bil  March x 12 bil intermittent UE supprt  Hip extension x 12 bil  Hip abduction x 12 bil  Narrow stance Eyes open 20 sec/eyes closed 10 sec x 3 bil  no UE support  Tandem stance 12 sec x 3 bil  no UE support   Sit<>stand x 10   Supervision assist throughout session except tandem stance CGA from PT with tactile cues for weight shift to the L.   PATIENT EDUCATION: Education details: POC  Person educated: Patient Education method: Fish farm manager Education comprehension: verbalized understanding  HOME EXERCISE PROGRAM: Access Code: [email or web address] URL: [email or web address] Date: 08/03/2023 Prepared by: Grier Rocher  Exercises - Supine Pelvic Rocking  - 1 x daily - 7 x weekly - 3 sets - 10 reps - Supine Lower Trunk Rotation  - 1 x daily - 7 x weekly - 3 sets - 10 reps - Supine Figure 4 Piriformis Stretch  - 1 x daily - 7 x weekly - 3 sets - 10 reps - Standing Tandem Balance with Counter Support  - 1 x daily - 7 x weekly - 3 sets - 10 reps - Standing Knee Flexion with Counter Support  - 1 x daily - 7 x weekly - 3 sets - 10 reps - Standing March with Counter Support  - 1 x daily - 7 x weekly - 3 sets - 10 reps  GOALS: Goals reviewed with patient? Yes  SHORT TERM GOALS: Target date: 10/21/2023    Patient will be independent in home exercise program to improve strength/mobility for better functional independence with ADLs. Baseline: to be given at visit 2  Goal status: INITIAL   LONG TERM GOALS: Target date: 10/21/2023    Patient will ABC score by equal to or greater than  10%   to demonstrate statistically significant improvement in  mobility and quality of life.  Baseline: 65.25% Goal status: INITIAL  2.  Patient (> 68 years old) will complete five times sit to stand test in < 13 seconds indicating an increased LE strength and improved balance. Baseline: 14.41sec Goal status: INITIAL  3.  Patient will increase Berg Balance score by > 6 points to demonstrate decreased fall risk during functional activities Baseline: 43 Goal status: INITIAL  4.  Patient will increase 10 meter walk test to >1.16m/s as to improve gait speed for better community ambulation and to reduce fall risk. Baseline: 0.74m/s  Goal status: INITIAL  5.  Patient will reduce timed up and go to <11 seconds to reduce fall risk and demonstrate improved transfer/gait ability. Baseline: 11.96sec  Goal status: INITIAL  6.   Patient will increase dynamic gait index score to >19/24 as to demonstrate reduced fall risk and improved dynamic gait balance for better safety with community/home ambulation.   Baseline: 18 Goal status: INITIAL  7. Pt will reduce back pain with gait to 1/10 to improve overall QOL and allow improved return to caregiver requirements for aging mother.  Baseline: 5/10 Goal status: INITIAL   ASSESSMENT:  CLINICAL IMPRESSION: Patient is a 68 y.o. female who was seen today for physical therapy  treatment for CVA and back pain. PT treatment continued balance deficits from and LBP management to improve core activation and increase hip ROM. Tolerated well, with only mild pain with hip extension reported by pt.  Pt would benefit from skilled PT to improve balance, strength, functional to allow improved QoL and full return to PLOF.   OBJECTIVE IMPAIRMENTS: Abnormal gait, decreased activity tolerance, decreased balance, decreased endurance, decreased mobility, difficulty walking, decreased ROM, decreased strength, improper body mechanics, and pain.   ACTIVITY LIMITATIONS: carrying, lifting, bending, sitting, squatting, stairs, transfers, locomotion level, and caring for others  PARTICIPATION LIMITATIONS: cleaning, laundry, community activity, occupation, and yard work  PERSONAL FACTORS: Age and 1 comorbidity: back pain with lumbar stenosis and CVA   are also affecting patient's functional outcome.   REHAB POTENTIAL: Good  CLINICAL DECISION MAKING: Stable/uncomplicated  EVALUATION COMPLEXITY: Low  PLAN:  PT FREQUENCY: 1-2x/week  PT DURATION: 12 weeks  PLANNED INTERVENTIONS: 97110-Therapeutic exercises, 97530- Therapeutic activity, [id number]- Neuromuscular re-education, 97535- Self Care, 16109- Manual therapy, [phone]- Gait training, [phone]- Aquatic Therapy, [phone]- Electrical stimulation (unattended), [phone]- Electrical stimulation (manual), [id number]- Ultrasound, 13086- Traction (mechanical),  Patient/Family education, Balance training, Stair training, Taping, Dry Needling, Joint mobilization, Joint manipulation, Spinal manipulation, Spinal mobilization, Scar mobilization, Compression bandaging, Visual/preceptual remediation/compensation, DME instructions, Cryotherapy, Moist heat, and Biofeedback  PLAN FOR NEXT SESSION:   Dynamic balance, BLE strengthening. Core strengthening.    Golden Pop, PT 08/06/2023, 7:58 AM

## 2023-08-06 NOTE — Therapy (Addendum)
 OUTPATIENT OCCUPATIONAL THERAPY NEURO TREATMENT NOTE  Patient Name: Christine Reyes MRN: [id number] DOB:November 05, 1955, 68 y.o., female Today's Date: 08/06/2023  PCP: Antonio Baumgarten, MD REFERRING PROVIDER: Sterling Eisenmenger  END OF SESSION:  OT End of Session - 08/06/23 1316     Visit Number 3    Number of Visits 24    Date for OT Re-Evaluation 10/20/23    OT Start Time 0845    OT Stop Time 0930    OT Time Calculation (min) 45 min    Activity Tolerance Patient tolerated treatment well    Behavior During Therapy [facility] for tasks assessed/performed            Past Medical History:  Diagnosis Date   Anxiety    Arthritis    Headache    Hypertension    Iron deficiency anemia 06/17/2018   Obesity    Sleep apnea    Past Surgical History:  Procedure Laterality Date   ABDOMINAL HYSTERECTOMY     BREAST EXCISIONAL BIOPSY Right 1989   CHOLECYSTECTOMY     COLONOSCOPY WITH ESOPHAGOGASTRODUODENOSCOPY (EGD)     csection x2     DILATION AND CURETTAGE OF UTERUS     ESOPHAGOGASTRODUODENOSCOPY (EGD) WITH PROPOFOL N/A 12/14/2015   Procedure: ESOPHAGOGASTRODUODENOSCOPY (EGD) WITH PROPOFOL;  Surgeon: Cassie Click, MD;  Location: [facility] ENDOSCOPY;  Service: Endoscopy;  Laterality: N/A;   GASTRIC BYPASS     JOINT REPLACEMENT     left knee surgery     TOTAL KNEE ARTHROPLASTY Right 06/22/2017   Procedure: TOTAL KNEE ARTHROPLASTY;  Surgeon: Jerlyn Moons, MD;  Location: [facility] ORS;  Service: Orthopedics;  Laterality: Right;   Patient Active Problem List   Diagnosis Date Noted   Gait abnormality 06/30/2023   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   CVA (cerebral vascular accident) (HCC) 05/08/2023   Hyponatremia 05/08/2023   Evaluation by psychiatric service required 02/08/2019   Spinal stenosis, lumbar region, with neurogenic claudication 01/18/2019   Lumbar facet arthropathy 01/18/2019   Lumbar spondylosis 01/18/2019   Lumbar degenerative disc disease 01/18/2019    Primary osteoarthritis of left knee 01/18/2019   Chronic pain syndrome 01/18/2019   Acute diastolic congestive heart failure (HCC) 01/18/2019   Iron deficiency anemia 06/17/2018   Status post bariatric surgery 03/31/2018   BMI 33.0-33.9,adult 03/31/2018   History of total knee arthroplasty 07/15/2017   History of total right knee replacement 06/22/2017   Gastroesophageal reflux disease without esophagitis 03/20/2016   Functional diarrhea 03/20/2016   Pleural effusion, right 03/19/2016   Morbid obesity (HCC) 03/06/2016   Hypertensive disorder 07/24/2015   Obstructive sleep apnea on CPAP 07/24/2015   Gallstone 07/24/2015   Primary osteoarthritis of both knees 07/04/2015   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   Arthritis, senescent 09/30/2013   Low back pain 09/30/2013   ONSET DATE: 05/08/2023  REFERRING DIAG: CVA  THERAPY DIAG:  Muscle weakness (generalized)  Other lack of coordination  Rationale for Evaluation and Treatment: Rehabilitation  SUBJECTIVE:   SUBJECTIVE STATEMENT: Pt. Reports having an eye appointment this afternoon. Pt. Reports that she though in was in April.  Pt accompanied by: self  PERTINENT HISTORY: Pt. Is a 68 y.o. female who was hospitalized with an acute embolic posterior circulation CVA. MRI imaging revealed: multiple acute infarcts in the bilateral occipital lobes punctate, right cerebellum, and possible right lateral pons. PMHx includes: DVT, Hyperlipidemia, Obesity, GERD, Iron deficiency Anemia, Permissive HTN  PRECAUTIONS: None  WEIGHT BEARING RESTRICTIONS: No  PAIN:  Are you having pain? Intermittent discomfort with gripping pink theraputty-NR  FALLS: Has patient fallen in last 6 months? 1 fall when the CVA occurred  LIVING ENVIRONMENT: Lives with: Resides with her mother; Caregiver  Lives in: [place]; one floor Stairs:  Ramp on the back Has following equipment at home: Single point cane, Shower bench, bed side commode, and Grab bars  PLOF:  Independent  Retired Probation officer  PATIENT GOALS: To get stronger, and return to PLOF, improve eyesight  OBJECTIVE:  Note: Objective measures were completed at Evaluation unless otherwise noted.  HAND DOMINANCE: Left; only writes with the left  ADLs:  Transfers/ambulation related to ADLs: Independent Eating: Independent, cutting food. Difficulty opening bottles Grooming: Independent UB Dressing: Independent LB Dressing: Independent Toileting: Independent Bathing: Independent Tub Shower transfers:  Independent   IADLs: Shopping: grandson assists Light housekeeping: increased time required Meal Prep: Independent Community mobility: Has resumed driving  short distance in the day  Medication management: Independent Financial management: No changes noted Handwriting: 90% legible-Cursive name. Pt. Reports is at normal level Hobbies: listening music, being with great grand kids, looking at TV, sitting on the porch  MOBILITY STATUS: Independent Pt. Reports that she uses a cane.  And that this morning she walked from the front entrance to therapy without a cane.  POSTURE COMMENTS:   Sitting balance: intact  ACTIVITY TOLERANCE:  Activity tolerance: Fair  FUNCTIONAL OUTCOME MEASURES: TBA  UPPER EXTREMITY ROM:    Active ROM Right eval Left eval  Shoulder flexion 100(115) 086(578)  Shoulder abduction 70(96) 122(138)  Shoulder adduction    Shoulder extension    Shoulder internal rotation    Shoulder external rotation    Elbow flexion WNL WNL  Elbow extension WNL WNL  Wrist flexion    Wrist extension WNL WNL  Wrist ulnar deviation    Wrist radial deviation    Wrist pronation    Wrist supination    (Blank rows = not tested)  UPPER EXTREMITY MMT:     MMT Right eval Left eval  Shoulder flexion 3-/5 3-/5  Shoulder abduction 3-/5 3-/5  Shoulder adduction    Shoulder extension    Shoulder internal rotation    Shoulder external rotation    Middle trapezius     Lower trapezius    Elbow flexion 4-/5 4-/5  Elbow extension 4-/5 4-/5  Wrist flexion    Wrist extension 4-/5 4-/5  Wrist ulnar deviation    Wrist radial deviation    Wrist pronation    Wrist supination    (Blank rows = not tested)  HAND FUNCTION: Grip strength: Right: 6 lbs; Left: 16 lbs, Lateral pinch: Right: 8 lbs, Left: 12 lbs, and 3 point pinch: Right: 5 lbs, Left: 10 lbs  COORDINATION: 9 Hole Peg test: Right: 26 sec; Left: 22 sec  SENSATION: Light touch: WFL Proprioception: WFL  EDEMA: WFL  COGNITION: Overall cognitive status: Within functional limits for tasks assessed; reports changes with memory  VISION: Subjective report: Pt. reports difficulty with reading, blurriness Has an eye appointment scheduled in April with [facility]  VISION ASSESSMENT: 08/03/23: -Smooth pursuits: reduced speed into all quadrants -Saccades: reduced speed horizontally and vertically -Visual fields/peripheral: appear WFL Pt reports occasional headaches when trying to read on her phone or watch tv for any more than 5-10 min at a time Requires reps of blinking with attempts at sustained visual attention (ie with visual assessment activities noted above)  PERCEPTION: Not tested  PRAXIS: [facility]  for tasks performed  TREATMENT DATE: 08/06/23:   Therapeutic Exercise:  -Pt. was provided with modified options for gross grip strengthening with yellow theraputty, and a blue resistive foam. -Progressive gross grip strengthening with 6.6# of grip strength resistive force on the right, and 11.2# on the left   Therapeutic Activity:  -visual search strategies, and visual scanning for tabletop tasks. Pt. completed a single letter search-simple in 1 min. & 51 sec. Pt. Initially utilized horizontal rectilinear visual search strategies for the 1st 2 rows, and utilized  disorganized scan patterns for the remainder for the task. -Visual scanning tasks using standard playing cards -Task was modified to reduce visual stimuli for visual scanning card matches from a single row at a time.   Neuromuscular re-education:   -Pt. worked on grasping, and manipulating 1", 3/4", and 1/2" washers from a magnetic dish using a 2pt. grasp pattern. Pt. worked on placing the washers over a small precise target on a horizontal dowel positioned at various angles.  -Facilitated [facility] skills grasping 1", 3/4", and 1/2" washers, moving them through the right hand, sorting, and stacking them.  -Facilitated right hand [facility] skills translatory movements storing washers in his hand and discarding them both from the tip of the thumb & 2nd digit, as well as discarding them out the ulnar aspect of the hand.       PATIENT EDUCATION: Education details: Visual assessment/compensation for visual fatigue Person educated: Patient Education method: Explanation Education comprehension: verbalized understanding, further training needed  HOME EXERCISE PROGRAM: Pink theraputty   GOALS: Goals reviewed with patient? Yes  SHORT TERM GOALS: Target date: 09/08/2023  Pt. Will be independent with HEPs for the RUE. Baseline: Eval: No current HEP Goal status: INITIAL  2.  Pt will be indep to verbalize 2-3 activities to target speed and activity tolerance with visual motor skills. Baseline: Eval: Visual motor HEP not yet initiated; pt demonstrates slow speed with smooth pursuits and saccades, and poor tolerance as noted by excessive blinking during visual assessment activities. Goal status: INITIAL  LONG TERM GOALS: Target date: 10/20/2023  Pt. Will improve bilateral shoulder ROM by 10 degrees to assist with ADL/IADLs Baseline: Eval: Shoulder flexion: R: 100(115), L: 161(096); abduction: R: 70(96), L: 122(138) Goal status: INITIAL  2.  Pt. Will improve bilateral UE strength by 2 mm grades to assist  with ADLs, and IADLs  Baseline: Eval: Shoulder flexion: R: 3-/5 L: 3-/5, abduction: R: 3-/5, L: 3-/5, elbow flexion: R: 4-/5, L: 4-/5, extension: R: 4-/5, L: 4-/5, wrist extension: R: 4-/5  L: 4-/5 Goal status: INITIAL  3.  Pt. Will improve right grip strength by 5# to be able to securely hold items. Baseline: Eval: R: 6#, L: 16#  Goal status: INITIAL  4.  Pt. Will improve right pinch strength by 3# to be able to efficiently open bottles, and containers Baseline: Lateral pinch: R: 8# L: 12#, 3 pt. pinch: R: 5#, L: 10# Goal status: INITIAL  5.  Pt. Will improve right hand [facility] skills by 3 sec. Of speed to be able to efficiently manipulate small objects Baseline: R: 26 sec., L: 22 sec. Goal status: INITIAL  6.  Pt. Will demonstrate visual compensatory strategies for ADL, and IADL tasks for both tabletop tasks, and tasks within her environment. Baseline: Eval: Education to be provided. Goal status: INITIAL  7.  Pt will tolerate reading a short magazine article with 1 or no rest breaks. Baseline: Pt reports she can tolerate reading on her phone or from a magazine for only ~  5 min before she must stop.  Pt occasionally reports a headache when reading or watching tv. Goal status: INITIAL   ASSESSMENT:  CLINICAL IMPRESSION:  Pt. reports having a follow-up eye appointment this afternoon. Pt. was provided with modifications to the  HEP for grip strengthening  blue resistive foam, and yellow level theraputty. Pt. to continue with pinch strengthening with the pink theraputty. Pt. required verbal cues, and cues for visual demonstration with the [facility] tasks. Pt. initially Utilized horizontal rectilinear visual search strategies, then switched to random, disorganized visual search strategies for most of the table top tasks. Pt. Requires tasks to be modified with decreased stimuli. Pt. Reports an "electrical" sensation in the right thumb when pressing down during pinch. Pt. reports noticing the sensation  today, as well as at home. Pt. continues to benefit from OT services to work towards improving RUE functioning, reviewing visual compensatory strategies, improving visual motor skills, and maximizing independence with ADLs, and IADL tasks.  PERFORMANCE DEFICITS: in functional skills including ADLs, IADLs, coordination, dexterity, proprioception, ROM, strength, Fine motor control, Gross motor control, and UE functional use, cognitive skills including memory, and psychosocial skills including environmental adaptation and routines and behaviors.   IMPAIRMENTS: are limiting patient from ADLs, IADLs, and leisure.   CO-MORBIDITIES: may have co-morbidities  that affects occupational performance. Patient will benefit from skilled OT to address above impairments and improve overall function.  MODIFICATION OR ASSISTANCE TO COMPLETE EVALUATION: Min-Moderate modification of tasks or assist with assess necessary to complete an evaluation.  OT OCCUPATIONAL PROFILE AND HISTORY: Detailed assessment: Review of records and additional review of physical, cognitive, psychosocial history related to current functional performance.  CLINICAL DECISION MAKING: Moderate - several treatment options, min-mod task modification necessary  REHAB POTENTIAL: Good  EVALUATION COMPLEXITY: Moderate    PLAN:  OT FREQUENCY: 2x/week  OT DURATION: 12 weeks  PLANNED INTERVENTIONS: 97168 OT Re-evaluation, 97535 self care/ADL training, 16109 therapeutic exercise, 97530 therapeutic activity, 97112 neuromuscular re-education, 97018 paraffin, 60454 moist heat, 97034 contrast bath, 97129 Cognitive training (first 15 min), 09811 Cognitive training(each additional 15 min), passive range of motion, functional mobility training, visual/perceptual remediation/compensation, patient/family education, and DME and/or AE instructions  RECOMMENDED OTHER SERVICES: PT, ST  CONSULTED AND AGREED WITH PLAN OF CARE: Patient  PLAN FOR NEXT SESSION:  Treatment  Duey Ghent, MS, OTR/L   08/06/2023, 4:35 PM

## 2023-08-11 ENCOUNTER — Encounter: Payer: 59 | Admitting: Occupational Therapy

## 2023-08-11 ENCOUNTER — Ambulatory Visit: Payer: 59

## 2023-08-11 ENCOUNTER — Ambulatory Visit: Admitting: Physical Therapy

## 2023-08-11 ENCOUNTER — Encounter: Admitting: Speech Pathology

## 2023-08-13 ENCOUNTER — Encounter: Admitting: Speech Pathology

## 2023-08-13 ENCOUNTER — Ambulatory Visit: Admitting: Occupational Therapy

## 2023-08-13 ENCOUNTER — Ambulatory Visit: Admitting: Physical Therapy

## 2023-08-13 ENCOUNTER — Encounter: Payer: 59 | Admitting: Occupational Therapy

## 2023-08-13 ENCOUNTER — Ambulatory Visit: Payer: 59

## 2023-08-18 ENCOUNTER — Ambulatory Visit: Admitting: Occupational Therapy

## 2023-08-18 ENCOUNTER — Encounter: Payer: 59 | Admitting: Occupational Therapy

## 2023-08-18 ENCOUNTER — Ambulatory Visit: Payer: 59

## 2023-08-18 ENCOUNTER — Ambulatory Visit

## 2023-08-20 ENCOUNTER — Ambulatory Visit: Payer: 59

## 2023-08-20 ENCOUNTER — Encounter: Payer: 59 | Admitting: Occupational Therapy

## 2023-08-25 ENCOUNTER — Telehealth: Payer: Self-pay

## 2023-08-25 ENCOUNTER — Ambulatory Visit: Attending: Physician Assistant

## 2023-08-25 ENCOUNTER — Ambulatory Visit: Payer: 59

## 2023-08-25 ENCOUNTER — Encounter: Payer: 59 | Admitting: Occupational Therapy

## 2023-08-25 NOTE — Telephone Encounter (Signed)
 Called pt to inform her of her missed therapy visit. 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 OT provided # and encouraged pt call this wk or next to get back on the schedule. Pt requested all therapy visits be cancelled this week.    Marcus Sewer, MS, OTR/L

## 2023-08-27 ENCOUNTER — Other Ambulatory Visit (HOSPITAL_COMMUNITY): Payer: Self-pay

## 2023-08-27 ENCOUNTER — Encounter: Payer: 59 | Admitting: Occupational Therapy

## 2023-08-27 ENCOUNTER — Ambulatory Visit: Payer: 59

## 2023-08-27 ENCOUNTER — Ambulatory Visit

## 2023-09-01 ENCOUNTER — Encounter: Payer: 59 | Admitting: Occupational Therapy

## 2023-09-01 ENCOUNTER — Ambulatory Visit: Payer: 59

## 2023-09-03 ENCOUNTER — Encounter: Payer: 59 | Admitting: Occupational Therapy

## 2023-09-03 ENCOUNTER — Ambulatory Visit: Payer: 59

## 2023-09-08 ENCOUNTER — Encounter: Payer: 59 | Admitting: Occupational Therapy

## 2023-09-08 ENCOUNTER — Ambulatory Visit: Payer: 59

## 2023-09-10 ENCOUNTER — Encounter: Payer: 59 | Admitting: Occupational Therapy

## 2023-09-10 ENCOUNTER — Ambulatory Visit: Payer: 59

## 2023-09-15 ENCOUNTER — Encounter: Payer: 59 | Admitting: Occupational Therapy

## 2023-09-15 ENCOUNTER — Ambulatory Visit: Payer: 59

## 2023-09-17 ENCOUNTER — Ambulatory Visit: Payer: 59

## 2023-09-17 ENCOUNTER — Encounter: Payer: 59 | Admitting: Occupational Therapy

## 2023-09-21 ENCOUNTER — Ambulatory Visit: Payer: 59 | Attending: Cardiology | Admitting: Cardiology

## 2023-09-22 ENCOUNTER — Ambulatory Visit: Payer: 59

## 2023-09-22 ENCOUNTER — Encounter: Payer: 59 | Admitting: Occupational Therapy

## 2023-09-24 ENCOUNTER — Ambulatory Visit: Payer: 59

## 2023-09-24 ENCOUNTER — Encounter: Payer: 59 | Admitting: Occupational Therapy

## 2023-09-29 ENCOUNTER — Ambulatory Visit: Payer: 59

## 2023-09-29 ENCOUNTER — Encounter: Payer: 59 | Admitting: Occupational Therapy

## 2023-10-01 ENCOUNTER — Ambulatory Visit: Payer: 59

## 2023-10-01 ENCOUNTER — Encounter: Payer: 59 | Admitting: Occupational Therapy

## 2023-10-06 ENCOUNTER — Encounter: Payer: 59 | Admitting: Occupational Therapy

## 2023-10-06 ENCOUNTER — Ambulatory Visit: Payer: 59

## 2023-10-08 ENCOUNTER — Encounter: Payer: 59 | Admitting: Occupational Therapy

## 2023-10-08 ENCOUNTER — Ambulatory Visit: Payer: 59

## 2023-10-13 ENCOUNTER — Ambulatory Visit: Payer: 59

## 2023-10-13 ENCOUNTER — Encounter: Payer: 59 | Admitting: Occupational Therapy

## 2023-10-15 ENCOUNTER — Ambulatory Visit: Payer: 59

## 2023-10-15 ENCOUNTER — Encounter: Payer: 59 | Admitting: Occupational Therapy

## 2023-10-20 ENCOUNTER — Ambulatory Visit: Payer: 59

## 2023-10-20 ENCOUNTER — Encounter: Payer: 59 | Admitting: Occupational Therapy

## 2023-10-22 ENCOUNTER — Encounter: Payer: 59 | Admitting: Occupational Therapy

## 2023-10-22 ENCOUNTER — Ambulatory Visit: Payer: 59

## 2023-10-27 ENCOUNTER — Ambulatory Visit: Payer: 59

## 2023-10-27 ENCOUNTER — Encounter: Payer: 59 | Admitting: Occupational Therapy

## 2023-10-28 ENCOUNTER — Encounter: Payer: Self-pay | Admitting: Internal Medicine

## 2023-10-28 ENCOUNTER — Ambulatory Visit: Attending: Internal Medicine | Admitting: Internal Medicine

## 2023-10-28 VITALS — BP 108/64 | HR 68 | Ht 59.0 in | Wt 191.5 lb

## 2023-10-28 DIAGNOSIS — I639 Cerebral infarction, unspecified: Secondary | ICD-10-CM

## 2023-10-28 MED ORDER — ATORVASTATIN CALCIUM 40 MG PO TABS: 40.0000 mg | ORAL_TABLET | Freq: Every day | ORAL | 0 refills | Status: AC

## 2023-10-28 NOTE — Patient Instructions (Addendum)
 Medication Instructions:  Your physician recommends that you continue on your current medications as directed. Please refer to the Current Medication list given to you today.   *If you need a refill on your cardiac medications before your next appointment, please call your pharmacy*  Lab Work: No labs ordered today   Testing/Procedures: No test ordered today   Follow-Up: At [facility], you and your health needs are our priority.  As part of our continuing mission to provide you with exceptional heart care, our providers are all part of one team.  This team includes your primary Cardiologist (physician) and Advanced Practice Providers or APPs (Physician Assistants and Nurse Practitioners) who all work together to provide you with the care you need, when you need it.  Your next appointment:   As needed  Provider:   Sammy Crisp, MD

## 2023-10-28 NOTE — Progress Notes (Signed)
 Cardiology Office Note:  .   Date:  10/28/2023  ID:  Christine Reyes, DOB 01-28-1956, MRN [id number] PCP: Antonio Baumgarten, MD  [facility] Providers Cardiologist:  None     History of Present Illness: .   Christine Reyes is a 68 y.o. female with history of stroke in 04/2023 thought to be embolic, provoked DVT following knee surgery, hypertension, hyperlipidemia, and obesity status post bariatric surgery, who has been referred for evaluation of cryptogenic (presumed to be embolic) stroke.  Her stroke manifested as sudden onset of dizziness and ultimate loss of consciousness.  When she came to, she had vision changes.  Brain MRI showed acute bilateral occipital lobe infarcts as well as additional punctate acute infarcts in the right cerebellum and possibly in the right lateral pons.  She previously wore an event monitor in 04/2023, which did not show any evidence of atrial fibrillation/flutter.  Concern remains for embolic stroke given multifocal/bilateral nature.  Today, Christine Reyes reports that she has been feeling fairly well.  She still has some occasional dizziness and vision problems, though her ophthalmologist believes that glaucoma may be contributing to this as well.  She has not had any new focal neurologic deficits.  She has been unable to participate in her rehabilitation for the last month due to transportation issues.  However, she is trying to do some exercises at home.  She denies chest pain, shortness of breath, and lightheadedness.  She occasionally notes palpitations when she is anxious but not at other times.  She has been compliant with her medications with the exception of atorvastatin  which she ran out of.  ROS: See HPI  Studies Reviewed: Aaron Aas   EKG Interpretation Date/Time:  Wednesday October 28 2023 10:20:40 EDT Ventricular Rate:  65 PR Interval:  168 QRS Duration:  80 QT Interval:  396 QTC Calculation: 411 R Axis:   24  Text Interpretation: Normal sinus rhythm  Normal ECG When compared with ECG of 08-May-2023 09:16, No significant change was found Confirmed by Ulmer Degen, Veryl Gottron [phone]) on 10/28/2023 10:23:44 AM    Event monitor (05/12/2023): Sinus rhythm with rare PACs and PVCs.  2 supraventricular runs lasting up to 7 beats noted.  No atrial fibrillation or other sustained arrhythmia.  TTE (05/12/2023): Normal LV size wall thickness.  LVEF 55-6% with normal wall motion and grade 1 diastolic dysfunction.  Normal RV size and function.  Normal biatrial size.  No pericardial effusion.  Trivial MR.  Mild TR.  Aortic sclerosis without stenosis.  Normal CVP.  Negative bubble study.   Risk Assessment/Calculations:             Physical Exam:   VS:  BP 108/64 (BP Location: Left Arm, Patient Position: Sitting, Cuff Size: Normal)   Pulse 68   Ht 4' 11 (1.499 m)   Wt 191 lb 8 oz (86.9 kg)   SpO2 95%   BMI 38.68 kg/m    Wt Readings from Last 3 Encounters:  10/28/23 191 lb 8 oz (86.9 kg)  06/30/23 195 lb (88.5 kg)  06/18/23 184 lb 9.6 oz (83.7 kg)    General:  NAD. Neck: No JVD or HJR. Lungs: Clear to auscultation bilaterally without wheezes or crackles. Heart: Regular rate and rhythm without murmurs, rubs, or gallops. Abdomen: Soft, nontender, nondistended. Extremities: Trace left pretibial edema.  ASSESSMENT AND PLAN: .    Cryptogenic stroke: No new neurologic deficits reported though Christine Reyes still has some vision and dizziness issues following her acute stroke in  December.  Given bilateral nature, embolic phenomenon remains a concern.  Echo with bubble study and 14-day event monitor were unrevealing.  I agree with Dr. Torrance Freestone recommendation for long-term monitoring.  Christine Reyes and I have discussed the rationale for implantable loop recorder placement and have agreed to refer her to electrophysiology for further discussion and implantation.  Continue secondary prevention with aspirin  and atorvastatin ; atorvastatin  refill provided today.  Follow-up  lipid panel and LFTs should be drawn in about 3 months.    Dispo: Follow-up with me on an as-needed basis.  Signed, Sammy Crisp, MD

## 2023-10-29 ENCOUNTER — Encounter: Payer: 59 | Admitting: Occupational Therapy

## 2023-10-29 ENCOUNTER — Ambulatory Visit: Payer: 59

## 2023-11-03 ENCOUNTER — Ambulatory Visit: Payer: 59

## 2023-11-03 ENCOUNTER — Encounter: Payer: 59 | Admitting: Occupational Therapy

## 2023-11-05 ENCOUNTER — Encounter: Payer: 59 | Admitting: Occupational Therapy

## 2023-11-05 ENCOUNTER — Ambulatory Visit: Payer: 59

## 2023-11-10 ENCOUNTER — Encounter: Payer: 59 | Admitting: Occupational Therapy

## 2023-11-10 ENCOUNTER — Ambulatory Visit: Payer: 59

## 2023-11-12 ENCOUNTER — Encounter: Payer: 59 | Admitting: Occupational Therapy

## 2023-11-12 ENCOUNTER — Ambulatory Visit: Payer: 59

## 2023-11-17 ENCOUNTER — Encounter: Payer: 59 | Admitting: Occupational Therapy

## 2023-11-17 ENCOUNTER — Ambulatory Visit: Payer: 59

## 2023-11-19 ENCOUNTER — Encounter: Payer: 59 | Admitting: Occupational Therapy

## 2023-11-19 ENCOUNTER — Ambulatory Visit: Payer: 59

## 2023-11-24 ENCOUNTER — Ambulatory Visit: Payer: 59

## 2023-11-24 ENCOUNTER — Encounter: Payer: 59 | Admitting: Occupational Therapy

## 2023-11-26 ENCOUNTER — Encounter: Payer: 59 | Admitting: Occupational Therapy

## 2023-11-26 ENCOUNTER — Ambulatory Visit: Payer: 59

## 2023-12-01 ENCOUNTER — Encounter: Payer: 59 | Admitting: Occupational Therapy

## 2023-12-01 ENCOUNTER — Ambulatory Visit: Payer: 59

## 2023-12-02 ENCOUNTER — Institutional Professional Consult (permissible substitution): Admitting: Cardiology

## 2023-12-03 ENCOUNTER — Encounter: Payer: 59 | Admitting: Occupational Therapy

## 2023-12-03 ENCOUNTER — Ambulatory Visit: Payer: 59

## 2023-12-08 ENCOUNTER — Ambulatory Visit: Payer: 59

## 2023-12-08 ENCOUNTER — Encounter: Payer: 59 | Admitting: Occupational Therapy

## 2023-12-10 ENCOUNTER — Encounter: Payer: 59 | Admitting: Occupational Therapy

## 2023-12-10 ENCOUNTER — Ambulatory Visit: Payer: 59

## 2023-12-26 NOTE — Progress Notes (Unsigned)
  Electrophysiology Office Note:    Date:  12/30/2023   ID:  Christine Reyes, DOB 1956/03/26, MRN [id number]  [facility] Cardiologist:  None  [facility] Electrophysiologist:  OLE ONEIDA HOLTS, MD   Referring MD: Mady Bruckner, MD   Chief Complaint: Cryptogenic stroke  History of Present Illness:    Christine Reyes is a 68 year old woman who I am seeing today for an evaluation of a cryptogenic stroke at the request of Dr. Mady. The patient last saw Dr. Mady October 28, 2023.  She has a history of DVT, hypertension, hyperlipidemia and obesity.  She has prior bariatric surgery.  She was admitted in December 2020 for with stroke symptoms.  No history of atrial fibrillation.  She is with her family today in clinic.  She tells me that she frequently misses doses of her blood pressure medication.  Her daughter confirms that.     Their past medical, social and family history was reviewed.   ROS:   Please see the history of present illness.    All other systems reviewed and are negative.  EKGs/Labs/Other Studies Reviewed:    The following studies were reviewed today:  June 19, 2023 ZIO monitor personally reviewed No atrial fibrillation  May 12, 2023 echo EF 55-60 RV normal Trivial MR        Physical Exam:    VS:  BP (!) 180/113   Pulse 68   Ht 4' 11 (1.499 m)   SpO2 92%   BMI 38.68 kg/m    Manual recheck 174/86  Wt Readings from Last 3 Encounters:  10/28/23 191 lb 8 oz (86.9 kg)  06/30/23 195 lb (88.5 kg)  06/18/23 184 lb 9.6 oz (83.7 kg)     GEN: no distress.  Obese CARD: RRR, No MRG RESP: No IWOB. CTAB.        ASSESSMENT AND PLAN:    1. Cerebrovascular accident (CVA), unspecified mechanism (HCC)   2. Primary hypertension      #Cryptogenic Stroke Pathophysiology of cryptogenic stroke was discussed in detail during today's clinic appointment. I discussed the role of loop recorder monitoring in patients who have suffered a CVA/TIA. There  has been no evidence of AF thus far in the patient's evaluation. Loop recorder monitors were discussed in detail including the implant procedure and its risks. I discussed the monthly monitoring costs associated with loop recorder monitoring.  After the discussion, the patient would like to check with her insurance company to make sure that the costs of the monthly monitoring are affordable.  She will let us  know if she wants to proceed.  I discussed alternatives including wearable monitors and even using a blood pressure cuff for monitoring her heart rates.  #Hypertension Above goal today.  Recommend checking blood pressures 1-2 times per week at home and recording the values.  Recommend bringing these recordings to the primary care physician. I have encouraged her to go home and take her blood pressure medication.  She should take it daily without missed doses.  Follow-up with EP on an as-needed basis or should she decide to proceed with loop recorder monitoring.    Signed, OLE ONEIDA. HOLTS, MD, [facility] 12/30/2023 12:19 PM    Electrophysiology [facility]

## 2023-12-28 ENCOUNTER — Encounter: Payer: Self-pay | Admitting: Neurology

## 2023-12-28 ENCOUNTER — Ambulatory Visit: Payer: 59 | Admitting: Neurology

## 2023-12-30 ENCOUNTER — Encounter: Payer: Self-pay | Admitting: Cardiology

## 2023-12-30 ENCOUNTER — Ambulatory Visit: Attending: Cardiology | Admitting: Cardiology

## 2023-12-30 VITALS — BP 180/113 | HR 68 | Ht 59.0 in

## 2023-12-30 DIAGNOSIS — I10 Essential (primary) hypertension: Secondary | ICD-10-CM

## 2023-12-30 DIAGNOSIS — I639 Cerebral infarction, unspecified: Secondary | ICD-10-CM

## 2023-12-30 NOTE — Patient Instructions (Signed)
 Medication Instructions:  Your physician recommends that you continue on your current medications as directed. Please refer to the Current Medication list given to you today.  *If you need a refill on your cardiac medications before your next appointment, please call your pharmacy*   Testing/Procedures: Loop Recorder Implant Codes: Implant - [id number], Monitoring - [id number]  Follow-Up: At [facility], you and your health needs are our priority.  As part of our continuing mission to provide you with exceptional heart care, our providers are all part of one team.  This team includes your primary Cardiologist (physician) and Advanced Practice Providers or APPs (Physician Assistants and Nurse Practitioners) who all work together to provide you with the care you need, when you need it.  Your next appointment:   Call us  if you would like to proceed with scheduling.

## 2023-12-31 ENCOUNTER — Telehealth: Payer: Self-pay | Admitting: Cardiology

## 2023-12-31 NOTE — Telephone Encounter (Signed)
  The patient said she spoke with her insurance and was informed that she doesn't have to pay anything for the ILR implant. She was told that her Medicaid ID is 100% covered.

## 2024-01-04 NOTE — Telephone Encounter (Signed)
 Spoke with the patient and scheduled her for a loop implant with Dr. Cindie.

## 2024-01-25 LAB — COLOGUARD

## 2024-01-27 ENCOUNTER — Ambulatory Visit: Admitting: Cardiology

## 2024-02-03 LAB — COLOGUARD

## 2024-02-10 ENCOUNTER — Encounter: Payer: Self-pay | Admitting: Cardiology

## 2024-02-10 ENCOUNTER — Ambulatory Visit: Attending: Cardiology | Admitting: Cardiology

## 2024-02-10 VITALS — BP 130/70 | HR 74 | Ht 59.0 in | Wt 194.0 lb

## 2024-02-10 DIAGNOSIS — I639 Cerebral infarction, unspecified: Secondary | ICD-10-CM | POA: Diagnosis not present

## 2024-02-10 NOTE — Progress Notes (Signed)
 Electrophysiology Office Follow up Visit Note:    Date:  02/10/2024   ID:  RAFAEL QUESADA, DOB March 17, 1956, MRN [id number]  PCP:  Sadie Manna, MD  [facility] Cardiologist:  None  [facility] Electrophysiologist:  OLE ONEIDA HOLTS, MD    Interval History:     BANNIE LOBBAN is a 68 y.o. female who presents for a follow up visit.   I last saw the patient December 30, 2023 for history of cryptogenic stroke.  We discussed using a loop recorder for atrial fibrillation surveillance and she was to reach out should she wish to proceed.  Today she presents to have her loop recorder implanted.        Past medical, surgical, social and family history were reviewed.  ROS:   Please see the history of present illness.    All other systems reviewed and are negative.  EKGs/Labs/Other Studies Reviewed:    The following studies were reviewed today:          Physical Exam:    VS:  There were no vitals taken for this visit.    Wt Readings from Last 3 Encounters:  10/28/23 191 lb 8 oz (86.9 kg)  06/30/23 195 lb (88.5 kg)  06/18/23 184 lb 9.6 oz (83.7 kg)     GEN: no distress CARD: RRR, No MRG RESP: No IWOB. CTAB.      ASSESSMENT:    No diagnosis found. PLAN:    In order of problems listed above:  #Cryptogenic Stroke Pathophysiology of cryptogenic stroke was discussed in detail during today's clinic appointment. I discussed the role of loop recorder monitoring in patients who have suffered a CVA/TIA. There has been no evidence of AF thus far in the patient's evaluation. Loop recorder monitors were discussed in detail including the implant procedure and its risks. I discussed the monthly monitoring costs associated with loop recorder monitoring. The patient would like to proceed with ILR implant.    Signed, OLE HOLTS, MD, [facility], [facility] 02/10/2024 3:07 PM    Electrophysiology [facility]  ---------------------  SURGEON:   OLE ONEIDA HOLTS, MD     PREPROCEDURE DIAGNOSIS:  Cryptogenic stroke    POSTPROCEDURE DIAGNOSIS: Cryptogenic stroke     PROCEDURES:   1. Implantable loop recorder implantation    INTRODUCTION:  AUBREYANNA DORROUGH presents with a history of cryptogenic stroke The costs of loop recorder monitoring have been discussed with the patient.    DESCRIPTION OF PROCEDURE:  Informed written consent was obtained.  A preprocedural timeout was performed with the RN [place]). The patient required no sedation for the procedure today.  Mapping over the patient's chest was performed to identify the area where electrograms were most prominent for ILR recording.  This area was found to be the left parasternal region over the 4th intercostal space. The patients left chest was therefore prepped and draped in the usual sterile fashion. The skin overlying the left parasternal region was infiltrated with lidocaine  for local analgesia.  A 0.5-cm incision was made over the left parasternal region over the 3rd intercostal space.  A subcutaneous ILR pocket was fashioned using a combination of sharp and blunt dissection.  A Med[employer]onic Reveal LINQ 2 implantable loop recorder was then placed into the pocket  R waves were very prominent and measured >0.28mV.  Steri- Strips and a sterile dressing were then applied.  There were no early apparent complications.     CONCLUSIONS:   1. Successful implantation of a implantable  loop recorder for Cryptogenic stroke  2. No early apparent complications.   Ole T. Cindie, MD, [facility], [facility] Cardiac Electrophysiology

## 2024-02-10 NOTE — Patient Instructions (Addendum)
Medication Instructions:  Your physician recommends that you continue on your current medications as directed. Please refer to the Current Medication list given to you today.  Labwork: None ordered.  Testing/Procedures: None ordered.  Follow-Up:  As needed with Dr. Lalla Brothers  Implantable Loop Recorder Placement, Care After This sheet gives you information about how to care for yourself after your procedure. Your health care provider may also give you more specific instructions. If you have problems or questions, contact your health care provider. What can I expect after the procedure? After the procedure, it is common to have: Soreness or discomfort near the incision. Some swelling or bruising near the incision.  Follow these instructions at home: Incision care  Monitor your cardiac device site for redness, swelling, and drainage. Call the device clinic at [phone] if you experience these symptoms or fever/chills.  Keep the large square bandage on your site for 24 hours and then you may remove it yourself. Keep the steri-strips underneath in place.   You may shower after 72 hours / 3 days from your procedure with the steri-strips in place. They will usually fall off on their own, or may be removed after 10 days. Pat dry.   Avoid lotions, ointments, or perfumes over your incision until it is well-healed.  Please do not submerge in water until your site is completely healed.   Your device is MRI compatible.   Remote monitoring is used to monitor your cardiac device from home. This monitoring is scheduled every month by our office. It allows [place] to keep an eye on the function of your device to ensure it is working properly.  If your wound site starts to bleed apply pressure.    For help with the monitor please call [employer] Monitor Support Specialist directly at [phone].    If you have any questions/concerns please call the device clinic at  [phone].  Activity  Return to your normal activities.  General instructions Follow instructions from your health care provider about how to manage your implantable loop recorder and transmit the information. Learn how to activate a recording if this is necessary for your type of device. You may go through a metal detection gate, and you may let someone hold a metal detector over your chest. Show your ID card if needed. Do not have an MRI unless you check with your health care provider first. Take over-the-counter and prescription medicines only as told by your health care provider. Keep all follow-up visits as told by your health care provider. This is important. Contact a health care provider if: You have redness, swelling, or pain around your incision. You have a fever. You have pain that is not relieved by your pain medicine. You have triggered your device because of fainting (syncope) or because of a heartbeat that feels like it is racing, slow, fluttering, or skipping (palpitations). Get help right away if you have: Chest pain. Difficulty breathing. Summary After the procedure, it is common to have soreness or discomfort near the incision. Change your dressing as told by your health care provider. Follow instructions from your health care provider about how to manage your implantable loop recorder and transmit the information. Keep all follow-up visits as told by your health care provider. This is important. This information is not intended to replace advice given to you by your health care provider. Make sure you discuss any questions you have with your health care provider. Document Released: 04/09/2015 Document Revised: 06/13/2017 Document Reviewed: 06/13/2017 [employer]vier Patient  Education  2020 [employer]  

## 2024-03-12 ENCOUNTER — Ambulatory Visit: Attending: Cardiology

## 2024-03-12 DIAGNOSIS — I639 Cerebral infarction, unspecified: Secondary | ICD-10-CM | POA: Diagnosis not present

## 2024-03-14 LAB — CUP PACEART REMOTE DEVICE CHECK
Date Time Interrogation Session: 20251101181030
Implantable Pulse Generator Implant Date: 20251001

## 2024-03-15 ENCOUNTER — Ambulatory Visit: Payer: Self-pay | Admitting: Cardiology

## 2024-03-16 NOTE — Progress Notes (Signed)
 Remote Loop Recorder Transmission

## 2024-03-28 ENCOUNTER — Ambulatory Visit

## 2024-04-12 ENCOUNTER — Ambulatory Visit

## 2024-04-12 DIAGNOSIS — I639 Cerebral infarction, unspecified: Secondary | ICD-10-CM | POA: Diagnosis not present

## 2024-04-13 LAB — CUP PACEART REMOTE DEVICE CHECK
Date Time Interrogation Session: 20251202181631
Implantable Pulse Generator Implant Date: 20251001

## 2024-04-15 NOTE — Progress Notes (Signed)
 Remote Loop Recorder Transmission

## 2024-04-17 ENCOUNTER — Ambulatory Visit: Payer: Self-pay | Admitting: Cardiology

## 2024-04-28 ENCOUNTER — Ambulatory Visit

## 2024-05-13 ENCOUNTER — Ambulatory Visit: Attending: Cardiology

## 2024-05-13 DIAGNOSIS — I639 Cerebral infarction, unspecified: Secondary | ICD-10-CM | POA: Diagnosis not present

## 2024-05-16 LAB — CUP PACEART REMOTE DEVICE CHECK
Date Time Interrogation Session: 20260102180943
Implantable Pulse Generator Implant Date: 20251001

## 2024-05-19 NOTE — Progress Notes (Signed)
 Remote Loop Recorder Transmission

## 2024-05-21 ENCOUNTER — Ambulatory Visit: Payer: Self-pay | Admitting: Cardiology

## 2024-05-30 ENCOUNTER — Ambulatory Visit

## 2024-06-13 ENCOUNTER — Ambulatory Visit

## 2024-06-14 LAB — CUP PACEART REMOTE DEVICE CHECK
Date Time Interrogation Session: 20260202181012
Implantable Pulse Generator Implant Date: 20251001

## 2024-06-30 ENCOUNTER — Ambulatory Visit

## 2024-07-14 ENCOUNTER — Ambulatory Visit

## 2024-08-01 ENCOUNTER — Ambulatory Visit

## 2024-08-14 ENCOUNTER — Ambulatory Visit

## 2024-09-14 ENCOUNTER — Ambulatory Visit
